# Patient Record
Sex: Female | Born: 1970 | Race: Black or African American | Hispanic: No | Marital: Single | Smoking: Never smoker
Health system: Southern US, Community
[De-identification: ages and names within clinical notes are randomized; demographics above are authoritative.]

## PROBLEM LIST (undated history)

## (undated) DIAGNOSIS — D649 Anemia, unspecified: Secondary | ICD-10-CM

## (undated) DIAGNOSIS — Z9851 Tubal ligation status: Secondary | ICD-10-CM

## (undated) DIAGNOSIS — M549 Dorsalgia, unspecified: Secondary | ICD-10-CM

## (undated) DIAGNOSIS — M25569 Pain in unspecified knee: Secondary | ICD-10-CM

## (undated) DIAGNOSIS — G43909 Migraine, unspecified, not intractable, without status migrainosus: Secondary | ICD-10-CM

## (undated) DIAGNOSIS — Z9889 Other specified postprocedural states: Secondary | ICD-10-CM

## (undated) HISTORY — DX: Migraine, unspecified, not intractable, without status migrainosus: G43.909

## (undated) HISTORY — PX: TUBAL LIGATION: SHX77

## (undated) HISTORY — DX: Anemia, unspecified: D64.9

## (undated) HISTORY — DX: Pain in unspecified knee: M25.569

## (undated) HISTORY — DX: Other specified postprocedural states: Z98.890

## (undated) HISTORY — DX: Tubal ligation status: Z98.51

## (undated) HISTORY — PX: BREAST SURGERY: SHX581

---

## 2003-09-30 ENCOUNTER — Ambulatory Visit (HOSPITAL_COMMUNITY): Admission: RE | Admit: 2003-09-30 | Discharge: 2003-09-30 | Payer: Self-pay | Admitting: Obstetrics and Gynecology

## 2003-11-15 ENCOUNTER — Ambulatory Visit (HOSPITAL_COMMUNITY): Admission: RE | Admit: 2003-11-15 | Discharge: 2003-11-15 | Payer: Self-pay | Admitting: Obstetrics and Gynecology

## 2003-11-22 ENCOUNTER — Inpatient Hospital Stay (HOSPITAL_COMMUNITY): Admission: AD | Admit: 2003-11-22 | Discharge: 2003-11-27 | Payer: Self-pay | Admitting: Obstetrics & Gynecology

## 2003-11-24 ENCOUNTER — Encounter (INDEPENDENT_AMBULATORY_CARE_PROVIDER_SITE_OTHER): Payer: Self-pay | Admitting: *Deleted

## 2003-11-28 ENCOUNTER — Encounter: Admission: RE | Admit: 2003-11-28 | Discharge: 2003-12-28 | Payer: Self-pay | Admitting: Obstetrics & Gynecology

## 2004-01-10 ENCOUNTER — Other Ambulatory Visit: Admission: RE | Admit: 2004-01-10 | Discharge: 2004-01-10 | Payer: Self-pay | Admitting: Obstetrics & Gynecology

## 2005-03-08 ENCOUNTER — Emergency Department (HOSPITAL_COMMUNITY): Admission: EM | Admit: 2005-03-08 | Discharge: 2005-03-08 | Payer: Self-pay | Admitting: Emergency Medicine

## 2005-04-20 ENCOUNTER — Ambulatory Visit (HOSPITAL_COMMUNITY): Admission: RE | Admit: 2005-04-20 | Discharge: 2005-04-20 | Payer: Self-pay | Admitting: Obstetrics & Gynecology

## 2006-12-23 ENCOUNTER — Emergency Department (HOSPITAL_COMMUNITY): Admission: EM | Admit: 2006-12-23 | Discharge: 2006-12-23 | Payer: Self-pay | Admitting: Emergency Medicine

## 2006-12-24 ENCOUNTER — Ambulatory Visit: Payer: Self-pay | Admitting: Cardiology

## 2007-02-17 ENCOUNTER — Encounter (INDEPENDENT_AMBULATORY_CARE_PROVIDER_SITE_OTHER): Payer: Self-pay | Admitting: Specialist

## 2007-02-17 ENCOUNTER — Ambulatory Visit (HOSPITAL_BASED_OUTPATIENT_CLINIC_OR_DEPARTMENT_OTHER): Admission: RE | Admit: 2007-02-17 | Discharge: 2007-02-18 | Payer: Self-pay | Admitting: Specialist

## 2008-02-09 ENCOUNTER — Emergency Department (HOSPITAL_COMMUNITY): Admission: EM | Admit: 2008-02-09 | Discharge: 2008-02-10 | Payer: Self-pay | Admitting: Emergency Medicine

## 2008-03-09 ENCOUNTER — Ambulatory Visit: Payer: Self-pay | Admitting: Gastroenterology

## 2009-02-25 ENCOUNTER — Ambulatory Visit (HOSPITAL_COMMUNITY): Admission: RE | Admit: 2009-02-25 | Discharge: 2009-02-25 | Payer: Self-pay | Admitting: Obstetrics & Gynecology

## 2010-06-27 LAB — CBC
HCT: 35.7 % — ABNORMAL LOW (ref 36.0–46.0)
Hemoglobin: 11.4 g/dL — ABNORMAL LOW (ref 12.0–15.0)
MCHC: 32 g/dL (ref 30.0–36.0)
MCV: 77 fL — ABNORMAL LOW (ref 78.0–100.0)
Platelets: 309 10*3/uL (ref 150–400)
RBC: 4.64 MIL/uL (ref 3.87–5.11)
RDW: 15.8 % — ABNORMAL HIGH (ref 11.5–15.5)
WBC: 8.4 10*3/uL (ref 4.0–10.5)

## 2010-06-27 LAB — PREGNANCY, URINE: Preg Test, Ur: NEGATIVE

## 2010-08-08 NOTE — Op Note (Signed)
NAMEVIRGIE, Christina Sparks               ACCOUNT NO.:  [id number]   MEDICAL RECORD NO.:  [id number]          PATIENT TYPE:  AMB   LOCATION:  DSC                          FACILITY:  [facility]   PHYSICIAN:  Earvin Hansen L. Truesdale, M.D.DATE OF BIRTH:  28-Oct-1970   DATE OF PROCEDURE:  02/17/2007  DATE OF DISCHARGE:                               OPERATIVE REPORT   A 36-year lady with severe severe macromastia, back and  shoulder pain  secondary to large pendulous breasts, increased accessory breast tissue,  aberrant tissue on the right and left axillary and latissimus dorsi  areas and serratus anterior, all causing intertriginous changes and  discomfort.   PROCEDURES PLANNED:  Bilateral breast reductions using the inferior  pedicle technique,  excision of accessory breast tissue.   ANESTHESIA:  General.   The patient underwent general anesthesia and intubated orally after she  had been set up and drawn for the inferior pedicle reduction  mammoplasty, remarking the nipple-areolar complexes back up to 25 cm  from the suprasternal notch where they measured preoperatively 40-41.  After being intubated, a prep was done to the breast, chest areas and  abdomen using Hibiclens soap and solution and walled off with sterile  towels and drapes so as to make a sterile field.  One-quarter percent  Xylocaine 1:400,000 concentration was used to locally inject for  vasoconstriction, a total of 150 mL per side.  The wounds were scored  with a #15 blade and the skin of the inferior pedicle was de-  epithelialized with #20 blades.  Medial and lateral fatty dermal  pedicles were incised down to underlying pectoralis major fascia.  Hemostasis was maintained with the Bovie unit of coagulation.  Next the  key hole area was also debulked and out laterally tremendous amounts of  aberrant accessory breast tissue was excised directly in the areas  mentioned in the preoperative evaluation.  The wounds were then examined  and  Hemostasis maintained with the Bovie unit of coagulation.  The flaps  were then stayed with 3-0 Prolene suture, subcutaneous closure was done  with 3-0 Monocryl x2 layers and then running subcuticular stitches of 3-  0 Monocryl and 5-0 Monocryl throughout the inverted T. The wounds were  drained with #10 fully fluted Blake drains which were placed in the  depths of the wound and brought out through the lateral-most portion of  the incision and secured with 3-0 Prolene. The wounds were cleansed.  Examination of the nipple-areolar complexes were done showing good blood  supply and color. Steri-Strips and soft dressings were applied including  Xeroform, 4x4s, ABDs, and Hypafix tape.  She withstood the procedure  very well.   ESTIMATED BLOOD LOSS:  Less than 150 mL.   COMPLICATIONS:  None.      Yaakov Guthrie. Shon Hough, M.D.  Electronically Signed     GLT/MEDQ  D:  02/17/2007  T:  02/17/2007  Job:  [id number]

## 2010-08-08 NOTE — Assessment & Plan Note (Signed)
[facility]                            CARDIOLOGY OFFICE NOTE   NAME:Christina Sparks, Christina Sparks                      MRN:          [id number]  DATE:12/24/2006                            DOB:          03/12/1971    Ms. Christina Sparks is a 40 year old single or divorced white female who comes  today after being in the emergency room yesterday with chest pain,  dizziness and a headache.   This has been episodic and has been going on for 4 months.  Her  discomfort in her chest is described as a heaviness.  It is not exertion  related.  Sometimes she feels a little bit of tingling down into her  left arm.   Sometimes she has a little bit of dizziness, this is episodic as well.  No true vertigo.  She has had a dull aching headache at times.   She is concerned that it is coronary because of her family history of  premature heart disease.  When she is questioned today it is really  mostly in second degree relatives and not first degree relatives.  Her  father did have an MI and did have some coronary disease in his 30s.   She is not diabetic, she does not have hypertension.  She is overweight.  She does not know her lipid status.  She does not smoke.   Her laboratory data in the emergency room showed a normal chemistry  profile including a normal blood sugar, normal point of care markers,  negative d-dimer, negative chest x-ray and a normal EKG.  She was  discharged home.   PAST MEDICAL HISTORY:  1. NO DYE ALLERGY.  2. She is currently on no medications.   She does not smoke, drink, use any recreational products.  She drinks  very little caffeine.  She has had no surgery.   FAMILY HISTORY:  Negative for premature heart disease.   SOCIAL HISTORY:  Is a Emergency planning/management officer.  She says she has a good job,  just bought a house.  She is not in the [employer].  She has 3  children, one of which is with her today.  She says her stress level has  been adequate and not  over stressed.   REVIEW OF SYSTEMS:  Negative other than just chronic fatigue and  depression.   EXAMINATION:  She is very pleasant.  She is alert and oriented.  Blood  pressure is 112/74, pulse 84 and regular, she weight 271, she is 5 foot  7-1/2.  She is in no acute distress.  SKIN:  Warm and dry.  HEENT:  Normocephalic/atraumatic, PERRLA, extraocular movements intact,  sclerae are clear, her facial symmetry is normal.  Carotid upstrokes  were equal bilaterally without bruits, no JVD, thyroid is not enlarged,  trachea is midline.  LUNGS:  Clear.  HEART:  Reveals a nondisplaced PMI, has normal S1 and S2 without murmur,  rub or gallop.  ABDOMINAL:  Obese with good bowel sounds, organomegaly could not be  assessed.  EXTREMITIES:  Reveal no cyanosis, clubbing or edema.  Peripheral pulses  are  intact.  NEURO:  Grossly intact.   EKG is completely normal.   ASSESSMENT:  1. Noncardiac chest discomfort, dizziness.  2. Obesity.  3. Question lipid status.   She would like me to check fasting lipids.  We will put her in the  computer for this.  I have advised her that if she continues to have  symptoms, particularly headaches, she needs to get this further  evaluated either going back to the emergency room, establishment with a  primary care physician or neurologist.     Jesse Sans. Daleen Squibb, MD, [facility]  Electronically Signed    TCW/MedQ  DD: 12/24/2006  DT: 12/25/2006  Job #: [id number]   cc:   Doug Sou, M.D.

## 2010-08-08 NOTE — Assessment & Plan Note (Signed)
[facility]                                 ON-CALL NOTE   NAME:Szilagyi, Christina Sparks                      MRN:          [id number]  DATE:12/23/2006                            DOB:          24-Jan-1971    PHONE NUMBER:  [phone].   Dr. Ethelda Chick called today noting that we were on call for the  emergency room.  He is seeing a young woman with some chest discomfort.  He feels that it is most likely not cardiac, and that she does not need  to stay in the emergency room.  He feels that she does need cardiology  assessment in followup as an outpatient.  We will call the patient and  arrange for an outpatient cardiac evaluation.     Luis Abed, MD, [facility]  Electronically Signed    JDK/MedQ  DD: 12/23/2006  DT: 12/23/2006  Job #: [id number]

## 2010-08-11 NOTE — Op Note (Signed)
NAME:  KRISS, PERLEBERG NO.:  [id number]   MEDICAL RECORD NO.:  [id number]          PATIENT TYPE:  AMB   LOCATION:  SDC                           [facility]   PHYSICIAN:  Roseanna Rainbow, M.D.DATE OF BIRTH:  11-Nov-1970   DATE OF PROCEDURE:  04/20/2005  DATE OF DISCHARGE:                                 OPERATIVE REPORT   PREOPERATIVE DIAGNOSIS:  Multiparity, desires a sterilization procedure.   POSTOPERATIVE DIAGNOSIS:  Multiparity, desires a sterilization procedure.   PROCEDURE:  Laparoscopic bilateral tubal ligation with bipolar cautery.   SURGEON:  Roseanna Rainbow, M.D.   ANESTHESIA:  General endotracheal.   ESTIMATED BLOOD LOSS:  Minimal.   COMPLICATIONS:  None.   IV FLUIDS:  As per anesthesiology.   URINE OUTPUT:  100 mL clear urine at the beginning of the procedure.   PROCEDURE:  The patient was taken to the operating room.  General anesthesia  was administered without difficulty.  She was placed in the dorsal lithotomy  position and prepped and draped in the usual sterile fashion.  A bivalve  speculum was placed in the patient's vagina.  A single-tooth tenaculum was  applied to the anterior lip of the cervix.  A Hulka manipulator was then  advanced into the uterus and secured to the anterior lip of the cervix as a  means to manipulate the uterus.  The speculum and single-tooth tenaculum was  then removed.  An infraumbilical skin incision was then made with the  scalpel and carried down to the underlying fascia.  The fascia was incised  and the fascial incision was extended.  The posterior rectus sheath and  parietal peritoneum were tented up and entered sharply.  This incision again  was extended.  Stay sutures were placed using of Vicryl in the fascia.  The  Hasson trocar and sleeve were then advanced into the abdomen.  The abdomen  was then insufflated with CO2 gas.  A survey of the pelvic anatomy was  remarkable for filmy  adhesions involving the omentum to the anterior uterine  wall.  The midisthmic portion of the left tube was then cauterized  contiguously.  A 2 cm segment of tube was cauterized.  With each  application, the ohmmeter was noted to go to 0.  The right fallopian tube  was manipulated in a similar fashion.  The instruments were removed from the  abdomen.  The fascial incision was reapproximated with  0 Vicryl.  The skin was reapproximated with 3-0 Vicryl and Dermabond.  The  Hulka manipulator was removed with minimal bleeding noted from the cervix.  At the close of the procedure, the instrument and pack counts were said to  be correct x2.  The patient was taken to the PACU awake and in stable  condition.      Roseanna Rainbow, M.D.  Electronically Signed     LAJ/MEDQ  D:  04/20/2005  T:  04/20/2005  Job:  [id number]

## 2010-08-11 NOTE — Op Note (Signed)
NAME:  Christina Sparks, Christina Sparks                         ACCOUNT NO.:  [id number]   MEDICAL RECORD NO.:  [id number]                   PATIENT TYPE:  INP   LOCATION:  9152                                 [facility]ACILITY:  [facility]   PHYSICIAN:  Gerrit Friends. Aldona Bar, M.D.                DATE OF BIRTH:  06/18/1970   DATE OF PROCEDURE:  11/24/2003  DATE OF DISCHARGE:                                 OPERATIVE REPORT   PREOPERATIVE DIAGNOSES:  1.  Severe preeclampsia.  2.  A 30-week intrauterine pregnancy.  3.  Abnormal Doppler flow study.  4.  Nonreassuring fetal heart tracing.   POSTOPERATIVE DIAGNOSES:  1.  Severe preeclampsia.  2.  A 30-week intrauterine pregnancy.  3.  Abnormal Doppler flow study.  4.  Nonreassuring fetal heart tracing.   1.  Delivery of 2 pound 8 ounce female infant with Apgars 4 and 7 and arterial      cord pH 7.27.   PROCEDURE:  Primary low transverse cesarean section.   SURGEON:  Gerrit Friends. Aldona Bar, M.D.   ANESTHESIA:  Subarachnoid block per Quillian Quince, M.D.   INDICATIONS FOR PROCEDURE:  This 40 year old gravida 3, para 2 was admitted  on November 22, 2003, with headache and elevated blood pressure and a  complaint of decreased fetal movement.  During the course of her  hospitalization, her blood pressures stabilized.  Her headache was treated  and resolved.  She had a biophysical profile that was 8 of 8 and she had a  fetal tracing that was somewhat reassuring but intermittently she did have  significant variable decelerations.   On the evening of November 23, 2003, Doppler studies were done and revealed no  end diastolic flow in the uterine artery and the middle cerebral artery  was  less than its normal value as well.  A 24-hour creatinine clearance and  total urine protein was sent on November 23, 2003, and preoperatively the  value of her total urinary protein was 2.3 g.  When I saw the patient at  7:30 on the morning of November 24, 2003, all these factors were noted.  Dr.  Gavin Potters was consulted and agreed with immanent delivery even though the  patient had received only one dose of betamethasone on the evening of November 23, 2003.  Accordingly, the patient was taken to the operating room now for  delivery.   The patient's platelet count was checked again.  It was found to be normal  and once in the operating room, a subarachnoid block was carried out by Dr.  Arby Barrette without difficulty.   At this time, the patient was prepped and draped having been placed in the  supine position slightly tilted to the left and a Foley catheter was placed  as part of the prep.  After good anesthetic levels were documented with the  patient adequately draped, the procedure was begun.  A Pfannenstiel incision  was made and with minimal difficulty, dissected down sharply to and through  the fascia with hemostasis created at each layer.  Subfascial space was  created inferiorly and superiorly.  Muscles separated in the midline.  Peritoneum identified and entered appropriately with care taken to avoid the  bowel superiorly and the bladder inferiorly.  At this time, the  vesicouterine peritoneum was incised in a low transverse fashion and pushed  off the lower uterine segment.  A sharp incision to the uterus in the low  transverse fashion was then made and extended laterally - incision was made  with Metzenbaum scissors.  Amniotomy was produced.  Placenta was encountered  as it was anterior as well.  After amniotomy was carried out, both feet of  the fetus were grasped - infant was known to be breech, and using the  modified Lovset maneuver, infant was delivered from a footling breech  presentation.  The cord was clamped and cut and the infant was passed off to  the awaiting team.  Subsequent weight found to be 2 pounds 8 ounces and  Apgars were assigned at 4 and 7.  An arterial cord pH was done and returned  at 7.27.   An attempt was made to obtain normal cord bloods but there  was no blood in  the cord to allow this.  At this time, the placenta was delivered and again  no blood could be obtained from the placenta either.  The placenta was sent  to pathology and appropriately labeled.   At this time the uterus was exteriorized and manually rendered free of any  remaining products of conception and with good uterine contractility  afforded, was slowly given intravenous Pitocin and manual stimulation.  Closure of the uterus was begun in layers.  The #1 Vicryl in running locking  fashion was used and thereafter several figure-of-eight #1 Vicryls were  placed for additional hemostasis.  The uterine incision appeared dry. The  bladder flap was reapproximated with interrupted 0 Vicryl.  Tubes and  ovaries were inspected and noted to be normal.  The uterus was well  contracted.  At this time, after the abdomen was lavaged of all free blood  and clot, the uterus was replaced in the abdominal cavity.  All counts were  noted to be correct at this point and no foreign body was noted to be  remaining in the abdominal cavity and accordingly, closure of the abdomen  was carried out at this time.  The abdominal peritoneum was closed with 0  Vicryl in a running fashion and muscle secured with same.  Assured of good  subfascial hemostasis, as mentioned the fascia was closed with 0 Vicryl from  angle to midline bilaterally.  Subcutaneous tissue was rendered hemostatic.  Staples were then used to close the skin.  A sterile compressive dressing  was applied and the patient, at this time, was transported to the recovery  room in satisfactory condition having tolerated the procedure well.  The  infant was transported to the newborn intensive care unit.  Mother was  transported to the recovery room.  At the conclusion of the procedure, both  were stable in their respective recovery areas.  SUMMARY:  This patient was delivered at [redacted] weeks gestation because of severe  preeclampsia,  abnormal Doppler studies and a nonreassuring fetal heart  tracing in conjunction with a 24-hour urine total protein that was 2.3 g.  She was delivered of a 2 pound 8 ounce female infant with  Apgars of 4 and 7.                                               Gerrit Friends. Aldona Bar, M.D.    RMW/MEDQ  D:  11/24/2003  T:  11/24/2003  Job:  [id number]

## 2010-08-11 NOTE — Discharge Summary (Signed)
NAME:  Christina Sparks, CLOUATRE NO.:  [id number]   MEDICAL RECORD NO.:  [id number]          PATIENT TYPE:  INP   LOCATION:  9109                          [facility]ACILITY:  [facility]   PHYSICIAN:  Miguel Aschoff, M.D.       DATE OF BIRTH:  April 14, 1970   DATE OF ADMISSION:  11/22/2003  DATE OF DISCHARGE:  11/27/2003                                 DISCHARGE SUMMARY   FINAL DIAGNOSES:  1.  Intrauterine pregnancy at [redacted] weeks gestation.  2.  Severe preeclampsia.  3.  Abnormal Doppler flow studies.  4.  Nonreassuring fetal heart tracing.   PROCEDURE:  Primary low transverse cesarean section.  Surgeon:  Dr. Annamaria Helling.   This 40 year old G3 P2 presents at [redacted] weeks gestation on November 22, 2003 at  the office with headaches moderate to severe and a complaint of decreased  fetal movement.  In the office, blood pressure was 140/90, there was 3+  protein in the urine.  The patient was sent immediately to triage for  observation.  Her cervix was 0/0 at this point and fetal heart tones were  nonreactive.  Blood pressure in triage was about 160/90.  There was a BPP  performed that was 8/8 and PIH labs were normal.  There was a breech  presentation as well.  During her hospital course her blood pressure  stabilized.  Labs were watched.  On the evening of August 30, Doppler  studies were performed and revealed no end diastolic flow into the uterine  artery and the middle cerebral artery was less than its normal value.  A 24-  hour creatinine clearance and total urine protein was sent and total protein  of 2.3 g.  On the morning of August 31, Dr. Aldona Bar saw the patient, Dr.  Gavin Potters was consulted, and agreed with an imminent delivery even though the  patient had only had one dose of betamethasone.  The patient's platelet  count was checked at this point, found to be normal, and therefore was able  to be taken to the operating room for a cesarean section.  The patient's  antepartum course up to this point  had really been uncomplicated up to this  30-week period.  Actually, on August 22 she was complaining of some swelling  and increased pressure and decreased fetal movement.  She was sent to  [facility] at that time where NST was reactive and so was a BPP with  8/8.  She was resting at this time.  When she returned the next week was  when the patient was actually hospitalized.  The patient was taken to the  operating room on November 24, 2003 by Dr. Annamaria Helling where a primary low  transverse cesarean section was performed with delivery of a 2-pound 8-ounce  female infant with Apgars of 4 and 7.  There was an arterial cord pH of 7.27.  The delivery went without complication.  The patient was, of course, taken  to the ICU because she was on her magnesium sulfate.  The baby was in the  NICU on CPAP but  doing well.  By postoperative day #1 the patient was  feeling better.  She was not having any headaches or any problems.  Her  magnesium sulfate was able to be stopped and she was taken to the regular  mother-baby unit.  Blood pressures continued to be elevated and the patient  was started on some labetalol for her blood pressures.  She was felt ready  for discharge on postoperative day #3.  She was to take labetalol twice  daily for her blood pressures, Tylox one q.3h. as needed for pain, told she  could use over-the-counter Motrin as needed, was asked to follow up  in the  office in 2 weeks for a blood pressure check, of course was to call with any  increased pain or bleeding.   LABORATORY DATA ON DISCHARGE:  The patient had a hemoglobin of 10.6; white  blood cell count of 20.8; platelets of 229,000.  Normal PIH labs.      MB/MEDQ  D:  12/21/2003  T:  12/21/2003  Job:  [id number]

## 2010-10-16 ENCOUNTER — Other Ambulatory Visit: Payer: Self-pay | Admitting: Obstetrics

## 2010-12-26 LAB — URINALYSIS, ROUTINE W REFLEX MICROSCOPIC
Bilirubin Urine: NEGATIVE
Glucose, UA: NEGATIVE
Hgb urine dipstick: NEGATIVE
Ketones, ur: NEGATIVE
Nitrite: NEGATIVE
Protein, ur: NEGATIVE
Specific Gravity, Urine: 1.023
Urobilinogen, UA: 1
pH: 7

## 2010-12-26 LAB — CBC
HCT: 35.6 — ABNORMAL LOW
Hemoglobin: 11.3 — ABNORMAL LOW
MCHC: 31.9
MCV: 75.6 — ABNORMAL LOW
Platelets: 306
RBC: 4.7
RDW: 15.1
WBC: 10.2

## 2010-12-26 LAB — DIFFERENTIAL
Basophils Absolute: 0.1
Basophils Relative: 1
Eosinophils Absolute: 0.1
Eosinophils Relative: 1
Lymphocytes Relative: 41
Lymphs Abs: 4.2 — ABNORMAL HIGH
Monocytes Absolute: 0.8
Monocytes Relative: 8
Neutro Abs: 5
Neutrophils Relative %: 49

## 2010-12-26 LAB — POCT I-STAT, CHEM 8
BUN: 5 — ABNORMAL LOW
Calcium, Ion: 1.21
Chloride: 103
Creatinine, Ser: 0.9
Glucose, Bld: 92
HCT: 37
Hemoglobin: 12.6
Potassium: 4
Sodium: 140
TCO2: 28

## 2010-12-26 LAB — POCT CARDIAC MARKERS
CKMB, poc: <1 — ABNORMAL LOW
Myoglobin, poc: 51.5
Troponin i, poc: <0.05

## 2010-12-26 LAB — PREGNANCY, URINE: Preg Test, Ur: NEGATIVE

## 2011-01-02 LAB — POCT HEMOGLOBIN-HEMACUE
Hemoglobin: 11.1 — ABNORMAL LOW
Operator id: 116011

## 2011-01-04 LAB — I-STAT 8, (EC8 V) (CONVERTED LAB)
Acid-Base Excess: 1
BUN: 7
Bicarbonate: 27.4 — ABNORMAL HIGH
Chloride: 103
Glucose, Bld: 102 — ABNORMAL HIGH
HCT: 39
Hemoglobin: 13.3
Operator id: 114141
Potassium: 3.9
Sodium: 138
TCO2: 29
pCO2, Ven: 49.6
pH, Ven: 7.35 — ABNORMAL HIGH

## 2011-01-04 LAB — POCT I-STAT CREATININE
Creatinine, Ser: 0.7
Operator id: 114141

## 2011-01-04 LAB — POCT CARDIAC MARKERS
CKMB, poc: <1 — ABNORMAL LOW
Myoglobin, poc: 66.1
Operator id: 114141
Troponin i, poc: <0.05

## 2011-01-04 LAB — D-DIMER, QUANTITATIVE (NOT AT ARMC): D-Dimer, Quant: 0.28

## 2011-04-14 ENCOUNTER — Encounter (HOSPITAL_COMMUNITY): Payer: Self-pay

## 2011-04-14 ENCOUNTER — Emergency Department (HOSPITAL_COMMUNITY)
Admission: EM | Admit: 2011-04-14 | Discharge: 2011-04-14 | Disposition: A | Discharge status: Home / Self Care | Payer: BC Managed Care – HMO | Attending: Emergency Medicine | Admitting: Emergency Medicine

## 2011-04-14 ENCOUNTER — Emergency Department (HOSPITAL_COMMUNITY): Payer: BC Managed Care – HMO

## 2011-04-14 ENCOUNTER — Other Ambulatory Visit: Payer: Self-pay

## 2011-04-14 DIAGNOSIS — R079 Chest pain, unspecified: Secondary | ICD-10-CM | POA: Insufficient documentation

## 2011-04-14 DIAGNOSIS — R0602 Shortness of breath: Secondary | ICD-10-CM | POA: Insufficient documentation

## 2011-04-14 DIAGNOSIS — D649 Anemia, unspecified: Secondary | ICD-10-CM

## 2011-04-14 DIAGNOSIS — R51 Headache: Secondary | ICD-10-CM

## 2011-04-14 HISTORY — DX: Dorsalgia, unspecified: M54.9

## 2011-04-14 LAB — POCT I-STAT, CHEM 8
BUN: 7 mg/dL (ref 6–23)
Calcium, Ion: 1.25 mmol/L (ref 1.12–1.32)
Chloride: 103 mEq/L (ref 96–112)
Creatinine, Ser: 0.8 mg/dL (ref 0.50–1.10)
Glucose, Bld: 99 mg/dL (ref 70–99)
HCT: 32 % — ABNORMAL LOW (ref 36.0–46.0)
Hemoglobin: 10.9 g/dL — ABNORMAL LOW (ref 12.0–15.0)
Potassium: 4.5 mEq/L (ref 3.5–5.1)
Sodium: 141 mEq/L (ref 135–145)
TCO2: 28 mmol/L (ref 0–100)

## 2011-04-14 LAB — D-DIMER, QUANTITATIVE: D-Dimer, Quant: 1.54 ug/mL-FEU — ABNORMAL HIGH (ref 0.00–0.48)

## 2011-04-14 MED ORDER — OXYCODONE-ACETAMINOPHEN 5-325 MG PO TABS
1.0000 | ORAL_TABLET | Freq: Once | ORAL | Status: AC
  Administered 2011-04-14: 1 via ORAL

## 2011-04-14 MED ORDER — KETOROLAC TROMETHAMINE 30 MG/ML IJ SOLN
30.0000 mg | Freq: Once | INTRAMUSCULAR | Status: AC
  Administered 2011-04-14: 30 mg via INTRAVENOUS
  Filled 2011-04-14: qty 1

## 2011-04-14 MED ORDER — METHYLPREDNISOLONE SODIUM SUCC 125 MG IJ SOLR
125.0000 mg | Freq: Once | INTRAMUSCULAR | Status: AC
  Administered 2011-04-14: 125 mg via INTRAVENOUS
  Filled 2011-04-14: qty 2

## 2011-04-14 MED ORDER — OXYCODONE-ACETAMINOPHEN 5-325 MG PO TABS
1.0000 | ORAL_TABLET | Freq: Once | ORAL | Status: AC
  Administered 2011-04-14: 1 via ORAL
  Filled 2011-04-14: qty 2

## 2011-04-14 MED ORDER — SODIUM CHLORIDE 0.9 % IV BOLUS (SEPSIS)
1000.0000 mL | Freq: Once | INTRAVENOUS | Status: AC
  Administered 2011-04-14: 1000 mL via INTRAVENOUS

## 2011-04-14 MED ORDER — DIPHENHYDRAMINE HCL 50 MG/ML IJ SOLN
25.0000 mg | Freq: Once | INTRAMUSCULAR | Status: AC
  Administered 2011-04-14: 25 mg via INTRAVENOUS
  Filled 2011-04-14: qty 1

## 2011-04-14 NOTE — ED Notes (Signed)
Pt here with c/o chest tightness since this am with sob and HA, pt states she states she has sensation that her heart is fluttering and when she takes deep breathes and calms down it is followed by increase in headache.

## 2011-04-14 NOTE — ED Provider Notes (Signed)
History     CSN: [id number]  Arrival date & time 04/14/11  1813   First MD Initiated Contact with Patient 04/14/11 1850      Chief Complaint  Patient presents with  . Chest Pain  . Shortness of Breath  . Headache    (Consider location/radiation/quality/duration/timing/severity/associated sxs/prior treatment) HPI  Patient complaining of headache for two days bifrontal pounding from both temple up.  Pain 7-8/10.  Worsens with light, nausea but no vomiting.  Patient has headaches on a regular basis.  Took ibuprofen without relief.  Patient with sob that started today, no cough some tightness in chest, no tobacco use.  Patient states she has some fluttering in chest and feels like she is sob.    Past Medical History  Diagnosis Date  . Back pain     Past Surgical History  Procedure Date  . Cesarean section     History reviewed. No pertinent family history.  History  Substance Use Topics  . Smoking status: Not on file  . Smokeless tobacco: Not on file  . Alcohol Use: 1.2 oz/week    2 Glasses of wine per week     occasional    OB History    Grav Para Term Preterm Abortions TAB SAB Ect Mult Living                  Review of Systems  All other systems reviewed and are negative.    Allergies  Sulfa drugs cross reactors  Home Medications   Current Outpatient Rx  Name Route Sig Dispense Refill  . DICLOFENAC SODIUM 75 MG PO TBEC Oral Take 75 mg by mouth 2 (two) times daily.      BP 138/77  Pulse 81  Temp(Src) 97.9 F (36.6 C) (Oral)  Resp 18  Ht 5' 7.5" (1.715 m)  Wt 257 lb (116.574 kg)  BMI 39.66 kg/m2  SpO2 100%  LMP 04/08/2011  Physical Exam  Nursing note and vitals reviewed. Constitutional: She is oriented to person, place, and time. She appears well-developed and well-nourished.  HENT:  Head: Normocephalic and atraumatic.  Right Ear: External ear normal.  Left Ear: External ear normal.  Nose: Nose normal.  Mouth/Throat: Oropharynx is clear and  moist.  Eyes: Conjunctivae are normal. Pupils are equal, round, and reactive to light.  Neck: Normal range of motion. Neck supple.  Cardiovascular: Normal rate, regular rhythm, normal heart sounds and intact distal pulses.   Pulmonary/Chest: Effort normal and breath sounds normal.  Abdominal: Soft. Bowel sounds are normal.  Musculoskeletal: Normal range of motion.  Neurological: She is alert and oriented to person, place, and time. She has normal reflexes.  Skin: Skin is warm and dry.  Psychiatric: She has a normal mood and affect.    ED Course  Procedures (including critical care time)  Labs Reviewed - No data to display No results found.   No diagnosis found.  Date: 04/14/2011  Rate: 79  Rhythm: normal sinus rhythm  QRS Axis: normal  Intervals: normal  ST/T Wave abnormalities: normal  Conduction Disutrbances:none  Narrative Interpretation:   Old EKG Reviewed: none available     MDM  Patient with improvement of headache but not resolved.  Anemia noted and discussed with patient.  She is establishing primary care and will follow up.        Hilario Quarry, MD 04/14/11 [phone]

## 2011-04-14 NOTE — ED Notes (Signed)
PER md.  Give 2 tabs percocet

## 2011-06-01 ENCOUNTER — Encounter: Payer: Self-pay | Admitting: Family Medicine

## 2011-06-01 ENCOUNTER — Ambulatory Visit (INDEPENDENT_AMBULATORY_CARE_PROVIDER_SITE_OTHER): Payer: BC Managed Care – HMO | Admitting: Family Medicine

## 2011-06-01 DIAGNOSIS — M79609 Pain in unspecified limb: Secondary | ICD-10-CM

## 2011-06-01 DIAGNOSIS — G43909 Migraine, unspecified, not intractable, without status migrainosus: Secondary | ICD-10-CM

## 2011-06-01 DIAGNOSIS — M25569 Pain in unspecified knee: Secondary | ICD-10-CM

## 2011-06-01 DIAGNOSIS — E669 Obesity, unspecified: Secondary | ICD-10-CM

## 2011-06-01 DIAGNOSIS — D649 Anemia, unspecified: Secondary | ICD-10-CM

## 2011-06-01 DIAGNOSIS — M79673 Pain in unspecified foot: Secondary | ICD-10-CM

## 2011-06-01 MED ORDER — MULTIVITAMIN/IRON PO TABS: 1.0000 | ORAL_TABLET | Freq: Every day | ORAL | Status: DC

## 2011-06-01 NOTE — Patient Instructions (Addendum)
Please try building your quad muscles as discussed. My office will get in touch with you regarding a podiatrist. You may take up to 3200mg  of NSAIDs per day for your pain.   Please call [facility] to schedule a time to have your knee x-ray. We will follow up with this at your next appointment.   Please come back to see me at your earliest convenience to discuss your shortness of breath and palpitations.

## 2011-06-02 LAB — CBC
HCT: 32.4 % — ABNORMAL LOW (ref 36.0–46.0)
Hemoglobin: 9.7 g/dL — ABNORMAL LOW (ref 12.0–15.0)
MCH: 20.4 pg — ABNORMAL LOW (ref 26.0–34.0)
MCHC: 29.9 g/dL — ABNORMAL LOW (ref 30.0–36.0)
MCV: 68.2 fL — ABNORMAL LOW (ref 78.0–100.0)
Platelets: 384 10*3/uL (ref 150–400)
RBC: 4.75 MIL/uL (ref 3.87–5.11)
RDW: 16.4 % — ABNORMAL HIGH (ref 11.5–15.5)
WBC: 11.6 10*3/uL — ABNORMAL HIGH (ref 4.0–10.5)

## 2011-06-04 ENCOUNTER — Encounter: Payer: Self-pay | Admitting: Family Medicine

## 2011-06-04 DIAGNOSIS — G43909 Migraine, unspecified, not intractable, without status migrainosus: Secondary | ICD-10-CM | POA: Insufficient documentation

## 2011-06-04 DIAGNOSIS — E669 Obesity, unspecified: Secondary | ICD-10-CM | POA: Insufficient documentation

## 2011-06-04 NOTE — Assessment & Plan Note (Signed)
Pt to bring in Medication list of diet medications. Pt advised to take multidisciplined approach to wt loss. Will continue wt loss program at this time.

## 2011-06-04 NOTE — Assessment & Plan Note (Signed)
Likely still microcytic. CBC today. Concern for iron deficiency. Will start on multivitamin w/ iron. No apparent gross losses.

## 2011-06-04 NOTE — Progress Notes (Signed)
  Subjective:    Patient ID: Christina Sparks, female    DOB: [birth year], 41 y.o.   MRN: [id number]  HPI CC New pt., knee pain, anemia  Pt new to practice. 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 Pt has not been in to see a PCP for a number of years due to insurance. Continues to see her OB and another physician (Dr. Theodoro Kos) for weight loss. Pt now w/ insurance and would like to get her health back in order.   R Knee pain: Pain started about 6 months ago. Pain is sharp to aching and gets worse the more she walks on it. Pt states if feels like it needs to pop, and reports a grinding sensation. Pain improves when able to rest and elevate leg. Pt reports having foot pain approximately 1 year ago when she changed her footwear and thus has changed her gait. Pt now wears flats all the time. Pt has tried changing back to previous shoes but is now in too much pain. Pt gets some relief when taking anywhere from 600-1800mg  Ibuprofen at a time. Pt counseled on proper ibuprofen dosing and risks associated w/ excessive intake. Pain improved w/ knee brace provided by prior physician.  Foot Pain: Pt has tried multiple types of footwear since pain started last year, w/o relief. Ibuprofen as above. Pain is worse as the day progresses and the pt is on her feet more. Reports some change in sensation to entire plantar surface. Pt states arch has flattened. Requesting to see a podiatrist.   Anemia: Pt reports being told she was anemic at her last doctors visit. Records were reviewed w/ pt. Pt Hgb noted to be slowly declining and was 10.9 on 03/2011 in the ED. MCV noted to be 77 in 2010. Denies hematochezia, hematemesis, hematuria, easy bruising. Periods are regular every 28 days and reports minimal bleeding. Pt does not take a multivitamin and reports occasional dizziness when going from sitting/lying to standing quickly. Denies syncope. This has never been a problem for her before.   Weight gain / Obesity: Pt currently undergoing weight  loss program w/ Dr. Theodoro Kos. Pt unsure of all medications taking and encouraged to bring in list. Pt takes Phentermine 37.5mg  and Some form of HCG. Pt reports previous 60lb weight loss w/ regaining of the weight prior to starting program.   Review of Systems As above w/ the following additions:  Negative CP, n/v/d/c, dysuria, abd pain, fever, rash, hot flashes, change in appetite,   Positive: Occasional SOB, weight gain, HA, occasional palplitaitons.      Objective:   Physical Exam  Gen: Obese HEENT: mmm, PERRL CV: RRR, no m/r/g Res: CTAB Musc: Exam limited due to obesity. No R knee laxity on ant/post drawer examination. No crepitus w/ passive movement, Flattened arch of feet bilat.  Ext: 1+ edema of LE.  Neuro: CN grossly intact. Gait normal,       Assessment & Plan:   Pt to return w/in 1-2 wks for assessment of SOB and occasional palpitations. And then in another 1-2wks for assessment of migraines and hand numbness.

## 2011-06-04 NOTE — Assessment & Plan Note (Signed)
Likely multifactorial. Obesity as well as change in gate secondary to foot pain. Physical exam reassuring for no major structural abnormality though chronic meniscal damage likely. COntinue w/ knee brace. Instructed to perform quad strengthening exercises. Knee Xray. Will assess films and possibility for sportsmed/PT/Ortho referral at next visit.

## 2011-06-04 NOTE — Assessment & Plan Note (Signed)
Persistent foot pain likely multifactorial. Obesity and chang in footwear. Unlikely to be neuropathic in nature (DM or Vit def). Pt has tried various footwear. Requesting referral to podiatry. Will referr

## 2011-06-07 ENCOUNTER — Ambulatory Visit (HOSPITAL_COMMUNITY)
Admission: RE | Admit: 2011-06-07 | Discharge: 2011-06-07 | Disposition: A | Discharge status: Home / Self Care | Payer: BC Managed Care – HMO | Source: Ambulatory Visit | Attending: Family Medicine | Admitting: Family Medicine

## 2011-06-07 DIAGNOSIS — M898X9 Other specified disorders of bone, unspecified site: Secondary | ICD-10-CM | POA: Insufficient documentation

## 2011-06-07 DIAGNOSIS — M25469 Effusion, unspecified knee: Secondary | ICD-10-CM | POA: Insufficient documentation

## 2011-06-07 DIAGNOSIS — M25569 Pain in unspecified knee: Secondary | ICD-10-CM | POA: Insufficient documentation

## 2011-06-08 ENCOUNTER — Ambulatory Visit (INDEPENDENT_AMBULATORY_CARE_PROVIDER_SITE_OTHER): Payer: BC Managed Care – HMO | Admitting: Family Medicine

## 2011-06-08 ENCOUNTER — Encounter: Payer: Self-pay | Admitting: Family Medicine

## 2011-06-08 VITALS — BP 120/78 | HR 94 | Temp 98.2°F | Ht 67.25 in | Wt 266.0 lb

## 2011-06-08 DIAGNOSIS — M1710 Unilateral primary osteoarthritis, unspecified knee: Secondary | ICD-10-CM

## 2011-06-08 DIAGNOSIS — IMO0002 Reserved for concepts with insufficient information to code with codable children: Secondary | ICD-10-CM

## 2011-06-08 DIAGNOSIS — M1711 Unilateral primary osteoarthritis, right knee: Secondary | ICD-10-CM

## 2011-06-08 NOTE — Patient Instructions (Signed)
Wear and Tear Disorders of the Knee (Arthritis, Osteoarthritis)  Everyone will experience wear and tear injuries (arthritis, osteoarthritis) of the knee. These are the changes we all get as we age. They come from the joint stress of daily living. The amount of cartilage damage in your knee and your symptoms determine if you need surgery. Mild problems require approximately two months recovery time. More severe problems take several months to recover. With mild problems, your surgeon may find worn and rough cartilage surfaces. With severe changes, your surgeon may find cartilage that has completely worn away and exposed the bone. Loose bodies of bone and cartilage, bone spurs (excess bone growth), and injuries to the menisci (cushions between the large bones of your leg) are also common. All of these problems can cause pain.  For a mild wear and tear problem, rough cartilage may simply need to be shaved and smoothed. For more severe problems with areas of exposed bone, your surgeon may use an instrument for roughing up the bone surfaces to stimulate new cartilage growth. Loose bodies are usually removed. Torn menisci may be trimmed or repaired.  ABOUT THE ARTHROSCOPIC PROCEDURE  Arthroscopy is a surgical technique. It allows your orthopedic surgeon to diagnose and treat your knee injury with accuracy. The surgeon looks into your knee through a small scope. The scope is like a small (pencil-sized) telescope. Arthroscopy is less invasive than open knee surgery. You can expect a more rapid recovery. After the procedure, you will be moved to a recovery area until most of the effects of the medication have worn off. Your caregiver will discuss the test results with you.  RECOVERY   The severity of the arthritis and the type of procedure performed will determine recovery time. Other important factors include age, physical condition, medical conditions, and the type of rehabilitation program. Strengthening your muscles after arthroscopy helps guarantee a better recovery. Follow your caregiver's instructions. Use crutches, rest, elevate, ice, and do knee exercises as instructed. Your caregivers will help you and instruct you with exercises and other physical therapy required to regain your mobility, muscle strength, and functioning following surgery. Only take over-the-counter or prescription medicines for pain, discomfort, or fever as directed by your caregiver.    SEEK MEDICAL CARE IF:     There is increased bleeding (more than a small spot) from the wound.   You notice redness, swelling, or increasing pain in the wound.   Pus is coming from wound.   You develop an unexplained oral temperature above 102 F (38.9 C) , or as your caregiver suggests.   You notice a foul smell coming from the wound or dressing.   You have severe pain with motion of the knee.  SEEK IMMEDIATE MEDICAL CARE IF:     You develop a rash.   You have difficulty breathing.   You have any allergic problems.  MAKE SURE YOU:     Understand these instructions.   Will watch your condition.   Will get help right away if you are not doing well or get worse.  Document Released: 03/09/2000 Document Revised: 03/01/2011 Document Reviewed: 08/06/2007  [employer] Patient Information 2012 [employer].

## 2011-06-08 NOTE — Progress Notes (Signed)
  Subjective:    Patient ID: Christina Sparks, female    DOB: 09-02-1970, 41 y.o.   MRN: [id number]  HPI 1. Right knee pain Patient has chronic pain in right knee, worse with weight bearing. No locking or catching. She is morbidly obese. She has xray showing small effusion and spurs at the pateller femoral juntcion. She has no other arthropathies/rash. Patient has never had knee injected. She has been wearing brace and taking ibuprofen. She describes a twisting injury some time ago.  Review of Systems Pertinent items are noted in HPI. No fever, chills, night sweats, weight loss. No tick bites.     Objective:   Physical Exam Filed Vitals:   06/08/11 1023  BP: 120/78  Pulse: 94  Temp: 98.2 F (36.8 C)  TempSrc: Oral  Height: 5' 7.25" (1.708 m)  Weight: 266 lb (120.657 kg)  Right Knee:  negative drawer signs.  TTP medial and lateral of patella. petaller crepitus with movement. Negative apprehension test. No obvious effusion. Gait normal Morbid obesity.     Assessment & Plan:

## 2011-06-08 NOTE — Progress Notes (Signed)
Procedure Note: Right knee steroid and lidocaine injection  Informed Consent Obtained for above. All Risks, benefits, alternative, complications reviewed with patient and understood. Time Out performed. Using topical spray, local anesthesia was obtained. Using a 25 gauge needle, 4:1 kenalog/lidocaine was injected easily into knee joint.  The patient tolerated the procedure well. No complications occurred. Patient counseled on post-operative care.

## 2011-06-08 NOTE — Assessment & Plan Note (Signed)
Injected with kenalog and lidocaine.  F/u with PCP Advised Rest, Ice 20 minutes twice a day, and NSAIDS daily.

## 2011-06-21 ENCOUNTER — Ambulatory Visit: Payer: BC Managed Care – HMO | Admitting: Family Medicine

## 2011-07-27 ENCOUNTER — Ambulatory Visit: Payer: BC Managed Care – HMO | Admitting: Family Medicine

## 2011-08-03 ENCOUNTER — Other Ambulatory Visit: Payer: Self-pay | Admitting: Nurse Practitioner

## 2011-08-03 DIAGNOSIS — Z1231 Encounter for screening mammogram for malignant neoplasm of breast: Secondary | ICD-10-CM

## 2011-08-24 ENCOUNTER — Ambulatory Visit (INDEPENDENT_AMBULATORY_CARE_PROVIDER_SITE_OTHER): Payer: BC Managed Care – HMO | Admitting: Family Medicine

## 2011-08-24 ENCOUNTER — Ambulatory Visit (HOSPITAL_COMMUNITY)
Admission: RE | Admit: 2011-08-24 | Discharge: 2011-08-24 | Disposition: A | Discharge status: Home / Self Care | Payer: BC Managed Care – HMO | Source: Ambulatory Visit | Attending: Family Medicine | Admitting: Family Medicine

## 2011-08-24 ENCOUNTER — Encounter: Payer: Self-pay | Admitting: Family Medicine

## 2011-08-24 VITALS — BP 121/76 | HR 87 | Temp 97.8°F | Ht 67.25 in | Wt 260.4 lb

## 2011-08-24 DIAGNOSIS — R937 Abnormal findings on diagnostic imaging of other parts of musculoskeletal system: Secondary | ICD-10-CM | POA: Insufficient documentation

## 2011-08-24 DIAGNOSIS — S83411A Sprain of medial collateral ligament of right knee, initial encounter: Secondary | ICD-10-CM | POA: Insufficient documentation

## 2011-08-24 DIAGNOSIS — S83419A Sprain of medial collateral ligament of unspecified knee, initial encounter: Secondary | ICD-10-CM

## 2011-08-24 DIAGNOSIS — M25569 Pain in unspecified knee: Secondary | ICD-10-CM | POA: Insufficient documentation

## 2011-08-24 DIAGNOSIS — X500XXA Overexertion from strenuous movement or load, initial encounter: Secondary | ICD-10-CM

## 2011-08-24 MED ORDER — KETOPROFEN 75 MG PO CAPS: 75.0000 mg | ORAL_CAPSULE | Freq: Four times a day (QID) | ORAL | Status: AC | PRN

## 2011-08-24 NOTE — Progress Notes (Signed)
  Subjective:   Patient ID: Christina Sparks, female DOB: 12-24-1970 41 y.o. MRN: [id number] HPI:  1. Right knee pain Synopsis: patient has chronic OA of the right knee. On Memorial day she was wearing sandles and slipped with a twisting injury of the right knee. She can barely bear weight. It is worsening. She has been taking ibuprofen with some relief. She feels a popping.  Location: right medial knee.  Onset: has been acute on chronic  Time period of: 4 day(s).  Severity is described as severe.  Aggravating: weight bearing Alleviating: none Associated sx/sn: no fever, no other joint injury, no head injury.   History  Substance Use Topics  . Smoking status: Never Smoker   . Smokeless tobacco: Not on file  . Alcohol Use: 1.2 oz/week    2 Glasses of wine per week     occasional    Review of Systems: Pertinent items are noted in HPI.  Labs Reviewed: yes Reviewed Chart Review for last notes.     Objective:   Filed Vitals:   08/24/11 0923  BP: 121/76  Pulse: 87  Temp: 97.8 F (36.6 C)  TempSrc: Oral  Height: 5' 7.25" (1.708 m)  Weight: 260 lb 6.4 oz (118.117 kg)   Physical Exam: General: aaf, nad, pleasant Right knee: TTP of medial area. Neg. Apprehension, neg. Drawer signs. FROM. Cannot bear weight without pain. Minimal swelling.  Calf non-tender.  Assessment & Plan:

## 2011-08-24 NOTE — Patient Instructions (Signed)
It was great to see you today!  Schedule an appointment to see Christina Sparks in 1 week.  I have ordered an MRI of your right knee.  Meds ordered this encounter  Medications  . ketoprofen (ORUDIS) 75 MG capsule    Sig: Take 1 capsule (75 mg total) by mouth 4 (four) times daily as needed for pain.    Dispense:  30 capsule    Refill:  0

## 2011-08-24 NOTE — Assessment & Plan Note (Signed)
Acute on chronic twisting injury. Poor weight bearing. Likely sprain of MCL, but could be a tear. Will obtain MRI. Ketoprofen for pain. Rest, ICE elevation. 20 minutes of ice TID. One week follow up.

## 2011-08-27 ENCOUNTER — Encounter: Payer: Self-pay | Admitting: Family Medicine

## 2011-08-27 ENCOUNTER — Telehealth: Payer: Self-pay | Admitting: Family Medicine

## 2011-08-27 NOTE — Telephone Encounter (Signed)
Spoke with pt and she informed me that the note will need state the following dates 6/3 thru 08/30/2011. I told her that Dr. Rolene Arbour note stated that 6/3-08/28/2011. I told her that I would have to speak with him concerning the revision of this and as soon as it is done I will fax it to Malon Kindle [phone] and will call her back informing her that it has been done. Pt agreed and understood.Heath Gold  I also mailed her a copy of the note.Loralee Pacas [place]

## 2011-08-27 NOTE — Telephone Encounter (Signed)
Discussed mri results with patient.  She has bursitis of the knee. I will give her a two day off note for work. Advised to continue to rest it, ice it, and use NSAIDS.

## 2011-08-31 ENCOUNTER — Ambulatory Visit (HOSPITAL_COMMUNITY)
Admission: RE | Admit: 2011-08-31 | Discharge: 2011-08-31 | Disposition: A | Discharge status: Home / Self Care | Payer: BC Managed Care – HMO | Source: Ambulatory Visit | Attending: Nurse Practitioner | Admitting: Nurse Practitioner

## 2011-08-31 ENCOUNTER — Encounter: Payer: Self-pay | Admitting: Family Medicine

## 2011-08-31 ENCOUNTER — Ambulatory Visit (INDEPENDENT_AMBULATORY_CARE_PROVIDER_SITE_OTHER): Payer: BC Managed Care – HMO | Admitting: Family Medicine

## 2011-08-31 VITALS — BP 120/84 | HR 88 | Ht 67.25 in | Wt 261.0 lb

## 2011-08-31 DIAGNOSIS — D649 Anemia, unspecified: Secondary | ICD-10-CM

## 2011-08-31 DIAGNOSIS — IMO0002 Reserved for concepts with insufficient information to code with codable children: Secondary | ICD-10-CM

## 2011-08-31 DIAGNOSIS — E669 Obesity, unspecified: Secondary | ICD-10-CM

## 2011-08-31 DIAGNOSIS — Z1231 Encounter for screening mammogram for malignant neoplasm of breast: Secondary | ICD-10-CM

## 2011-08-31 DIAGNOSIS — M1710 Unilateral primary osteoarthritis, unspecified knee: Secondary | ICD-10-CM

## 2011-08-31 DIAGNOSIS — M1711 Unilateral primary osteoarthritis, right knee: Secondary | ICD-10-CM

## 2011-08-31 DIAGNOSIS — M25569 Pain in unspecified knee: Secondary | ICD-10-CM

## 2011-08-31 LAB — COMPREHENSIVE METABOLIC PANEL
ALT: 14 U/L (ref 0–35)
AST: 17 U/L (ref 0–37)
Albumin: 4.1 g/dL (ref 3.5–5.2)
Alkaline Phosphatase: 66 U/L (ref 39–117)
BUN: 16 mg/dL (ref 6–23)
CO2: 25 mEq/L (ref 19–32)
Calcium: 8.3 mg/dL — ABNORMAL LOW (ref 8.4–10.5)
Chloride: 105 mEq/L (ref 96–112)
Creat: 0.66 mg/dL (ref 0.50–1.10)
Glucose, Bld: 90 mg/dL (ref 70–99)
Potassium: 4 mEq/L (ref 3.5–5.3)
Sodium: 137 mEq/L (ref 135–145)
Total Bilirubin: 0.2 mg/dL — ABNORMAL LOW (ref 0.3–1.2)
Total Protein: 6.8 g/dL (ref 6.0–8.3)

## 2011-08-31 LAB — CBC
HCT: 32.8 % — ABNORMAL LOW (ref 36.0–46.0)
Hemoglobin: 10.2 g/dL — ABNORMAL LOW (ref 12.0–15.0)
MCH: 21.5 pg — ABNORMAL LOW (ref 26.0–34.0)
MCHC: 31.1 g/dL (ref 30.0–36.0)
MCV: 69.1 fL — ABNORMAL LOW (ref 78.0–100.0)
Platelets: 372 10*3/uL (ref 150–400)
RBC: 4.75 MIL/uL (ref 3.87–5.11)
RDW: 17.6 % — ABNORMAL HIGH (ref 11.5–15.5)
WBC: 9.5 10*3/uL (ref 4.0–10.5)

## 2011-08-31 LAB — ANEMIA PANEL
%SAT: 6 % — ABNORMAL LOW (ref 20–55)
ABS Retic: 57 10*3/uL (ref 19.0–186.0)
Ferritin: 10 ng/mL (ref 10–291)
Folate: 14.6 ng/mL
Iron: 24 ug/dL — ABNORMAL LOW (ref 42–145)
RBC.: 4.75 MIL/uL (ref 3.87–5.11)
Retic Ct Pct: 1.2 % (ref 0.4–2.3)
TIBC: 404 ug/dL (ref 250–470)
UIBC: 380 ug/dL (ref 125–400)
Vitamin B-12: 605 pg/mL (ref 211–911)

## 2011-08-31 LAB — POCT GLYCOSYLATED HEMOGLOBIN (HGB A1C): Hemoglobin A1C: 6

## 2011-08-31 NOTE — Assessment & Plan Note (Addendum)
BMI of 40.58. 17 lb wt loss since seeing me last. Pt exercise limited due to knee pain and worried she will not be able to keep losing wt. Continues to improve overall diet. Spent >5 min discussing diet and exercise options. Will see if insurance will cover meeting w/ clinical nutritionist in house

## 2011-08-31 NOTE — Patient Instructions (Signed)
Thank you for coming in today  The steroid injection should help relieve your pain for up to 3 months. Please continue exercising and making dietary changes as this will help with your overall knee pain and joint health Please come back to the lab at your convenience to have more blood work drawn.  Please follow up with me in approximately 1 month to review your blood work and assess your knee pain.  Have a great day.   Knee Exercises EXERCISES RANGE OF MOTION(ROM) AND STRETCHING EXERCISES These exercises may help you when beginning to rehabilitate your injury. Your symptoms may resolve with or without further involvement from your physician, physical therapist or athletic trainer. While completing these exercises, remember:   Restoring tissue flexibility helps normal motion to return to the joints. This allows healthier, less painful movement and activity.   An effective stretch should be held for at least 30 seconds.   A stretch should never be painful. You should only feel a gentle lengthening or release in the stretched tissue.  STRETCH - Knee Extension, Prone  Lie on your stomach on a firm surface, such as a bed or countertop. Place your right / left knee and leg just beyond the edge of the surface. You may wish to place a towel under the far end of your right / left thigh for comfort.   Relax your leg muscles and allow gravity to straighten your knee. Your clinician may advise you to add an ankle weight if more resistance is helpful for you.   You should feel a stretch in the back of your right / left knee. Hold this position for __________ seconds.  Repeat __________ times. Complete this stretch __________ times per day. * Your physician, physical therapist or athletic trainer may ask you to add ankle weight to enhance your stretch.  RANGE OF MOTION - Knee Flexion, Active  Lie on your back with both knees straight. (If this causes back discomfort, bend your opposite knee, placing  your foot flat on the floor.)   Slowly slide your heel back toward your buttocks until you feel a gentle stretch in the front of your knee or thigh.   Hold for __________ seconds. Slowly slide your heel back to the starting position.  Repeat __________ times. Complete this exercise __________ times per day.  STRETCH - Quadriceps, Prone   Lie on your stomach on a firm surface, such as a bed or padded floor.   Bend your right / left knee and grasp your ankle. If you are unable to reach, your ankle or pant leg, use a belt around your foot to lengthen your reach.   Gently pull your heel toward your buttocks. Your knee should not slide out to the side. You should feel a stretch in the front of your thigh and/or knee.   Hold this position for __________ seconds.  Repeat __________ times. Complete this stretch __________ times per day.  STRETCH - Hamstrings, Supine   Lie on your back. Loop a belt or towel over the ball of your right / left foot.   Straighten your right / left knee and slowly pull on the belt to raise your leg. Do not allow the right / left knee to bend. Keep your opposite leg flat on the floor.   Raise the leg until you feel a gentle stretch behind your right / left knee or thigh. Hold this position for __________ seconds.  Repeat __________ times. Complete this stretch __________ times per  day.  STRENGTHENING EXERCISES These exercises may help you when beginning to rehabilitate your injury. They may resolve your symptoms with or without further involvement from your physician, physical therapist or athletic trainer. While completing these exercises, remember:   Muscles can gain both the endurance and the strength needed for everyday activities through controlled exercises.   Complete these exercises as instructed by your physician, physical therapist or athletic trainer. Progress the resistance and repetitions only as guided.   You may experience muscle soreness or fatigue,  but the pain or discomfort you are trying to eliminate should never worsen during these exercises. If this pain does worsen, stop and make certain you are following the directions exactly. If the pain is still present after adjustments, discontinue the exercise until you can discuss the trouble with your clinician.  STRENGTH - Quadriceps, Isometrics  Lie on your back with your right / left leg extended and your opposite knee bent.   Gradually tense the muscles in the front of your right / left thigh. You should see either your knee cap slide up toward your hip or increased dimpling just above the knee. This motion will push the back of the knee down toward the floor/mat/bed on which you are lying.   Hold the muscle as tight as you can without increasing your pain for __________ seconds.   Relax the muscles slowly and completely in between each repetition.  Repeat __________ times. Complete this exercise __________ times per day.  STRENGTH - Quadriceps, Short Arcs   Lie on your back. Place a __________ inch towel roll under your knee so that the knee slightly bends.   Raise only your lower leg by tightening the muscles in the front of your thigh. Do not allow your thigh to rise.   Hold this position for __________ seconds.  Repeat __________ times. Complete this exercise __________ times per day.  OPTIONAL ANKLE WEIGHTS: Begin with ____________________, but DO NOT exceed ____________________. Increase in 1 pound/0.5 kilogram increments.  STRENGTH - Quadriceps, Straight Leg Raises  Quality counts! Watch for signs that the quadriceps muscle is working to insure you are strengthening the correct muscles and not "cheating" by substituting with healthier muscles.  Lay on your back with your right / left leg extended and your opposite knee bent.   Tense the muscles in the front of your right / left thigh. You should see either your knee cap slide up or increased dimpling just above the knee. Your  thigh may even quiver.   Tighten these muscles even more and raise your leg 4 to 6 inches off the floor. Hold for __________ seconds.   Keeping these muscles tense, lower your leg.   Relax the muscles slowly and completely in between each repetition.  Repeat __________ times. Complete this exercise __________ times per day.  STRENGTH - Hamstring, Curls  Lay on your stomach with your legs extended. (If you lay on a bed, your feet may hang over the edge.)   Tighten the muscles in the back of your thigh to bend your right / left knee up to 90 degrees. Keep your hips flat on the bed/floor.   Hold this position for __________ seconds.   Slowly lower your leg back to the starting position.  Repeat __________ times. Complete this exercise __________ times per day.  OPTIONAL ANKLE WEIGHTS: Begin with ____________________, but DO NOT exceed ____________________. Increase in 1 pound/0.5 kilogram increments.  STRENGTH - Quadriceps, Squats  Stand in a door frame so  that your feet and knees are in line with the frame.   Use your hands for balance, not support, on the frame.   Slowly lower your weight, bending at the hips and knees. Keep your lower legs upright so that they are parallel with the door frame. Squat only within the range that does not increase your knee pain. Never let your hips drop below your knees.   Slowly return upright, pushing with your legs, not pulling with your hands.  Repeat __________ times. Complete this exercise __________ times per day.  STRENGTH - Quadriceps, Wall Slides  Follow guidelines for form closely. Increased knee pain often results from poorly placed feet or knees.  Lean against a smooth wall or door and walk your feet out 18-24 inches. Place your feet hip-width apart.   Slowly slide down the wall or door until your knees bend __________ degrees.* Keep your knees over your heels, not your toes, and in line with your hips, not falling to either side.   Hold  for __________ seconds. Stand up to rest for __________ seconds in between each repetition.  Repeat __________ times. Complete this exercise __________ times per day. * Your physician, physical therapist or athletic trainer will alter this angle based on your symptoms and progress. Document Released: 01/24/2005 Document Revised: 03/01/2011 Document Reviewed: 06/24/2008 [facility] Patient Information 2012 [place], [place].

## 2011-08-31 NOTE — Assessment & Plan Note (Addendum)
Osteoarthritis. Pain will likley improve most w/ wt loss and development of the vastus medialis and other quad muscles. Knee injection today (kenelog/Lidocaine 1:3 ratio). Pt to continue w/ home exercise and wt loss, trying to minimize activities that significantly increase stress on knee, such as running. WIll try activities such as eliptical, cycling, aerobic exercises that are done while standing in place. Diet control. Pt aware of risks benefits of injection and of limitations of injections.

## 2011-09-01 NOTE — Progress Notes (Signed)
  Subjective:    Patient ID: Christina Sparks, female    DOB: 11-Oct-1970, 41 y.o.   MRN: [id number]  HPI  CC: Knee pain  Knee Pain: Pain has continued to worsen over the past several weeks. Initially worsened about 3-4 mo ago, w/o h/o trauma. Recently seen in clinic by fellow physician who recommended MRI. Reviewed results w/ pt. Significant for possible pes anserine bursitis.  Pt reportss 17lb wt loss since last knee injection back in March, as pt able to exercise w/o knee pain. Pain is ache and sometimes sharp. Pt reports medial fullness and increased pain after 1 particular day of exercise and increased ambulation. Aggrevated by ambulation and alleviated w/ rest and NSAIDs. Denies fever, rash, other joint pain.  Obesity: 17lb wt loss as mentioned above since march. Pt still w/ BMI of >40. Pt unable to lose wt ever since knee pain has become worse, as this limits her ability to exercise. Has changed dietary habits and trying to change lifestyle. Pt still participating in outside dietary program. Pt fairly new to practice and has not been checked for diabetes or cholesterol. Strong family history of DM (maternal aunt), heart attacks, HTN, and high cholesterol. Denies CP, HA, SOB, syncope, palpitations.  Anemia: Pt reporting daily orthostatic complaints when going from lying or sitting to standing. Denies, hematuria, hematemesis, hematochezia, syncope. No FMHx of malignancy.  No heavy menstrual bleeding bleeding. Has been taking daily iron supplement since last appt. This is new for her over the past year.    Review of Systems Per HPI    Objective:   Physical Exam  Gen: Obese, NAD CV: RRR Musc: No joint effusion, no tenderness over the pes anserine bursa, tenderness over the medial portion of the knee joint space w/ leg and hip flexed,  Ext: 2+ distal pulses.       Assessment & Plan:

## 2011-09-03 NOTE — Assessment & Plan Note (Signed)
Stool hemocult cards, anemia panel, cmet, cbc. Concern for GI loss, or hematologic cause of symptomatic anemia.

## 2011-09-07 ENCOUNTER — Other Ambulatory Visit: Payer: BC Managed Care – HMO

## 2011-09-07 DIAGNOSIS — E669 Obesity, unspecified: Secondary | ICD-10-CM

## 2011-09-07 LAB — LIPID PANEL
Cholesterol: 169 mg/dL (ref 0–200)
HDL: 54 mg/dL (ref >39)
LDL Cholesterol: 104 mg/dL — ABNORMAL HIGH (ref 0–99)
Total CHOL/HDL Ratio: 3.1 Ratio
Triglycerides: 55 mg/dL (ref <150)
VLDL: 11 mg/dL (ref 0–40)

## 2011-09-07 NOTE — Progress Notes (Signed)
FLP DONE TODAY Christina Sparks 

## 2011-09-16 NOTE — Progress Notes (Signed)
Patient ID: Christina Sparks, female   DOB: 1970/10/03, 41 y.o.   MRN: [id number]    INJECTION: Patient was given informed consent, signed copy in the chart. Appropriate time out was taken. Area prepped and draped in usual sterile fashion. 1 cc of methylprednisolone 40 mg/ml plus  3 cc of 1% lidocaine without epinephrine was injected into the joint space using a(n) medial approach. The patient tolerated the procedure well. There were no complications. Post procedure instructions were given.  Shelly Flatten, MD Family Medicine PGY-1

## 2011-09-18 ENCOUNTER — Telehealth: Payer: Self-pay | Admitting: Family Medicine

## 2011-09-18 NOTE — Telephone Encounter (Signed)
Called to inform pt of results, but pt unavailable. Left a message for pt to call office.   Lipid panel noted w/ slightly elevated LDL., but due to low risk factors, no intervention at this time

## 2011-09-18 NOTE — Telephone Encounter (Signed)
Message copied by Ozella Rocks on Tue Sep 18, 2011  4:47 PM ------      Message from: Barbaraann Barthel      Created: Mon Sep 17, 2011  9:14 AM                   ----- Message -----         From: [facility] Interface         Sent: 09/07/2011   8:15 PM           To: Barbaraann Barthel, MD

## 2011-10-12 ENCOUNTER — Ambulatory Visit: Payer: BC Managed Care – HMO | Admitting: Family Medicine

## 2011-11-08 ENCOUNTER — Ambulatory Visit (INDEPENDENT_AMBULATORY_CARE_PROVIDER_SITE_OTHER): Payer: BC Managed Care – HMO | Admitting: Family Medicine

## 2011-11-08 ENCOUNTER — Encounter: Payer: Self-pay | Admitting: Family Medicine

## 2011-11-08 VITALS — BP 127/92 | HR 74 | Temp 98.7°F | Ht 67.5 in | Wt 265.5 lb

## 2011-11-08 DIAGNOSIS — F419 Anxiety disorder, unspecified: Secondary | ICD-10-CM

## 2011-11-08 DIAGNOSIS — F329 Major depressive disorder, single episode, unspecified: Secondary | ICD-10-CM | POA: Insufficient documentation

## 2011-11-08 DIAGNOSIS — F32A Depression, unspecified: Secondary | ICD-10-CM

## 2011-11-08 DIAGNOSIS — F341 Dysthymic disorder: Secondary | ICD-10-CM

## 2011-11-08 MED ORDER — FLUOXETINE HCL 20 MG PO CAPS: 20.0000 mg | ORAL_CAPSULE | Freq: Every day | ORAL | Status: DC

## 2011-11-08 MED ORDER — CLONAZEPAM 0.5 MG PO TABS: 0.5000 mg | ORAL_TABLET | Freq: Two times a day (BID) | ORAL | Status: DC | PRN

## 2011-11-08 NOTE — Progress Notes (Signed)
  Subjective:    Patient ID: Christina Sparks, female    DOB: 25-Jan-1971, 41 y.o.   MRN: [id number]  HPI  Patient presents to same-day clinic for nervous breakdown. This occurred today at work. Patient works at [employer].  She says she broke down in tears at work, and her supervisor advised patient to be seen by a physician today.  Patient says she has had difficulty sleeping x1 month. She has been going through emotional distress at home. She and her 45-year-old daughter are moving out of their home, away from her significant other. Patient is on breakdown home life. However, she says that she does have a good support system in [place].  Patient also complains of difficulty sleeping, loss of interest or pleasure in activities, decreased concentration, low energy. Patient denies any suicidal or homicidal thoughts.  Of note, patient had a similar episode 4-5 years ago. She also nervous breakdown and went to the urgent care Center. She was not started on any antidepressants because she did not have a primary care doctor at that time.    Review of Systems  Per history of present illness    Objective:   Physical Exam  Neck: Normal range of motion. Neck supple. No thyromegaly present.  Cardiovascular: Normal rate, regular rhythm and normal heart sounds.   Pulmonary/Chest: Effort normal and breath sounds normal.  Psychiatric: Her speech is normal and behavior is normal. She exhibits a depressed mood. She expresses no homicidal and no suicidal ideation.          Assessment & Plan:

## 2011-11-08 NOTE — Assessment & Plan Note (Signed)
Patient is interested in starting medication at this time. PHQ survey: 75 Patient denies any suicidal or homicidal thoughts at this time. She has a good support system in town; gave patient handout for family services of the [place], crisis line. Will start Prozac 20 mg daily and clonazepam 0.5 mg twice daily as needed for anxiety x2 weeks only. Discussed with patient that benzodiazepine is a short-term medication and will not be refilled by PCP after 2 weeks. Patient agreed and understood plan. Red flags reviewed per after visit summary. Follow-up with PCP next week.

## 2011-11-08 NOTE — Patient Instructions (Addendum)
It was nice to meet you today, Taleeyah. Please pick up two medications - Prozac is to be taken daily and Klonopin to be taken twice daily as needed for anxiety. Schedule follow up appointment with your PCP in ONE week or sooner as needed.  Depression You have signs of depression. This is a common problem. It can occur at any age. It is often hard to recognize. People can suffer from depression and still have moments of enjoyment. Depression interferes with your basic ability to function in life. It upsets your relationships, sleep, eating, and work habits. CAUSES  Depression is believed to be caused by an imbalance in brain chemicals. It may be triggered by an unpleasant event. Relationship crises, a death in the family, financial worries, retirement, or other stressors are normal causes of depression. Depression may also start for no known reason. Other factors that may play a part include medical illnesses, some medicines, genetics, and alcohol or drug abuse. SYMPTOMS   Feeling unhappy or worthless.   Long-lasting (chronic) tiredness or worn-out feeling.   Self-destructive thoughts and actions.   Not being able to sleep or sleeping too much.   Eating more than usual or not eating at all.   Headaches or feeling anxious.   Trouble concentrating or making decisions.   Unexplained physical problems and substance abuse.  TREATMENT  Depression usually gets better with treatment. This can include:  Antidepressant medicines. It can take weeks before the proper dose is achieved and benefits are reached.   Talking with a therapist, clergyperson, counselor, or friend. These people can help you gain insight into your problem and regain control of your life.   Eating a good diet.   Getting regular physical exercise, such as walking for 30 minutes every day.   Not abusing alcohol or drugs.  Treating depression often takes 6 months or longer. This length of treatment is needed to keep symptoms  from returning. Call your caregiver and arrange for follow-up care as suggested. SEEK IMMEDIATE MEDICAL CARE IF:   You start to have thoughts of hurting yourself or others.   Call your local emergency services (911 in [place]   Go to your local medical emergency department.   Call the National Suicide Prevention Lifeline: 1-800-273-TALK 615-203-7531).  Document Released: 03/12/2005 Document Revised: 03/01/2011 Document Reviewed: 08/12/2009 [facility] Patient Information 2012 [place], [place].

## 2011-12-04 ENCOUNTER — Encounter: Payer: Self-pay | Admitting: Family Medicine

## 2011-12-04 ENCOUNTER — Ambulatory Visit (INDEPENDENT_AMBULATORY_CARE_PROVIDER_SITE_OTHER): Payer: BC Managed Care – HMO | Admitting: Family Medicine

## 2011-12-04 VITALS — BP 110/74 | HR 80 | Temp 98.4°F | Ht 67.0 in | Wt 265.0 lb

## 2011-12-04 DIAGNOSIS — R5381 Other malaise: Secondary | ICD-10-CM

## 2011-12-04 DIAGNOSIS — D649 Anemia, unspecified: Secondary | ICD-10-CM

## 2011-12-04 DIAGNOSIS — F32A Depression, unspecified: Secondary | ICD-10-CM

## 2011-12-04 DIAGNOSIS — F341 Dysthymic disorder: Secondary | ICD-10-CM

## 2011-12-04 DIAGNOSIS — F329 Major depressive disorder, single episode, unspecified: Secondary | ICD-10-CM

## 2011-12-04 DIAGNOSIS — R5383 Other fatigue: Secondary | ICD-10-CM

## 2011-12-04 DIAGNOSIS — M62838 Other muscle spasm: Secondary | ICD-10-CM | POA: Insufficient documentation

## 2011-12-04 DIAGNOSIS — F419 Anxiety disorder, unspecified: Secondary | ICD-10-CM

## 2011-12-04 LAB — CBC
HCT: 34.4 % — ABNORMAL LOW (ref 36.0–46.0)
Hemoglobin: 10.7 g/dL — ABNORMAL LOW (ref 12.0–15.0)
MCH: 21.7 pg — ABNORMAL LOW (ref 26.0–34.0)
MCHC: 31.1 g/dL (ref 30.0–36.0)
MCV: 69.6 fL — ABNORMAL LOW (ref 78.0–100.0)
Platelets: 365 10*3/uL (ref 150–400)
RBC: 4.94 MIL/uL (ref 3.87–5.11)
RDW: 16.8 % — ABNORMAL HIGH (ref 11.5–15.5)
WBC: 8.8 10*3/uL (ref 4.0–10.5)

## 2011-12-04 MED ORDER — FERROUS SULFATE 325 (65 FE) MG PO TABS: 325.0000 mg | ORAL_TABLET | Freq: Every day | ORAL | Status: DC

## 2011-12-04 MED ORDER — FLUOXETINE HCL 40 MG PO CAPS: 40.0000 mg | ORAL_CAPSULE | Freq: Every day | ORAL | Status: DC

## 2011-12-04 NOTE — Patient Instructions (Addendum)
Thank you for coming in to see me today You are doing very well considering your current life challenges.  Keep fighting the fight. As with everything this difficult time will pass and you will be a stronger person because of it Please start taking Fluoxetine 40mg  daily Please get your blood drawn today Please come back to see me in 2 weeks Please call any time day or night to speak to a physician should the need arise.  Please start taking a daily vitamin Please start taking the iron pills that I will send to the pharmacy

## 2011-12-05 LAB — BASIC METABOLIC PANEL
BUN: 7 mg/dL (ref 6–23)
CO2: 26 mEq/L (ref 19–32)
Calcium: 9 mg/dL (ref 8.4–10.5)
Chloride: 103 mEq/L (ref 96–112)
Creat: 0.64 mg/dL (ref 0.50–1.10)
Glucose, Bld: 86 mg/dL (ref 70–99)
Potassium: 4.1 mEq/L (ref 3.5–5.3)
Sodium: 138 mEq/L (ref 135–145)

## 2011-12-05 LAB — TSH: TSH: 1.024 u[IU]/mL (ref 0.350–4.500)

## 2011-12-07 ENCOUNTER — Ambulatory Visit: Payer: BC Managed Care – HMO | Admitting: Family Medicine

## 2011-12-10 DIAGNOSIS — R5383 Other fatigue: Secondary | ICD-10-CM | POA: Insufficient documentation

## 2011-12-10 NOTE — Assessment & Plan Note (Signed)
Multifactorial. Depression, anemia, nutrition all contributing. Pt getting good sleep. Pt to restart multivitamin, iron supplement. TSH today and cmet, and cbc

## 2011-12-10 NOTE — Assessment & Plan Note (Signed)
Depression unchanged. Increase Prozac to 40mg . Spent >20 minutes discussing home support system, steps to overcome challenges, things that she can do to bring joy into her life. Pt reasured of accessability of provider. Pt to come back in 2 wks

## 2011-12-10 NOTE — Progress Notes (Signed)
  Subjective:    Patient ID: Christina Sparks, female    DOB: 1970-07-21, 41 y.o.   MRN: [id number]  HPI CC: Depression, fatigue, muscle cramps  Depression: Seen at last appointmtnet, 2wk ago for this. Started on prozac 20mg  qday w/ minimal benefit. No h/o dpression. Significant recent stressors came on about 2 mo ago. Stressors include Ex fiance moving out, house went into foreclosure, changes at work have significantly increased her work load. Reports not enjoying her normal hobbies. Denies suicidial or homicidal ideation. THoughts of hurting self have occurred to her but she does not pay any attention to them, no plan to cary out. Strong support system w/ family who are aware of pts struggles. No FmHx depression.   Fatigue: Previously worked up for [employer] and found to be iron deficient anemic. Has not taken iron supplememnt for 2 mo. Denies any blood loss, vertigo, syncope. Reports a balanced diet, minimal exercise due to knmee pain  Muscle cramps: Off and on for past year. Primarily in R hand. Worsening over 6 mo. Improves w/ massage. Occurs primarily at night. Poor PO cramps are worse. deneis fever, wt loss, palpitations, cp  Pertinent family and past medical history reviewed.   Review of Systems Per hpi    Objective:   Physical Exam   Gen: Somnolent, NAD CV: RRR no m/r/g Musc: Negative for Chvostek's. Muscles in upper back and neck tight w/o point tenderness. Hands, legs w/ normal ROM, strength 5+ in upper and lower extremities and symmetrical.       Assessment & Plan:

## 2011-12-10 NOTE — Assessment & Plan Note (Signed)
electolyte abnormality vs dehydration. CMET today. Pt to start multivitamin and increase water in her diet. If continues to be a problem will start flexeril

## 2011-12-10 NOTE — Assessment & Plan Note (Addendum)
Pt to restart taking iron supplement. No GI complaints previously. CBC and CMET today

## 2011-12-12 ENCOUNTER — Telehealth: Payer: Self-pay | Admitting: Family Medicine

## 2011-12-12 NOTE — Telephone Encounter (Signed)
Called and left a voicemail for pt

## 2011-12-20 ENCOUNTER — Ambulatory Visit: Payer: BC Managed Care – HMO | Admitting: Family Medicine

## 2011-12-21 ENCOUNTER — Ambulatory Visit: Payer: BC Managed Care – HMO | Admitting: Family Medicine

## 2012-01-21 ENCOUNTER — Encounter: Payer: Self-pay | Admitting: Family Medicine

## 2012-01-21 ENCOUNTER — Ambulatory Visit (INDEPENDENT_AMBULATORY_CARE_PROVIDER_SITE_OTHER): Payer: BC Managed Care – HMO | Admitting: Family Medicine

## 2012-01-21 VITALS — BP 118/78 | HR 86 | Temp 97.8°F | Ht 67.0 in | Wt 269.5 lb

## 2012-01-21 DIAGNOSIS — M1711 Unilateral primary osteoarthritis, right knee: Secondary | ICD-10-CM

## 2012-01-21 DIAGNOSIS — M1710 Unilateral primary osteoarthritis, unspecified knee: Secondary | ICD-10-CM

## 2012-01-21 DIAGNOSIS — IMO0002 Reserved for concepts with insufficient information to code with codable children: Secondary | ICD-10-CM

## 2012-01-21 MED ORDER — IBUPROFEN 600 MG PO TABS: 600.0000 mg | ORAL_TABLET | Freq: Three times a day (TID) | ORAL | Status: DC | PRN

## 2012-01-21 NOTE — Patient Instructions (Addendum)
You have a flare of arthritis and maybe bursitis. You should keep ice and elevate the knee. You may try tylenol or motrin for pain. Make appointment with Dr. Konrad Dolores in 2 weeks for follow up. You may be able to get an injection in the bursa later.  Knee Pain The knee is the complex joint between your thigh and your lower leg. It is made up of bones, tendons, ligaments, and cartilage. The bones that make up the knee are:  The femur in the thigh.  The tibia and fibula in the lower leg.  The patella or kneecap riding in the groove on the lower femur. CAUSES  Knee pain is a common complaint with many causes. A few of these causes are:  Injury, such as:  A ruptured ligament or tendon injury.  Torn cartilage.  Medical conditions, such as:  Gout  Arthritis  Infections  Overuse, over training or overdoing a physical activity. Knee pain can be minor or severe. Knee pain can accompany debilitating injury. Minor knee problems often respond well to self-care measures or get well on their own. More serious injuries may need medical intervention or even surgery. SYMPTOMS The knee is complex. Symptoms of knee problems can vary widely. Some of the problems are:  Pain with movement and weight bearing.  Swelling and tenderness.  Buckling of the knee.  Inability to straighten or extend your knee.  Your knee locks and you cannot straighten it.  Warmth and redness with pain and fever.  Deformity or dislocation of the kneecap. DIAGNOSIS  Determining what is wrong may be very straight forward such as when there is an injury. It can also be challenging because of the complexity of the knee. Tests to make a diagnosis may include:  Your caregiver taking a history and doing a physical exam.  Routine X-rays can be used to rule out other problems. X-rays will not reveal a cartilage tear. Some injuries of the knee can be diagnosed by:  Arthroscopy a surgical technique by which a small video  camera is inserted through tiny incisions on the sides of the knee. This procedure is used to examine and repair internal knee joint problems. Tiny instruments can be used during arthroscopy to repair the torn knee cartilage (meniscus).  Arthrography is a radiology technique. A contrast liquid is directly injected into the knee joint. Internal structures of the knee joint then become visible on X-ray film.  An MRI scan is a non x-ray radiology procedure in which magnetic fields and a computer produce two- or three-dimensional images of the inside of the knee. Cartilage tears are often visible using an MRI scanner. MRI scans have largely replaced arthrography in diagnosing cartilage tears of the knee.  Blood work.  Examination of the fluid that helps to lubricate the knee joint (synovial fluid). This is done by taking a sample out using a needle and a syringe. TREATMENT The treatment of knee problems depends on the cause. Some of these treatments are:  Depending on the injury, proper casting, splinting, surgery or physical therapy care will be needed.  Give yourself adequate recovery time. Do not overuse your joints. If you begin to get sore during workout routines, back off. Slow down or do fewer repetitions.  For repetitive activities such as cycling or running, maintain your strength and nutrition.  Alternate muscle groups. For example if you are a weight lifter, work the upper body on one day and the lower body the next.  Either tight or weak  muscles do not give the proper support for your knee. Tight or weak muscles do not absorb the stress placed on the knee joint. Keep the muscles surrounding the knee strong.  Take care of mechanical problems.  If you have flat feet, orthotics or special shoes may help. See your caregiver if you need help.  Arch supports, sometimes with wedges on the inner or outer aspect of the heel, can help. These can shift pressure away from the side of the knee  most bothered by osteoarthritis.  A brace called an "unloader" brace also may be used to help ease the pressure on the most arthritic side of the knee.  If your caregiver has prescribed crutches, braces, wraps or ice, use as directed. The acronym for this is PRICE. This means protection, rest, ice, compression and elevation.  Nonsteroidal anti-inflammatory drugs (NSAID's), can help relieve pain. But if taken immediately after an injury, they may actually increase swelling. Take NSAID's with food in your stomach. Stop them if you develop stomach problems. Do not take these if you have a history of ulcers, stomach pain or bleeding from the bowel. Do not take without your caregiver's approval if you have problems with fluid retention, heart failure, or kidney problems.  For ongoing knee problems, physical therapy may be helpful.  Glucosamine and chondroitin are over-the-counter dietary supplements. Both may help relieve the pain of osteoarthritis in the knee. These medicines are different from the usual anti-inflammatory drugs. Glucosamine may decrease the rate of cartilage destruction.  Injections of a corticosteroid drug into your knee joint may help reduce the symptoms of an arthritis flare-up. They may provide pain relief that lasts a few months. You may have to wait a few months between injections. The injections do have a small increased risk of infection, water retention and elevated blood sugar levels.  Hyaluronic acid injected into damaged joints may ease pain and provide lubrication. These injections may work by reducing inflammation. A series of shots may give relief for as long as 6 months.  Topical painkillers. Applying certain ointments to your skin may help relieve the pain and stiffness of osteoarthritis. Ask your pharmacist for suggestions. Many over the-counter products are approved for temporary relief of arthritis pain.  In some countries, doctors often prescribe topical NSAID's  for relief of chronic conditions such as arthritis and tendinitis. A review of treatment with NSAID creams found that they worked as well as oral medications but without the serious side effects. PREVENTION  Maintain a healthy weight. Extra pounds put more strain on your joints.  Get strong, stay limber. Weak muscles are a common cause of knee injuries. Stretching is important. Include flexibility exercises in your workouts.  Be smart about exercise. If you have osteoarthritis, chronic knee pain or recurring injuries, you may need to change the way you exercise. This does not mean you have to stop being active. If your knees ache after jogging or playing basketball, consider switching to swimming, water aerobics or other low-impact activities, at least for a few days a week. Sometimes limiting high-impact activities will provide relief.  Make sure your shoes fit well. Choose footwear that is right for your sport.  Protect your knees. Use the proper gear for knee-sensitive activities. Use kneepads when playing volleyball or laying carpet. Buckle your seat belt every time you drive. Most shattered kneecaps occur in car accidents.  Rest when you are tired. SEEK MEDICAL CARE IF:  You have knee pain that is continual and does not  seem to be getting better.  SEEK IMMEDIATE MEDICAL CARE IF:  Your knee joint feels hot to the touch and you have a high fever. MAKE SURE YOU:   Understand these instructions.  Will watch your condition.  Will get help right away if you are not doing well or get worse. Document Released: 01/07/2007 Document Revised: 06/04/2011 Document Reviewed: 01/07/2007 [facility] Patient Information 2013 [place], [place].

## 2012-01-21 NOTE — Assessment & Plan Note (Signed)
Knee pain is consistent with previous exacerbations of osteoarthritis. There are no red flags to suggest infectious etiology. I did note finding of possible pes anserine bursitis on MRI and concurrent pain in this area, which may also be contributing. We did an diagnostic/therapeutic right knee injection again today. If pain does not improve, I may suggest a trial of injection to the pes bursa, which may need ultrasound guidance with her habitus. Advised she ice, elevate, and rest knee next 24-48 hrs. May use tylenol or motrin prn for pain. F/u with PCP in 2 weeks.

## 2012-01-21 NOTE — Progress Notes (Signed)
  Subjective:    Patient ID: Christina Sparks, female    DOB: [birth year], 41 y.o.   MRN: [id number]  HPI  1. Right knee pain. This is a recurrent complaint. Patient noted worsening right knee pain after she did some heavy housework this weekend including climbing up and down stairs multiple times. Pain is worse in the medial anterior and posterior knee. There is some mild edema associated. She denies any trauma, falls or specific injuries recently. Previous studies include MRI and plain films show mild arthritic changes and question of has anserine bursitis. Patient has undergone 2 steroid injections to the knee previously with good pain relief. Last one was in June of this year. She has not been using OTC pain medication thus far.  Denies fever, chills, instability, other joint pains, injury, rash, calf pain, myalgia, nausea, emesis.   Review of Systems See HPI otherwise negative.  reports that she has never smoked. She does not have any smokeless tobacco history on file.     Objective:   Physical Exam  Vitals reviewed. Constitutional: She is oriented to person, place, and time. She appears well-developed and well-nourished. No distress.  HENT:  Head: Normocephalic and atraumatic.  Mouth/Throat: Oropharynx is clear and moist.  Eyes: EOM are normal.  Pulmonary/Chest: Effort normal.  Musculoskeletal: She exhibits edema and tenderness.       Exam limited by body habitus, morbid obesity. There is mild effusion noted at right knee mainly superior and medial aspect. Range of motion is good bilaterally, flexion diminished by only -5 on the right compared to left. Extension is symmetric and intact bilaterally. Moderate Tenderness to palpation in the medial joint line and in the area of the has anserine bursa.  No warmth or erythema noted. Ligament laxity is symmetric.   Neurological: She is alert and oriented to person, place, and time.  Skin: No rash noted. She is not diaphoretic. No erythema.   Psychiatric: She has a normal mood and affect.       Assessment & Plan:

## 2012-01-23 ENCOUNTER — Telehealth: Payer: Self-pay | Admitting: Family Medicine

## 2012-01-23 NOTE — Telephone Encounter (Signed)
Called and informed pt that letter is up front for p/u.Loralee Pacas [place]

## 2012-01-23 NOTE — Telephone Encounter (Signed)
Patient is still in quite a bit of pain and will be seeing Dr. Konrad Dolores on Friday and she would like a note to be out until then because she is concerned about being on that leg too much.  She would like a call when the note is ready to be picked up as she will need it today.

## 2012-01-25 ENCOUNTER — Ambulatory Visit (INDEPENDENT_AMBULATORY_CARE_PROVIDER_SITE_OTHER): Payer: BC Managed Care – HMO | Admitting: Family Medicine

## 2012-01-25 ENCOUNTER — Encounter: Payer: Self-pay | Admitting: Family Medicine

## 2012-01-25 VITALS — BP 139/81 | HR 89 | Temp 98.4°F | Ht 67.0 in | Wt 268.0 lb

## 2012-01-25 DIAGNOSIS — F341 Dysthymic disorder: Secondary | ICD-10-CM

## 2012-01-25 DIAGNOSIS — IMO0002 Reserved for concepts with insufficient information to code with codable children: Secondary | ICD-10-CM

## 2012-01-25 DIAGNOSIS — M1711 Unilateral primary osteoarthritis, right knee: Secondary | ICD-10-CM

## 2012-01-25 DIAGNOSIS — R5381 Other malaise: Secondary | ICD-10-CM

## 2012-01-25 DIAGNOSIS — M1710 Unilateral primary osteoarthritis, unspecified knee: Secondary | ICD-10-CM

## 2012-01-25 DIAGNOSIS — F32A Depression, unspecified: Secondary | ICD-10-CM

## 2012-01-25 DIAGNOSIS — D649 Anemia, unspecified: Secondary | ICD-10-CM

## 2012-01-25 DIAGNOSIS — F329 Major depressive disorder, single episode, unspecified: Secondary | ICD-10-CM

## 2012-01-25 DIAGNOSIS — R5383 Other fatigue: Secondary | ICD-10-CM

## 2012-01-25 MED ORDER — MELOXICAM 15 MG PO TABS: 15.0000 mg | ORAL_TABLET | Freq: Every day | ORAL | Status: DC

## 2012-01-25 MED ORDER — FERROUS SULFATE 75 (15 FE) MG/ML PO SOLN
15.0000 mg | Freq: Every day | ORAL | Status: DC
Start: 2012-01-25 — End: 2013-03-10

## 2012-01-25 NOTE — Patient Instructions (Addendum)
Please stop taking the ibuprofen Please start taking the Meloxicam once daily for the next 2 weeks Please stop taking the iron tablets and start taking the iron syrup Please follow up with me in 1 month to recheck your blood counts If your knee pain does not improve within the next 4 weeks let me know and we'll send you to physical therapy.  Knee Exercises EXERCISES RANGE OF MOTION(ROM) AND STRETCHING EXERCISES These exercises may help you when beginning to rehabilitate your injury. Your symptoms may resolve with or without further involvement from your physician, physical therapist or athletic trainer. While completing these exercises, remember:   Restoring tissue flexibility helps normal motion to return to the joints. This allows healthier, less painful movement and activity.  An effective stretch should be held for at least 30 seconds.  A stretch should never be painful. You should only feel a gentle lengthening or release in the stretched tissue. STRETCH - Knee Extension, Prone  Lie on your stomach on a firm surface, such as a bed or countertop. Place your right / left knee and leg just beyond the edge of the surface. You may wish to place a towel under the far end of your right / left thigh for comfort.  Relax your leg muscles and allow gravity to straighten your knee. Your clinician may advise you to add an ankle weight if more resistance is helpful for you.  You should feel a stretch in the back of your right / left knee. Hold this position for __________ seconds. Repeat __________ times. Complete this stretch __________ times per day. * Your physician, physical therapist or athletic trainer may ask you to add ankle weight to enhance your stretch.  RANGE OF MOTION - Knee Flexion, Active  Lie on your back with both knees straight. (If this causes back discomfort, bend your opposite knee, placing your foot flat on the floor.)  Slowly slide your heel back toward your buttocks until  you feel a gentle stretch in the front of your knee or thigh.  Hold for __________ seconds. Slowly slide your heel back to the starting position. Repeat __________ times. Complete this exercise __________ times per day.  STRETCH - Quadriceps, Prone   Lie on your stomach on a firm surface, such as a bed or padded floor.  Bend your right / left knee and grasp your ankle. If you are unable to reach, your ankle or pant leg, use a belt around your foot to lengthen your reach.  Gently pull your heel toward your buttocks. Your knee should not slide out to the side. You should feel a stretch in the front of your thigh and/or knee.  Hold this position for __________ seconds. Repeat __________ times. Complete this stretch __________ times per day.  STRETCH  Hamstrings, Supine   Lie on your back. Loop a belt or towel over the ball of your right / left foot.  Straighten your right / left knee and slowly pull on the belt to raise your leg. Do not allow the right / left knee to bend. Keep your opposite leg flat on the floor.  Raise the leg until you feel a gentle stretch behind your right / left knee or thigh. Hold this position for __________ seconds. Repeat __________ times. Complete this stretch __________ times per day.  STRENGTHENING EXERCISES These exercises may help you when beginning to rehabilitate your injury. They may resolve your symptoms with or without further involvement from your physician, physical therapist or athletic  trainer. While completing these exercises, remember:   Muscles can gain both the endurance and the strength needed for everyday activities through controlled exercises.  Complete these exercises as instructed by your physician, physical therapist or athletic trainer. Progress the resistance and repetitions only as guided.  You may experience muscle soreness or fatigue, but the pain or discomfort you are trying to eliminate should never worsen during these exercises. If  this pain does worsen, stop and make certain you are following the directions exactly. If the pain is still present after adjustments, discontinue the exercise until you can discuss the trouble with your clinician. STRENGTH - Quadriceps, Isometrics  Lie on your back with your right / left leg extended and your opposite knee bent.  Gradually tense the muscles in the front of your right / left thigh. You should see either your knee cap slide up toward your hip or increased dimpling just above the knee. This motion will push the back of the knee down toward the floor/mat/bed on which you are lying.  Hold the muscle as tight as you can without increasing your pain for __________ seconds.  Relax the muscles slowly and completely in between each repetition. Repeat __________ times. Complete this exercise __________ times per day.  STRENGTH - Quadriceps, Short Arcs   Lie on your back. Place a __________ inch towel roll under your knee so that the knee slightly bends.  Raise only your lower leg by tightening the muscles in the front of your thigh. Do not allow your thigh to rise.  Hold this position for __________ seconds. Repeat __________ times. Complete this exercise __________ times per day.  OPTIONAL ANKLE WEIGHTS: Begin with ____________________, but DO NOT exceed ____________________. Increase in 1 pound/0.5 kilogram increments.  STRENGTH - Quadriceps, Straight Leg Raises  Quality counts! Watch for signs that the quadriceps muscle is working to insure you are strengthening the correct muscles and not "cheating" by substituting with healthier muscles.  Lay on your back with your right / left leg extended and your opposite knee bent.  Tense the muscles in the front of your right / left thigh. You should see either your knee cap slide up or increased dimpling just above the knee. Your thigh may even quiver.  Tighten these muscles even more and raise your leg 4 to 6 inches off the floor. Hold  for __________ seconds.  Keeping these muscles tense, lower your leg.  Relax the muscles slowly and completely in between each repetition. Repeat __________ times. Complete this exercise __________ times per day.  STRENGTH - Hamstring, Curls  Lay on your stomach with your legs extended. (If you lay on a bed, your feet may hang over the edge.)  Tighten the muscles in the back of your thigh to bend your right / left knee up to 90 degrees. Keep your hips flat on the bed/floor.  Hold this position for __________ seconds.  Slowly lower your leg back to the starting position. Repeat __________ times. Complete this exercise __________ times per day.  OPTIONAL ANKLE WEIGHTS: Begin with ____________________, but DO NOT exceed ____________________. Increase in 1 pound/0.5 kilogram increments.  STRENGTH  Quadriceps, Squats  Stand in a door frame so that your feet and knees are in line with the frame.  Use your hands for balance, not support, on the frame.  Slowly lower your weight, bending at the hips and knees. Keep your lower legs upright so that they are parallel with the door frame. Squat only within the  range that does not increase your knee pain. Never let your hips drop below your knees.  Slowly return upright, pushing with your legs, not pulling with your hands. Repeat __________ times. Complete this exercise __________ times per day.  STRENGTH - Quadriceps, Wall Slides  Follow guidelines for form closely. Increased knee pain often results from poorly placed feet or knees.  Lean against a smooth wall or door and walk your feet out 18-24 inches. Place your feet hip-width apart.  Slowly slide down the wall or door until your knees bend __________ degrees.* Keep your knees over your heels, not your toes, and in line with your hips, not falling to either side.  Hold for __________ seconds. Stand up to rest for __________ seconds in between each repetition. Repeat __________ times.  Complete this exercise __________ times per day. * Your physician, physical therapist or athletic trainer will alter this angle based on your symptoms and progress. Document Released: 01/24/2005 Document Revised: 06/04/2011 Document Reviewed: 06/24/2008 [facility] Patient Information 2013 [place], [place].

## 2012-01-26 ENCOUNTER — Encounter: Payer: Self-pay | Admitting: Family Medicine

## 2012-01-26 NOTE — Assessment & Plan Note (Addendum)
Not tolerating iron tabs Will DC tabs and give elixer. Pt to take in am w/ orange juice Recheck CBC in 1 mo

## 2012-01-26 NOTE — Assessment & Plan Note (Addendum)
Pt w/ pes anserine bursitis and osteo Pt to DC ibuprofen and start Meloxicam Pt to start home exercises

## 2012-01-26 NOTE — Progress Notes (Signed)
Christina Sparks is a 40 y.o. female who presents to [facility] today for R knee pain   R knee pian: Chronic condition for patient. Comes and goes. Denies trauma to knee. Joint injection 3 days ago w/o much relief. Previous injections worked. Ibuprofen 600mg  w/ minimal relief. Worse w/ ambulation. Improves w/ rest. Denies any falls, loss of sensation in LE, fever, effusion  Depression: Pt developed better coping methods. Life stressors no better. Pt took self off Prozac 2 wks ago. Denies SI/HI  Finger spasm: In hands for years. No t relieved w/ flexeril. Worse w/ typing and after long hair braiding. H/o carpel tunnel in past. No loss of sensation. Relieved w/ message.   HTN: elevated today. No CP, HA, SOB  Iron supplement: Not taking. Pt intolerant to pill form of iron even w/ food. Denies syncope, remains somewhat tired at baseline   The following portions of the patient's history were reviewed and updated as appropriate: allergies, current medications, past medical history, family and social history, and problem list.  Patient is a nonsmoker   Past Medical History  Diagnosis Date  . Back pain   . Anemia   . Migraines   . Knee pain   . S/P tubal ligation   . S/P bilateral breast reduction     ROS as above otherwise neg.    Medications reviewed. Current Outpatient Prescriptions  Medication Sig Dispense Refill  . clonazePAM (KLONOPIN) 0.5 MG tablet Take 1 tablet (0.5 mg total) by mouth 2 (two) times daily as needed for anxiety.  28 tablet  0  . ferrous sulfate (FER-IN-SOL) 75 (15 FE) MG/ML SOLN Take 1 mL (15 mg of iron total) by mouth daily. With breakfast.  1 Bottle  3  . FLUoxetine (PROZAC) 40 MG capsule Take 1 capsule (40 mg total) by mouth daily.  30 capsule  3  . meloxicam (MOBIC) 15 MG tablet Take 1 tablet (15 mg total) by mouth daily.  30 tablet  0  . Multiple Vitamins-Iron (MULTIVITAMIN/IRON) TABS Take 1 tablet by mouth daily.  30 tablet  6  . phentermine 37.5 MG capsule Take 37.5  mg by mouth every morning. Part of weight loss program. Dr. Theodoro Kos is overseeing        Exam: BP 139/81  Pulse 89  Temp 98.4 F (36.9 C) (Oral)  Ht 5\' 7"  (1.702 m)  Wt 268 lb (121.564 kg)  BMI 41.97 kg/m2  LMP 01/01/2012 Gen: Well NAD, Obese Musc: R knee w/ pain over the pes anserin bursa w/ mild pain to palpation at injection site at medial aspect of joint along the meniscus. Hands w/o any malformation and w/ full ROM and w/o pain on palpation   No results found for this or any previous visit (from the past 72 hour(s)).

## 2012-01-26 NOTE — Assessment & Plan Note (Signed)
Continues to be fatigued.  Likely secondary to iron deficiency anemia as well as psychogenic as struggling w/ depression See anemia and depression as above

## 2012-01-26 NOTE — Assessment & Plan Note (Signed)
Pt cautioned against stopping medications w/o discussing w/ doctor and about prolonged taper Pt to monitor for rebound symptoms and to restart medication immediately at 40mg , and to see me in clinic

## 2012-02-08 ENCOUNTER — Ambulatory Visit: Payer: BC Managed Care – HMO | Admitting: Family Medicine

## 2012-02-28 ENCOUNTER — Ambulatory Visit: Payer: BC Managed Care – HMO | Admitting: Family Medicine

## 2013-02-03 ENCOUNTER — Encounter: Payer: Self-pay | Admitting: Internal Medicine

## 2013-02-03 ENCOUNTER — Ambulatory Visit (INDEPENDENT_AMBULATORY_CARE_PROVIDER_SITE_OTHER): Payer: Managed Care, Other (non HMO) | Admitting: Family Medicine

## 2013-02-03 VITALS — BP 109/78 | HR 90 | Ht 67.0 in | Wt 267.3 lb

## 2013-02-03 DIAGNOSIS — M549 Dorsalgia, unspecified: Secondary | ICD-10-CM | POA: Insufficient documentation

## 2013-02-03 DIAGNOSIS — K219 Gastro-esophageal reflux disease without esophagitis: Secondary | ICD-10-CM | POA: Insufficient documentation

## 2013-02-03 DIAGNOSIS — K5900 Constipation, unspecified: Secondary | ICD-10-CM | POA: Insufficient documentation

## 2013-02-03 DIAGNOSIS — R350 Frequency of micturition: Secondary | ICD-10-CM

## 2013-02-03 LAB — POCT URINALYSIS DIPSTICK
Bilirubin, UA: NEGATIVE
Blood, UA: NEGATIVE
Glucose, UA: NEGATIVE
Ketones, UA: NEGATIVE
Leukocytes, UA: NEGATIVE
Nitrite, UA: NEGATIVE
Protein, UA: NEGATIVE
Spec Grav, UA: >=1.03
Urobilinogen, UA: 0.2
pH, UA: 5.5

## 2013-02-03 MED ORDER — PANTOPRAZOLE SODIUM 40 MG PO TBEC: 40.0000 mg | DELAYED_RELEASE_TABLET | Freq: Every day | ORAL | Status: DC

## 2013-02-03 NOTE — Patient Instructions (Signed)
Christina Sparks,   It was nice to meet you. I think the symptoms you're having a result of acid reflux. This is actually made worse over time by coat and other drinks with caffeine. Please read below about foods to avoid which can make reflux worse. Additionally I would like you to start taking a medication called Protonix each day to help reduce acid nearby produces. You need to increase the amount of water the you drink as well, because you're dehydrated and over time this can impact her kidneys. Lastly, is very important thing about regular bowel health and having daily bowel movements. Drinking lots of water will promote this and also increasing her dietary fiber is important. Please read below.   Come back in 2 weeks.   Sincerely,   Dr. Clinton Sawyer  Diet for Gastroesophageal Reflux Disease, Adult Reflux (acid reflux) is when acid from your stomach flows up into the esophagus. When acid comes in contact with the esophagus, the acid causes irritation and soreness (inflammation) in the esophagus. When reflux happens often or so severely that it causes damage to the esophagus, it is called gastroesophageal reflux disease (GERD). Nutrition therapy can help ease the discomfort of GERD. FOODS OR DRINKS TO AVOID OR LIMIT  Smoking or chewing tobacco. Nicotine is one of the most potent stimulants to acid production in the gastrointestinal tract.  Caffeinated and decaffeinated coffee and black tea.  Regular or low-calorie carbonated beverages or energy drinks (caffeine-free carbonated beverages are allowed).   Strong spices, such as black pepper, white pepper, red pepper, cayenne, curry powder, and chili powder.  Peppermint or spearmint.  Chocolate.  High-fat foods, including meats and fried foods. Extra added fats including oils, butter, salad dressings, and nuts. Limit these to less than 8 tsp per day.  Fruits and vegetables if they are not tolerated, such as citrus fruits or  tomatoes.  Alcohol.  Any food that seems to aggravate your condition. If you have questions regarding your diet, call your caregiver or a registered dietitian. OTHER THINGS THAT MAY HELP GERD INCLUDE:   Eating your meals slowly, in a relaxed setting.  Eating 5 to 6 small meals per day instead of 3 large meals.  Eliminating food for a period of time if it causes distress.  Not lying down until 3 hours after eating a meal.  Keeping the head of your bed raised 6 to 9 inches (15 to 23 cm) by using a foam wedge or blocks under the legs of the bed. Lying flat may make symptoms worse.  Being physically active. Weight loss may be helpful in reducing reflux in overweight or obese adults.  Wear loose fitting clothing EXAMPLE MEAL PLAN This meal plan is approximately 2,000 calories based on https://www.[facility].org/ meal planning guidelines. Breakfast   cup cooked oatmeal.  1 cup strawberries.  1 cup low-fat milk.  1 oz almonds. Snack  1 cup cucumber slices.  6 oz yogurt (made from low-fat or fat-free milk). Lunch  2 slice whole-wheat bread.  2 oz sliced [place].  2 tsp mayonnaise.  1 cup blueberries.  1 cup snap peas. Snack  6 whole-wheat crackers.  1 oz string cheese. Dinner   cup brown rice.  1 cup mixed veggies.  1 tsp olive oil.  3 oz grilled fish. Document Released: 03/12/2005 Document Revised: 06/04/2011 Document Reviewed: 01/26/2011 [facility] Patient Information 2014 [place], [place].   GETTING TO GOOD BOWEL HEALTH. Irregular bowel habits such as constipation can lead to many problems over time.  Having one soft bowel movement a day is the most important way to prevent further problems.  The anorectal canal is designed to handle stretching and feces to safely manage our ability to get rid of solid waste (feces, poop, stool) out of our body.  BUT, hard constipated stools can act like ripping concrete bricks causing inflamed hemorrhoids, anal fissures, abdominal  pain and bloating.     The goal: ONE SOFT BOWEL MOVEMENT A DAY!  To have soft, regular bowel movements:    Drink at least 8 tall glasses of water a day.     Take plenty of fiber.  Fiber is the undigested part of plant food that passes into the colon, acting s "natures broom" to encourage bowel motility and movement.  Fiber can absorb and hold large amounts of water. This results in a larger, bulkier stool, which is soft and easier to pass. Work gradually over several weeks up to 6 servings a day of fiber (25g a day even more if needed) in the form of: o Vegetables -- Root (potatoes, carrots, turnips), leafy green (lettuce, salad greens, celery, spinach), or cooked high residue (cabbage, broccoli, etc) o Fruit -- Fresh (unpeeled skin & pulp), Dried (prunes, apricots, cherries, etc ),  or stewed ( applesauce)  o Whole grain breads, pasta, etc (whole wheat)  o Bran cereals    Bulking Agents -- This type of water-retaining fiber generally is easily obtained each day by one of the following:  o Psyllium bran -- The psyllium plant is remarkable because its ground seeds can retain so much water. This product is available as Metamucil, Konsyl, Effersyllium, Per Diem Fiber, or the less expensive generic preparation in drug and health food stores. Although labeled a laxative, it really is not a laxative.  o Methylcellulose -- This is another fiber derived from wood which also retains water. It is available as Citrucel. o Polyethylene Glycol - and "artificial" fiber commonly called Miralax or Glycolax.  It is helpful for people with gassy or bloated feelings with regular fiber o Flax Seed - a less gassy fiber than psyllium   No reading or other relaxing activity while on the toilet. If bowel movements take longer than 5 minutes, you are too constipated   AVOID CONSTIPATION.  High fiber and water intake usually takes care of this.  Sometimes a laxative is needed to stimulate more frequent bowel movements, but     Laxatives are not a good long-term solution as it can wear the colon out. o Osmotics (Milk of Magnesia, Fleets phosphosoda, Magnesium citrate, MiraLax, GoLytely) are safer than  o Stimulants (Senokot, Castor Oil, Dulcolax, Ex Lax)    o Do not take laxatives for more than 7days in a row.    IF SEVERELY CONSTIPATED, try a Bowel Retraining Program: o Do not use laxatives.  o Eat a diet high in roughage, such as bran cereals and leafy vegetables.  o Drink six (6) ounces of prune or apricot juice each morning.  o Eat two (2) large servings of stewed fruit each day.  o Take one (1) heaping tablespoon of a psyllium-based bulking agent twice a day. Use sugar-free sweetener when possible to avoid excessive calories.  o Eat a normal breakfast.  o Set aside 15 minutes after breakfast to sit on the toilet, but do not strain to have a bowel movement.  o If you do not have a bowel movement by the third day, use an enema and repeat the above steps.

## 2013-02-03 NOTE — Progress Notes (Signed)
  Subjective:    Patient ID: Christina Sparks, female    DOB: 1970/04/07, 42 y.o.   MRN: [id number]  HPI  42 year old F who presents with central chest pain, bloating and indigestion while eating. This has been going on for several months. She thinks that she cannot digest her food, because after swallowing her food, she feels like "it gets stuck."  This sensation of food being stuck in her esophagus is relieved by drinking coca cola while she eats. She has to drink coke with each bite of food. She has stopped drinking water for the past week. Subsequenlty she feels lower back pain and frequent urination. She denies dysphagia, odynophagia, fever, chills, nausea or vomiting. She notes that she has less than one bowel movement per day.   Review of Systems See HPI    Objective:   Physical Exam BP 109/78  Pulse 90  Ht 5\' 7"  (1.702 m)  Wt 267 lb 4.8 oz (121.246 kg)  BMI 41.86 kg/m2  LMP 01/10/2013 Gen: well appearing, non distressed, pleasant, obese HEENT: OP clear and moist CV: RRR, no murmurs Pulm: normal WOB, CTA-B Abd: soft, NDNT, NABS Back: mild paraspinal muscle tenderness in lumbar spine, no CVA tenderness  Urinalysis evaluated: negative for leukocytes and nitrites, no protein       Assessment & Plan:

## 2013-02-03 NOTE — Assessment & Plan Note (Signed)
Assessment: initial concern for pyelonephritis given patient's polyuria but negative UA and lack of CVA tenderness makes most likely calls paraspinal muscle spasm Plan: patient will manage symptomatically with NSAIDs

## 2013-02-03 NOTE — Assessment & Plan Note (Signed)
Assessment: new problem likely due to dehydration and low dietary fiber Plan:  - Encouraged increase by mouth water intake - Give information about dietary fiber and stool softeners

## 2013-02-03 NOTE — Assessment & Plan Note (Signed)
Assessment: patient with reflux symptoms; no odynophagia or dysphasia at this time and no weight loss concern for malignant process Plan:  - Start Protonix 40 mg daily - Stop drinking Coke and other caffeinated beverages - Patient information on GERD diet

## 2013-03-10 ENCOUNTER — Ambulatory Visit (INDEPENDENT_AMBULATORY_CARE_PROVIDER_SITE_OTHER): Payer: Managed Care, Other (non HMO) | Admitting: Family Medicine

## 2013-03-10 VITALS — BP 107/77 | HR 68 | Temp 99.1°F | Wt 271.0 lb

## 2013-03-10 DIAGNOSIS — D649 Anemia, unspecified: Secondary | ICD-10-CM

## 2013-03-10 DIAGNOSIS — L02429 Furuncle of limb, unspecified: Secondary | ICD-10-CM | POA: Insufficient documentation

## 2013-03-10 DIAGNOSIS — F341 Dysthymic disorder: Secondary | ICD-10-CM

## 2013-03-10 DIAGNOSIS — L02439 Carbuncle of limb, unspecified: Secondary | ICD-10-CM

## 2013-03-10 DIAGNOSIS — F32A Depression, unspecified: Secondary | ICD-10-CM

## 2013-03-10 DIAGNOSIS — F329 Major depressive disorder, single episode, unspecified: Secondary | ICD-10-CM

## 2013-03-10 MED ORDER — DOXYCYCLINE HYCLATE 100 MG PO TABS: 100.0000 mg | ORAL_TABLET | Freq: Two times a day (BID) | ORAL | Status: DC

## 2013-03-10 MED ORDER — FERROUS SULFATE 75 (15 FE) MG/ML PO SOLN: 15.0000 mg | Freq: Every day | ORAL | Status: DC

## 2013-03-10 MED ORDER — FLUOXETINE HCL 40 MG PO CAPS: 40.0000 mg | ORAL_CAPSULE | Freq: Every day | ORAL | Status: DC

## 2013-03-10 NOTE — Assessment & Plan Note (Signed)
Intermittent infection and recently w/ purulent discharge Doxy Fatigue symptoms likely due to infection (vs anemaia)

## 2013-03-10 NOTE — Assessment & Plan Note (Addendum)
Restart Prozac F/u in 2 wks PHQ9: 20 no SI/HI

## 2013-03-10 NOTE — Assessment & Plan Note (Signed)
Not taking iron Fatigue likely due in part to low iron Restart iron Check CBC in new year

## 2013-03-10 NOTE — Patient Instructions (Signed)
Thank you for coming in today Please restart your prozac and iron supplementation Pelase start the antibiotics Please come back in 2 weeks for blood work and to follow up on your depression and fatigue Have a wonderful day and a merry Christmas

## 2013-03-10 NOTE — Progress Notes (Signed)
Christina Sparks is a 42 y.o. female who presents to [facility] today for Fatigue  Fatigue: came on suddenly last night. Associated w/ chills and nausea. HA recently and worse at and after work as pt has to strain to see. Knows she needs glasses but has not gone to the eye doctor. Denies rash, sinus congestion, sore throat, fevers, vomiting, diarrhea. Still feeling tired today. L armpit w/ knott.  Draining bump under L armpit 2 wks ago. More bumps started under arm 10 days ago. Alcohol soaks w/ some improvement and warm compresses w/ benefit. SHaves under armpit. H/o infections under arm but no hydradinitis. Painful.   Depression/anxiety: worse as of late. Stopped Prozac last year. Felt better when on Prozac - more mello.   GERD: taking protonix daily w/ excellent control.   The following portions of the patient's history were reviewed and updated as appropriate: allergies, current medications, past medical history, family and social history, and problem list.  Patient is a nonsmoker.   Health Maintenance: Refusing flu shot. Pap in the new year.   Past Medical History  Diagnosis Date  . Back pain   . Anemia   . Migraines   . Knee pain   . S/P tubal ligation   . S/P bilateral breast reduction    ROS as above otherwise neg.    Medications reviewed. Current Outpatient Prescriptions  Medication Sig Dispense Refill  . pantoprazole (PROTONIX) 40 MG tablet Take 1 tablet (40 mg total) by mouth daily.  30 tablet  1  . clonazePAM (KLONOPIN) 0.5 MG tablet Take 1 tablet (0.5 mg total) by mouth 2 (two) times daily as needed for anxiety.  28 tablet  0  . doxycycline (VIBRA-TABS) 100 MG tablet Take 1 tablet (100 mg total) by mouth 2 (two) times daily.  20 tablet  0  . ferrous sulfate (FER-IN-SOL) 75 (15 FE) MG/ML SOLN Take 1 mL (15 mg of iron total) by mouth daily. With breakfast.  1 Bottle  3  . FLUoxetine (PROZAC) 40 MG capsule Take 1 capsule (40 mg total) by mouth daily.  30 capsule  3  . phentermine  37.5 MG capsule Take 37.5 mg by mouth every morning. Part of weight loss program. Dr. Theodoro Kos is overseeing       No current facility-administered medications for this visit.    Exam: BP 107/77  Pulse 68  Temp(Src) 99.1 F (37.3 C) (Oral)  Wt 271 lb (122.925 kg)  LMP 02/10/2013 Gen: Well NAD HEENT: EOMI,  MMM SKin: several small palpable areas of induration in L axilla. No purulent dc  No results found for this or any previous visit (from the past 72 hour(s)).  A/P (as seen in Problem list)  Boil, axilla Intermittent infection and recently w/ purulent discharge Doxy Fatigue symptoms likely due to infection (vs anemaia)     Anxiety and depression Restart Prozac F/u in 2 wks   Anemia Not taking iron Fatigue likely due in part to low iron Restart iron Check CBC in new year

## 2013-03-12 ENCOUNTER — Telehealth: Payer: Self-pay | Admitting: Family Medicine

## 2013-03-12 NOTE — Telephone Encounter (Signed)
Pt would dr Konrad Dolores to extend her drs note until tomorrow She is still feeling dizzy. Could not go to work toda Please advise

## 2013-03-12 NOTE — Telephone Encounter (Signed)
OK by me. Print/send letter to have rest of week off

## 2013-03-12 NOTE — Telephone Encounter (Signed)
LM for pt that letter will be ready for pick up tomorrow around 9am. Rhyan Radler,CMA

## 2013-03-13 ENCOUNTER — Encounter: Payer: Self-pay | Admitting: *Deleted

## 2013-03-24 ENCOUNTER — Other Ambulatory Visit (INDEPENDENT_AMBULATORY_CARE_PROVIDER_SITE_OTHER): Payer: Medicaid Other

## 2013-03-24 DIAGNOSIS — D649 Anemia, unspecified: Secondary | ICD-10-CM

## 2013-03-24 LAB — CBC
HCT: 30.4 % — ABNORMAL LOW (ref 36.0–46.0)
Hemoglobin: 9.6 g/dL — ABNORMAL LOW (ref 12.0–15.0)
MCH: 20.5 pg — ABNORMAL LOW (ref 26.0–34.0)
MCHC: 31.6 g/dL (ref 30.0–36.0)
MCV: 65 fL — ABNORMAL LOW (ref 78.0–100.0)
Platelets: 415 10*3/uL — ABNORMAL HIGH (ref 150–400)
RBC: 4.68 MIL/uL (ref 3.87–5.11)
RDW: 16.9 % — ABNORMAL HIGH (ref 11.5–15.5)
WBC: 8.6 10*3/uL (ref 4.0–10.5)

## 2013-03-24 NOTE — Progress Notes (Signed)
CBC DONE TODAY Christina Sparks 

## 2013-03-25 ENCOUNTER — Encounter: Payer: Self-pay | Admitting: *Deleted

## 2013-03-25 NOTE — Progress Notes (Signed)
Marked microcytic anemia Pt needs to take iron supplementation  Shelly Flatten, MD Family Medicine PGY-3 03/25/2013, 1:17 PM

## 2013-03-25 NOTE — Addendum Note (Signed)
Addended by: Konrad Dolores, Elyshia Kumagai J on: 03/25/2013 01:17 PM   Modules accepted: Level of Service

## 2013-04-01 ENCOUNTER — Ambulatory Visit: Payer: Self-pay | Admitting: Family Medicine

## 2013-06-01 ENCOUNTER — Telehealth: Payer: Self-pay | Admitting: Family Medicine

## 2013-06-01 NOTE — Telephone Encounter (Signed)
Pt is wanting to know if she needs to go ahead and get x-rays done today before her appointment on 3/10? She said that if you do to call her at work or cell phone. jw

## 2013-06-01 NOTE — Telephone Encounter (Signed)
Will need to be seen first. Christina Sparks, Christina Sparks

## 2013-06-02 ENCOUNTER — Ambulatory Visit
Admission: RE | Admit: 2013-06-02 | Discharge: 2013-06-02 | Disposition: A | Discharge status: Home / Self Care | Payer: Managed Care, Other (non HMO) | Source: Ambulatory Visit | Attending: Family Medicine | Admitting: Family Medicine

## 2013-06-02 ENCOUNTER — Other Ambulatory Visit: Payer: Self-pay | Admitting: Family Medicine

## 2013-06-02 ENCOUNTER — Ambulatory Visit (INDEPENDENT_AMBULATORY_CARE_PROVIDER_SITE_OTHER): Payer: Managed Care, Other (non HMO) | Admitting: Family Medicine

## 2013-06-02 ENCOUNTER — Encounter: Payer: Self-pay | Admitting: Family Medicine

## 2013-06-02 VITALS — BP 102/69 | HR 97 | Temp 98.2°F | Wt 274.0 lb

## 2013-06-02 DIAGNOSIS — IMO0002 Reserved for concepts with insufficient information to code with codable children: Secondary | ICD-10-CM

## 2013-06-02 DIAGNOSIS — M1711 Unilateral primary osteoarthritis, right knee: Secondary | ICD-10-CM

## 2013-06-02 DIAGNOSIS — M1710 Unilateral primary osteoarthritis, unspecified knee: Secondary | ICD-10-CM

## 2013-06-02 DIAGNOSIS — D649 Anemia, unspecified: Secondary | ICD-10-CM

## 2013-06-02 LAB — CBC
HCT: 29.8 % — ABNORMAL LOW (ref 36.0–46.0)
Hemoglobin: 9 g/dL — ABNORMAL LOW (ref 12.0–15.0)
MCH: 18.8 pg — ABNORMAL LOW (ref 26.0–34.0)
MCHC: 30.2 g/dL (ref 30.0–36.0)
MCV: 62.2 fL — ABNORMAL LOW (ref 78.0–100.0)
Platelets: 466 10*3/uL — ABNORMAL HIGH (ref 150–400)
RBC: 4.79 MIL/uL (ref 3.87–5.11)
RDW: 17.7 % — ABNORMAL HIGH (ref 11.5–15.5)
WBC: 9 10*3/uL (ref 4.0–10.5)

## 2013-06-02 MED ORDER — METHYLPREDNISOLONE ACETATE 80 MG/ML IJ SUSP
80.0000 mg | Freq: Once | INTRAMUSCULAR | Status: AC
  Administered 2013-06-02: 80 mg via INTRA_ARTICULAR

## 2013-06-02 MED ORDER — DICLOFENAC SODIUM 75 MG PO TBEC: 75.0000 mg | DELAYED_RELEASE_TABLET | Freq: Two times a day (BID) | ORAL | Status: DC

## 2013-06-02 NOTE — Patient Instructions (Signed)
Please go to [facility] for your xray Please start the diclofenac as needed for further pain [facility] evaluation of your knee

## 2013-06-02 NOTE — Progress Notes (Signed)
Christina Sparks is a 43 y.o. female who presents to [facility] today for R KNee pain  HPI:    R knee pain:  History of chronic osteoarthritis, "wearing and tearing of the R knee."  Problem was not addressed at the last visit and it has gotten worse.   Has had 2 steroid injection in the R knee.  Knee always pops and catches.  Much worse with walking--both the knee and the bottom of the feet worse.  Especially bad when she wears flat shoes.  More tender in the meniscal area.  No fluid accumulation in the joint space.  Denies recent infections and fever.  Also notes tenderness at the prepatellar bursa.  Alleve does not relieve the pain.    Anemia:  Treated with Fe deficiency anemia in Dec 2014 and given Fe therapy.  She is taking Fe and feels less fatigued.  The following portions of the patient's history were reviewed and updated as appropriate: allergies, current medications, past medical history, family and social history, and problem list.  Patient is a nonsmoker.  Past Medical History  Diagnosis Date  . Back pain   . Anemia   . Migraines   . Knee pain   . S/P tubal ligation   . S/P bilateral breast reduction     ROS as above otherwise neg.    Medications reviewed. Current Outpatient Prescriptions  Medication Sig Dispense Refill  . clonazePAM (KLONOPIN) 0.5 MG tablet Take 1 tablet (0.5 mg total) by mouth 2 (two) times daily as needed for anxiety.  28 tablet  0  . diclofenac (VOLTAREN) 75 MG EC tablet Take 1 tablet (75 mg total) by mouth 2 (two) times daily.  60 tablet  0  . doxycycline (VIBRA-TABS) 100 MG tablet Take 1 tablet (100 mg total) by mouth 2 (two) times daily.  20 tablet  0  . ferrous sulfate (FER-IN-SOL) 75 (15 FE) MG/ML SOLN Take 1 mL (15 mg of iron total) by mouth daily. With breakfast.  1 Bottle  3  . FLUoxetine (PROZAC) 40 MG capsule Take 1 capsule (40 mg total) by mouth daily.  30 capsule  3  . pantoprazole (PROTONIX) 40 MG tablet Take 1 tablet (40 mg total) by mouth daily.   30 tablet  1  . phentermine 37.5 MG capsule Take 37.5 mg by mouth every morning. Part of weight loss program. Dr. Theodoro Kos is overseeing       No current facility-administered medications for this visit.    Exam: BP 102/69  Pulse 97  Temp(Src) 98.2 F (36.8 C) (Oral)  Wt 274 lb (124.286 kg) Gen: Well NAD HEENT: EOMI,  MMM MSK: R knee w/ FROm, no effusion but ttp along the medial joint line. Crepitus bilat in knees. Lachmann's and valgus/varus stresses w/o pain  No results found for this or any previous visit (from the past 72 hour(s)).  A/P (as seen in Problem list)  Anemia Taking iron as prescribed Feeling much better CBC today  Osteoarthritis of right knee Continued pain Pt needs ortho referral due to likely progression of meniscal disease causing arthritic pain Also w/ Chondromalacia Steroid injection today A/P Lat, 30 degree flexion wt bearing and sunrise films today Diclofenac   Procedure: R knee inj Consent signed and scanned into record. Medication:  80mg  depomedrol 4 cc Lidocaine !% without epi Preparation: area cleansed with alcohol and betadine Time Out taken  Injection  Landmarks identified 4 cc of medication injected into joint space using a lateral approach  Patient tolerated well without bleeding or paresthesias  Patient had good range of motion of joint after injection      Review of Systems Physical Exam

## 2013-06-02 NOTE — Telephone Encounter (Signed)
Called pt who is on her way to the clinic  Christina Flattenavid Hetty Linhart, MD Family Medicine PGY-3 06/02/2013, 8:54 AM

## 2013-06-02 NOTE — Assessment & Plan Note (Signed)
Continued pain Pt needs ortho referral due to likely progression of meniscal disease causing arthritic pain Also w/ Chondromalacia Steroid injection today A/P Lat, 30 degree flexion wt bearing and sunrise films today Diclofenac

## 2013-06-02 NOTE — Assessment & Plan Note (Signed)
Taking iron as prescribed Feeling much better CBC today

## 2013-06-03 ENCOUNTER — Telehealth: Payer: Self-pay | Admitting: Family Medicine

## 2013-06-03 DIAGNOSIS — D649 Anemia, unspecified: Secondary | ICD-10-CM

## 2013-06-03 DIAGNOSIS — M1711 Unilateral primary osteoarthritis, right knee: Secondary | ICD-10-CM

## 2013-06-03 NOTE — Assessment & Plan Note (Signed)
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

## 2013-06-03 NOTE — Assessment & Plan Note (Addendum)
Likely iron defficient from nutritional deficiency vs slow blood loss Heavy periods regularly lasting 4 days w/ soaking a super pad every few hours. Periods are regular Denies melanic stool or BRBPR Only taking iron QOD Pt to inc iron to QD In future consider progesterone replacement (depo, provera, other) Consider inc iron to TID Stool Guiac cards at next visit

## 2013-06-03 NOTE — Telephone Encounter (Signed)
Called pt to discuss Anemia  Heavy periods regularly lasting 4 days w/ soaking a super pad every few hours. Periods are regular Denies melanic stool or BRBPR Only taking iron QOD Pt to inc iron to QD In future consider progesterone replacement (depo, provera, other) Consider inc iron to TID Stool Guiac cards at next visit   Shelly Flattenavid Azaryah Oleksy, MD Family Medicine PGY-3 06/03/2013, 9:21 AM

## 2013-06-03 NOTE — Telephone Encounter (Signed)
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

## 2013-07-23 ENCOUNTER — Ambulatory Visit (INDEPENDENT_AMBULATORY_CARE_PROVIDER_SITE_OTHER): Payer: Managed Care, Other (non HMO) | Admitting: Family Medicine

## 2013-07-23 ENCOUNTER — Encounter: Payer: Self-pay | Admitting: Family Medicine

## 2013-07-23 VITALS — BP 125/86 | HR 80 | Temp 98.4°F | Wt 273.0 lb

## 2013-07-23 DIAGNOSIS — D649 Anemia, unspecified: Secondary | ICD-10-CM

## 2013-07-23 DIAGNOSIS — M549 Dorsalgia, unspecified: Secondary | ICD-10-CM

## 2013-07-23 DIAGNOSIS — IMO0001 Reserved for inherently not codable concepts without codable children: Secondary | ICD-10-CM

## 2013-07-23 DIAGNOSIS — R350 Frequency of micturition: Secondary | ICD-10-CM

## 2013-07-23 LAB — POCT URINALYSIS DIPSTICK
Bilirubin, UA: NEGATIVE
Glucose, UA: NEGATIVE
Ketones, UA: NEGATIVE
Leukocytes, UA: NEGATIVE
Nitrite, UA: NEGATIVE
Spec Grav, UA: 1.02
Urobilinogen, UA: 1
pH, UA: 7

## 2013-07-23 LAB — POCT UA - MICROSCOPIC ONLY

## 2013-07-23 LAB — CBC
HCT: 27.8 % — ABNORMAL LOW (ref 36.0–46.0)
Hemoglobin: 8.5 g/dL — ABNORMAL LOW (ref 12.0–15.0)
MCH: 19 pg — ABNORMAL LOW (ref 26.0–34.0)
MCHC: 30.6 g/dL (ref 30.0–36.0)
MCV: 62.2 fL — ABNORMAL LOW (ref 78.0–100.0)
Platelets: 363 10*3/uL (ref 150–400)
RBC: 4.47 MIL/uL (ref 3.87–5.11)
RDW: 17.8 % — ABNORMAL HIGH (ref 11.5–15.5)
WBC: 7.1 10*3/uL (ref 4.0–10.5)

## 2013-07-23 MED ORDER — METAXALONE 800 MG PO TABS: 800.0000 mg | ORAL_TABLET | Freq: Three times a day (TID) | ORAL | Status: DC | PRN

## 2013-07-23 MED ORDER — TRAMADOL HCL 50 MG PO TABS: 50.0000 mg | ORAL_TABLET | Freq: Three times a day (TID) | ORAL | Status: DC | PRN

## 2013-07-23 NOTE — Assessment & Plan Note (Signed)
Back pain w/ anterior radiation today,. Muscle spasm vs UTI given frequency and general ill feeling  Tramadol Skelaxin UA

## 2013-07-23 NOTE — Progress Notes (Signed)
Christina Sparks is a 43 y.o. female who [facility]  Low back pain. Started 2 wks ago. Radiates to belly. Hurts w/ movement. Takes breath away. Getting worse. No trauma. Worse w/ cycle. Denies bowel or bladder incontinence. Denies falls or change in sensation. No dysuria but new frequency and not finishing urination stream. Only drinking 16oz daily.   Still feelign fatigued. Taking daily iron.   The following portions of the patient's history were reviewed and updated as appropriate: allergies, current medications, past medical history, family and social history, and problem list.  Patient is a nonsmoker.  Past Medical History  Diagnosis Date  . Back pain   . Anemia   . Migraines   . Knee pain   . S/P tubal ligation   . S/P bilateral breast reduction     ROS as above otherwise neg.    Medications reviewed. Current Outpatient Prescriptions  Medication Sig Dispense Refill  . clonazePAM (KLONOPIN) 0.5 MG tablet Take 1 tablet (0.5 mg total) by mouth 2 (two) times daily as needed for anxiety.  28 tablet  0  . diclofenac (VOLTAREN) 75 MG EC tablet Take 1 tablet (75 mg total) by mouth 2 (two) times daily.  60 tablet  0  . doxycycline (VIBRA-TABS) 100 MG tablet Take 1 tablet (100 mg total) by mouth 2 (two) times daily.  20 tablet  0  . ferrous sulfate (FER-IN-SOL) 75 (15 FE) MG/ML SOLN Take 1 mL (15 mg of iron total) by mouth daily. With breakfast.  1 Bottle  3  . FLUoxetine (PROZAC) 40 MG capsule Take 1 capsule (40 mg total) by mouth daily.  30 capsule  3  . metaxalone (SKELAXIN) 800 MG tablet Take 1 tablet (800 mg total) by mouth 3 (three) times daily as needed for muscle spasms.  30 tablet  1  . pantoprazole (PROTONIX) 40 MG tablet Take 1 tablet (40 mg total) by mouth daily.  30 tablet  1  . phentermine 37.5 MG capsule Take 37.5 mg by mouth every morning. Part of weight loss program. Dr. Theodoro KosGirouard is overseeing      . traMADol (ULTRAM) 50 MG tablet Take 1 tablet (50 mg  total) by mouth every 8 (eight) hours as needed.  30 tablet  0   No current facility-administered medications for this visit.    Exam: BP 125/86  Pulse 80  Temp(Src) 98.4 F (36.9 C) (Oral)  Wt 273 lb (123.832 kg)  LMP 07/18/2013 Gen: Well NAD HEENT: EOMI,  MMM MSK: lower perspinal tightness. FRom. No effusion or bony abnormality. No suprapubic pain or tenderness.   No results found for this or any previous visit (from the past 72 hour(s)).  A/P (as seen in Problem list)  Anemia On iron. Continue worsenign ftigue after period Check CBC today  ------------------------------------------ Addendum  Worsening iron deficiency.  Pt to include Vit C w/ iron and double iron intake Stool hemocult cards ordered - pt to stop iron for 2 days prior to using cards - need to ensure no GI loss.    Back pain Back pain w/ anterior radiation today,. Muscle spasm vs UTI given frequency and general ill feeling  Tramadol Skelaxin UA

## 2013-07-23 NOTE — Patient Instructions (Addendum)
You are doing well overall.  Your pain is likely from muscle spasm but may be due to a urinary tract infection Pelase start the skelaxin and tramadol for pain I will let you know the results of your blood work.

## 2013-07-23 NOTE — Assessment & Plan Note (Addendum)
On iron. Continue worsenign ftigue after period Check CBC today  ------------------------------------------ Addendum  Worsening iron deficiency.  Pt to include Vit C w/ iron and double iron intake Stool hemocult cards ordered - pt to stop iron for 2 days prior to using cards - need to ensure no GI loss.

## 2013-07-28 ENCOUNTER — Ambulatory Visit: Payer: Managed Care, Other (non HMO) | Admitting: Family Medicine

## 2013-07-29 ENCOUNTER — Ambulatory Visit: Payer: Managed Care, Other (non HMO) | Admitting: Family Medicine

## 2013-12-09 ENCOUNTER — Ambulatory Visit: Payer: Managed Care, Other (non HMO) | Admitting: Family Medicine

## 2013-12-29 ENCOUNTER — Ambulatory Visit (INDEPENDENT_AMBULATORY_CARE_PROVIDER_SITE_OTHER): Payer: Medicaid Other | Admitting: Family Medicine

## 2013-12-29 ENCOUNTER — Encounter: Payer: Self-pay | Admitting: Family Medicine

## 2013-12-29 VITALS — BP 136/83 | HR 74 | Temp 98.3°F | Ht 67.0 in | Wt 268.0 lb

## 2013-12-29 DIAGNOSIS — Z5181 Encounter for therapeutic drug level monitoring: Secondary | ICD-10-CM

## 2013-12-29 DIAGNOSIS — M722 Plantar fascial fibromatosis: Secondary | ICD-10-CM

## 2013-12-29 LAB — BASIC METABOLIC PANEL
BUN: 9 mg/dL (ref 6–23)
CO2: 24 mEq/L (ref 19–32)
Calcium: 8.3 mg/dL — ABNORMAL LOW (ref 8.4–10.5)
Chloride: 106 mEq/L (ref 96–112)
Creat: 0.77 mg/dL (ref 0.50–1.10)
Glucose, Bld: 95 mg/dL (ref 70–99)
Potassium: 4.2 mEq/L (ref 3.5–5.3)
Sodium: 138 mEq/L (ref 135–145)

## 2013-12-29 MED ORDER — DICLOFENAC SODIUM 75 MG PO TBEC: 75.0000 mg | DELAYED_RELEASE_TABLET | Freq: Two times a day (BID) | ORAL | Status: DC

## 2013-12-29 NOTE — Patient Instructions (Signed)
Nice to meet you. Please take the diclofenac twice a day for 7 days.  You should do the provided exercises twice a day.  You can use a frozen water bottle to ice the area. Please roll this back and forth under your foot.  You should come back in 2-3 weeks to see Dr Waynetta SandyWight for follow-up on this issue.

## 2013-12-30 ENCOUNTER — Encounter: Payer: Self-pay | Admitting: Family Medicine

## 2013-12-30 DIAGNOSIS — M722 Plantar fascial fibromatosis: Secondary | ICD-10-CM | POA: Insufficient documentation

## 2013-12-30 NOTE — Assessment & Plan Note (Addendum)
Patient with signs and symptoms of plantar fasciitis. She was given exercises to perform. She was advised on using a frozen bottle to roll under her feet to help with inflammation. She will use diclofenac BID for 7 days to help with pain and inflammation. Advised to take diclofenac with food. She will f/u in 2 weeks with PCP. [facility] PT [facility] if not improving.

## 2013-12-30 NOTE — Progress Notes (Signed)
Patient ID: Christina Sparks, female   DOB: [birth year], 43 y.o.   MRN: [id number]  Christina AlarEric Senovia Gauer, MD Phone: [phone]  Christina Sparks is a 43 y.o. female who presents today for same day appointment.  Foot pain: patient presents with complaint of bilateral foot pain. This is in the bottom of her foot. It is burining and throbbing. Notes it is worse in the morning when she first steps out of bed and improves after walking on her feet. Will be worse after sitting for a period of time then stepping on her feet. Excruciating in the am. Has had these symptoms previously. Notes taking diclofenac for this with benefit. BC powder did not help. Has been doing online exercises with minimal benefit.   Patient is a nonsmoker.   ROS: Per HPI   Physical Exam Filed Vitals:   12/29/13 1019  BP: 136/83  Pulse: 74  Temp: 98.3 F (36.8 C)    Gen: Well NAD MSK: no swelling or erythema of bilateral feet, there is tenderness of the proximal insertion of the plantar fascia, there is no other tenderness of the foot, negative calcaneal squeeze, 5/5 strength in inversion, eversion, dorsiflexion, and plantarflexion, 2+ DP pulses, sensation to light touch intact   Assessment/Plan: Please see individual problem list.   Christina AlarEric Bernadette Gores, MD [facility] PGY-3

## 2014-01-04 ENCOUNTER — Encounter: Payer: Self-pay | Admitting: Family Medicine

## 2014-01-25 ENCOUNTER — Encounter: Payer: Self-pay | Admitting: Family Medicine

## 2014-01-26 ENCOUNTER — Ambulatory Visit: Payer: Medicaid Other | Admitting: Family Medicine

## 2014-04-01 ENCOUNTER — Telehealth: Payer: Self-pay | Admitting: Family Medicine

## 2014-04-01 ENCOUNTER — Encounter: Payer: Self-pay | Admitting: Family Medicine

## 2014-04-01 ENCOUNTER — Other Ambulatory Visit: Payer: Self-pay | Admitting: *Deleted

## 2014-04-01 ENCOUNTER — Ambulatory Visit (INDEPENDENT_AMBULATORY_CARE_PROVIDER_SITE_OTHER): Payer: Medicaid Other | Admitting: Family Medicine

## 2014-04-01 VITALS — BP 141/89 | HR 85 | Temp 98.1°F | Wt 278.0 lb

## 2014-04-01 DIAGNOSIS — M722 Plantar fascial fibromatosis: Secondary | ICD-10-CM

## 2014-04-01 DIAGNOSIS — D649 Anemia, unspecified: Secondary | ICD-10-CM

## 2014-04-01 LAB — CBC
HCT: 26.8 % — ABNORMAL LOW (ref 36.0–46.0)
Hemoglobin: 7.7 g/dL — ABNORMAL LOW (ref 12.0–15.0)
MCH: 17.2 pg — ABNORMAL LOW (ref 26.0–34.0)
MCHC: 28.7 g/dL — ABNORMAL LOW (ref 30.0–36.0)
MCV: 59.8 fL — ABNORMAL LOW (ref 78.0–100.0)
MPV: 8.8 fL (ref 8.6–12.4)
Platelets: 402 10*3/uL — ABNORMAL HIGH (ref 150–400)
RBC: 4.48 MIL/uL (ref 3.87–5.11)
RDW: 17.6 % — ABNORMAL HIGH (ref 11.5–15.5)
WBC: 7.6 10*3/uL (ref 4.0–10.5)

## 2014-04-01 MED ORDER — DICLOFENAC SODIUM 75 MG PO TBEC: 75.0000 mg | DELAYED_RELEASE_TABLET | Freq: Two times a day (BID) | ORAL | Status: DC | PRN

## 2014-04-01 MED ORDER — FERROUS SULFATE 75 (15 FE) MG/ML PO SOLN: 15.0000 mg | Freq: Every day | ORAL | Status: DC

## 2014-04-01 NOTE — Telephone Encounter (Signed)
Will send in diclofenac. Advised patient of this and to discontinue use if this upsets her stomach. She voiced understanding.

## 2014-04-01 NOTE — Patient Instructions (Addendum)
Nice to see you. We will refer you to [facility] for evaluation of plantar fasciitis.  Please schedule an appointment with Dr Waynetta SandyWight for follow-up of your anemia.  We will call with the results of your blood count. If you have shortness of breath or chest pain please seek medical attention.

## 2014-04-01 NOTE — Assessment & Plan Note (Addendum)
Patient with iron deficiency anemia, though has not been taking her iron supplements recently. Has developed increasing fatigue. Is hemodynamically stable. Will check CBC to evaluate Hgb level. Refilled iron supplement. Will need to follow-up with PCP for this issue. Discussed return precautions with the patient.   Addendum: patients Hgb returned at 7.7. This is lower than previously. On review of the chart it appears that the only work-up previously completed has been an iron panel. She will need to return to clinic for a pelvic exam, repeat CBC, and stool cards on Monday to start her work up for her anemia. She may benefit from having a pelvic US to define her anatomy to make sure there is not a structural lesion leading to her heavy bleeding. She may also benefit from work up for bleeding disorder, though suspect this could come after the above work up if there is no cause found.

## 2014-04-01 NOTE — Assessment & Plan Note (Signed)
Not improved with conservative treatment. Patient requesting referral to podiatry for possible injection. Referral placed.

## 2014-04-01 NOTE — Telephone Encounter (Signed)
Will forward to MD to see. Tasman Zapata,CMA

## 2014-04-01 NOTE — Telephone Encounter (Signed)
Pt called and said that the foot center can not get her in for a visit until 1/18. She doesn't think that she will be able to take the pain until then. She was hoping that the doctor she seen today could call in something for inflammation or pain or even both. jw

## 2014-04-01 NOTE — Telephone Encounter (Signed)
Pt was in clinic today for same day foot pain.  Asked if we could get a message to her PCP for refill on her iron medication. Refilled by Dr. Birdie Sonssonnenberg and an appt to follow up with PCP made . Dierra Riesgo,CMA

## 2014-04-01 NOTE — Progress Notes (Signed)
Patient ID: Christina Sparks, female   DOB: 15-Mar-1971, 44 y.o.   MRN: [id number]  Christina AlarEric Erielle Gawronski, MD Phone: [phone]  Christina Sparks is a 44 y.o. female who presents today for same day visit.  Plantar fasciitis: bilateral plantar foot pain has not improved with ice, exercises, or antiinflammatories. Has sharp pains worse when she gets up in the morning, though now any time she gets back on to her feet she has sharp pain in her posterior part of the plantar surface of her foot. Also throbs when she is off her feet.   Anemia: As provider was leaving the room the patient stated that she needed a refill on her iron since she has not been taking it for some time. She notes increased fatigue with activities while not being on this. She notes this most on walking up stairs. She denies chest pain and orthopnea. She notes this issue has been going on for over a year. She states she has been told this was previously related to her heavy periods. She states she soaks 5-6 pads a day when on her period. Her periods occur every 28 days. They last for 5 days. She is not on birth control as her tubes are tied. She is not currently bleeding.  Patient is a nonsmoker.   ROS: Per HPI   Physical Exam Filed Vitals:   04/01/14 0835  BP: 141/89  Pulse: 85  Temp: 98.1 F (36.7 C)    Gen: Well NAD HEENT: PERRL,  MMM Lungs: CTABL Nl WOB Heart: RRR  MSK: there is tenderness to palpation of bilateral plantar aspects of her feet mostly towards the heel, she does not have tenderness else where in her feet, she has normal sensation to light touch in her feet bilaterally, she has no calf swelling, she has no bony tenderness of her feet or ankles Exts: Non edematous BL  LE, warm and well perfused.    Assessment/Plan: Please see individual problem list.  Christina AlarEric Chrysta Fulcher, MD [facility] PGY-3

## 2014-04-02 ENCOUNTER — Telehealth: Payer: Self-pay | Admitting: *Deleted

## 2014-04-02 NOTE — Telephone Encounter (Signed)
-----   Message from Glori LuisEric G Sonnenberg, MD sent at 04/02/2014  9:27 AM EST ----- Please call the patient to inform her that her hemoglobin is down from previously. It is now 7.7. This is quite low, though not to the point that she would require hospitalization for this. I would like for her to come in next Monday for further work up of this in the office including a pelvic exam and repeat CBC. She can be seen by anyone that has available appointments. Thanks.

## 2014-04-02 NOTE — Telephone Encounter (Signed)
Pt is aware of results and appointment made for Monday with Dr. Paulina FusiHess.  If you would like to make him aware of the situation. Jayni Prescher,CMA

## 2014-04-05 ENCOUNTER — Encounter: Payer: Self-pay | Admitting: Family Medicine

## 2014-04-05 ENCOUNTER — Ambulatory Visit (INDEPENDENT_AMBULATORY_CARE_PROVIDER_SITE_OTHER): Payer: Medicaid Other | Admitting: Family Medicine

## 2014-04-05 VITALS — BP 141/77 | HR 79 | Temp 98.2°F | Ht 67.0 in | Wt 278.0 lb

## 2014-04-05 DIAGNOSIS — D649 Anemia, unspecified: Secondary | ICD-10-CM

## 2014-04-05 LAB — POCT HEMOGLOBIN: Hemoglobin: 8 g/dL — AB (ref 12.2–16.2)

## 2014-04-05 LAB — IBC PANEL
%SAT: 3 % — ABNORMAL LOW (ref 20–55)
TIBC: 426 ug/dL (ref 250–470)
UIBC: 413 ug/dL — ABNORMAL HIGH (ref 125–400)

## 2014-04-05 LAB — IRON: Iron: 13 ug/dL — ABNORMAL LOW (ref 42–145)

## 2014-04-05 LAB — FERRITIN: Ferritin: 7 ng/mL — ABNORMAL LOW (ref 10–291)

## 2014-04-05 NOTE — Assessment & Plan Note (Signed)
Most likely Fe deficiency from some menorrhagia - Will obtain Transvaginal and transabdominal US to evaluate for possible etiology.  No FHx or personal hx of uterine/ovarian CA or possible malignancy causing this - Will obtain iron panel today to evaluate for description of her anemia, which i expect her ferritin to be extremely low along with her Fe.   - Pending results from US, she may need OB evaluation for further evaluation with hysteroscopy, etc - FOBT cards given for possible other cause - F/U in 2 weeks to discuss results.  - Would consider switching her Fe to ferrous sulfate 325 mg (active 65) to increase her elemental iron as I expect her Fe to be quite low.

## 2014-04-05 NOTE — Addendum Note (Signed)
Addended by: Farrell OursEVANS, Audie Wieser K on: 04/05/2014 09:58 AM   Modules accepted: Orders

## 2014-04-05 NOTE — Patient Instructions (Signed)
Please have your labs and ultrasounds completed at your convenience.   We will see you back in about 2 weeks to discuss your results.  Thanks, Dr. Paulina Fusi   Iron Chewable Tablets What is this medicine? IRON (AHY ern) replaces iron that is essential to healthy red blood cells. Iron is used to treat iron deficiency anemia. Anemia may cause problems like tiredness, shortness of breath, or slowed growth in children. Only take iron if your doctor has told you to. Do not treat yourself with iron if you are feeling tired. Most healthy people get enough iron in their diets, particularly if they eat cereals, meat, poultry, and fish. This medicine may be used for other purposes; ask your health care provider or pharmacist if you have questions. COMMON BRAND NAME(S): [facility] What should I tell my health care provider before I take this medicine? They need to know if you have any of these conditions: -frequently drink alcohol -bowel disease -hemolytic anemia -iron overload (hemochromatosis, hemosiderosis) -liver disease -problems with swallowing -stomach ulcer or other stomach problems -an unusual or allergic reaction to iron, other medicines, foods, dyes, or preservatives -pregnant or trying to get pregnant -breast-feeding How should I use this medicine? Take this medicine by mouth. Chew it completely before swallowing. Follow the directions on the prescription label. Take this medicine in an upright or sitting position. Try to take any bedtime doses at least 10 minutes before lying down. You may take this medicine with food. Take your medicine at regular intervals. Do not take your medicine more often than directed. Do not stop taking except on your doctor's advice. Talk to your pediatrician regarding the use of this medicine in children. While this drug may be prescribed for even very young children for selected conditions, precautions do apply. Overdosage: If you think you have taken too much  of this medicine contact a poison control center or emergency room at once. NOTE: This medicine is only for you. Do not share this medicine with others. What if I miss a dose? If you miss a dose, take it as soon as you can. If it is almost time for your next dose, take only that dose. Do not take double or extra doses. What may interact with this medicine? If you are taking this iron product, you should not take iron in any other medicine or dietary supplement. This medicine may also interact with the following medications: -alendronate -antacids -cefdinir -chloramphenicol -cholestyramine -deferoxamine -dimercaprol -etidronate -medicines for stomach ulcers or other stomach problems -pancreatic enzymes -quinolone antibiotics (examples: Cipro, Floxin, Levaquin, Tequin and others) -risedronate -tetracycline antibiotics (examples: doxycycline, tetracycline, minocycline, and others) -thyroid hormones This list may not describe all possible interactions. Give your health care provider a list of all the medicines, herbs, non-prescription drugs, or dietary supplements you use. Also tell them if you smoke, drink alcohol, or use illegal drugs. Some items may interact with your medicine. What should I watch for while using this medicine? Use iron supplements only as directed by your health care professional. Bonita Quin will need important blood work while you are taking this medicine. It may take 3 to 6 months of therapy to treat low iron levels. Pregnant women should follow the dose and length of iron treatment as directed by their doctors. Do not use iron longer than prescribed, and do not take a higher dose than recommended. Long-term use may cause excess iron to build-up in the body. Do not take iron with antacids. If you need to take  an antacid, take it 2 hours after a dose of iron. What side effects may I notice from receiving this medicine? Side effects that you should report to your doctor or health  care professional as soon as possible: -allergic reactions like skin rash, itching or hives, swelling of the face, lips, or tongue -blue lips, nails, or palms -dark colored stools (this may be due to the iron, but can indicate a more serious condition) -drowsiness -pain with or difficulty swallowing -pale or clammy skin -seizures -stomach pain -unusually weak or tired -vomiting -weak, fast, or irregular heartbeat Side effects that usually do not require medical attention (report to your doctor or health care professional if they continue or are bothersome): -constipation -indigestion -nausea or stomach upset This list may not describe all possible side effects. Call your doctor for medical advice about side effects. You may report side effects to FDA at 1-800-FDA-1088. Where should I keep my medicine? Keep out of the reach of children. Even small amounts of iron can be harmful to a child. Store at room temperature between 15 and 30 degrees C (59 and 86 degrees F). Keep container tightly closed. Throw away any unused medicine after the expiration date. NOTE: This sheet is a summary. It may not cover all possible information. If you have questions about this medicine, talk to your doctor, pharmacist, or health care provider.  2015, Elsevier/Gold Standard. (2007-07-31 17:08:28)

## 2014-04-05 NOTE — Progress Notes (Signed)
Minus Christina Sparks is a 44 y.o. female who presents today for menorrhagia.   Menorrhagia - Pt states this has been ongoing now for about 1-1.5 yrs.  She was dx with Fe deficiency anemia years ago due to heavy periods.  She does have history of menometrorrhagia that was dx as teenager but improved after she had her first child.  Since about 1 yr ago, she states she has had prolonged menses to about 7 days with increased pad usage to about 7 pads per day.  Denies any changes in diet or lifestyle at that time.  She does endorse some pagophagia at that time along with salt pica.  Does notice some LE edema as well that has worsened since her heavy periods.  Has not taken iron for about 4-5 months now but was on it previously.  Denies CP/SOB, HA blurred vision, diplopia.    Past Medical History  Diagnosis Date  . Back pain   . Anemia   . Migraines   . Knee pain   . S/P tubal ligation   . S/P bilateral breast reduction     History  Smoking status  . Never Smoker   Smokeless tobacco  . Not on file    Family History  Problem Relation Age of Onset  . Hypertension Mother   . Heart disease Father   . Hyperlipidemia Father     Current Outpatient Prescriptions on File Prior to Visit  Medication Sig Dispense Refill  . clonazePAM (KLONOPIN) 0.5 MG tablet Take 1 tablet (0.5 mg total) by mouth 2 (two) times daily as needed for anxiety. 28 tablet 0  . diclofenac (VOLTAREN) 75 MG EC tablet Take 1 tablet (75 mg total) by mouth 2 (two) times daily as needed. 60 tablet 0  . doxycycline (VIBRA-TABS) 100 MG tablet Take 1 tablet (100 mg total) by mouth 2 (two) times daily. 20 tablet 0  . ferrous sulfate (FER-IN-SOL) 75 (15 FE) MG/ML SOLN Take 1 mL (15 mg of iron total) by mouth daily. With breakfast. 1 Bottle 3  . FLUoxetine (PROZAC) 40 MG capsule Take 1 capsule (40 mg total) by mouth daily. 30 capsule 3  . metaxalone (SKELAXIN) 800 MG tablet Take 1 tablet (800 mg total) by mouth 3 (three) times daily as  needed for muscle spasms. 30 tablet 1  . pantoprazole (PROTONIX) 40 MG tablet Take 1 tablet (40 mg total) by mouth daily. 30 tablet 1  . phentermine 37.5 MG capsule Take 37.5 mg by mouth every morning. Part of weight loss program. Dr. Theodoro KosGirouard is overseeing    . traMADol (ULTRAM) 50 MG tablet Take 1 tablet (50 mg total) by mouth every 8 (eight) hours as needed. 30 tablet 0   No current facility-administered medications on file prior to visit.    ROS: Per HPI.  All other systems reviewed and are negative.   Physical Exam Filed Vitals:   04/05/14 0928  BP: 141/77  Pulse: 79  Temp: 98.2 F (36.8 C)    Physical Examination: General appearance - alert, well appearing, and in no distress Eyes - Pale conjunctiva B/L  Mouth - + Glossitis Chest - clear to auscultation, no wheezes, rales or rhonchi, symmetric air entry Heart - normal rate and regular rhythm, systolic murmur 2/6 flow murmur  Abdomen - soft, nontender, nondistended, no masses or organomegaly Extremities - + Koilonychia B/L     Chemistry      Component Value Date/Time   NA 138 12/29/2013 1057   K  4.2 12/29/2013 1057   CL 106 12/29/2013 1057   CO2 24 12/29/2013 1057   BUN 9 12/29/2013 1057   CREATININE 0.77 12/29/2013 1057   CREATININE 0.80 04/14/2011 2023      Component Value Date/Time   CALCIUM 8.3* 12/29/2013 1057   ALKPHOS 66 08/31/2011 1527   AST 17 08/31/2011 1527   ALT 14 08/31/2011 1527   BILITOT 0.2* 08/31/2011 1527      Lab Results  Component Value Date   WBC 7.6 04/01/2014   HGB 8.0* 04/05/2014   HCT 26.8* 04/01/2014   MCV 59.8* 04/01/2014   PLT 402* 04/01/2014   Iron/TIBC/Ferritin/ %Sat    Component Value Date/Time   IRON 24* 08/31/2011 1527   TIBC 404 08/31/2011 1527   FERRITIN 10 08/31/2011 1527   IRONPCTSAT 6* 08/31/2011 1527

## 2014-04-06 LAB — TRANSFERRIN: Transferrin: 320 mg/dL (ref 188–341)

## 2014-04-12 ENCOUNTER — Ambulatory Visit (INDEPENDENT_AMBULATORY_CARE_PROVIDER_SITE_OTHER): Payer: Medicaid Other | Admitting: Podiatry

## 2014-04-12 ENCOUNTER — Ambulatory Visit (HOSPITAL_COMMUNITY): Admission: RE | Admit: 2014-04-12 | Payer: Medicaid Other | Source: Ambulatory Visit

## 2014-04-12 ENCOUNTER — Ambulatory Visit (INDEPENDENT_AMBULATORY_CARE_PROVIDER_SITE_OTHER): Payer: Medicaid Other

## 2014-04-12 DIAGNOSIS — M722 Plantar fascial fibromatosis: Secondary | ICD-10-CM

## 2014-04-12 DIAGNOSIS — M79673 Pain in unspecified foot: Secondary | ICD-10-CM

## 2014-04-12 MED ORDER — TRIAMCINOLONE ACETONIDE 10 MG/ML IJ SUSP
10.0000 mg | Freq: Once | INTRAMUSCULAR | Status: AC
  Administered 2014-04-12: 10 mg

## 2014-04-12 NOTE — Progress Notes (Signed)
   Subjective:    Patient ID: Christina Sparks, female    DOB: 1970/09/13, 44 y.o.   MRN: [id number]  HPI Comments: "My feet hurt on the bottom"  44 year old female presents the office today with complaints of pain on the bottom of both of her feet, mostly in the heel. She states that this has been ongoing for approximate 6 months. She's been applying ice to the area as well as taking Voltaren. She states that she has pain after periods of rest or in the morning. She states that she has throbbing to sensation to her heels after standing on them all day. She denies any history of injury or trauma to the area. She states that they have started after developing some knee problems. She also states that her weight has fluctuated which may be extremity factors well. No other complaints at this time.   Foot Pain      Review of Systems  Musculoskeletal: Positive for gait problem.  All other systems reviewed and are negative.      Objective:   Physical Exam AAO x3, NAD DP/PT pulses palpable bilaterally, CRT less than 3 seconds Protective sensation intact with Simms Weinstein monofilament, vibratory sensation intact, Achilles tendon reflex intact Tenderness to palpation overlying the plantar medial tubercle of the calcaneus at the insertion the plantar fascia bilaterally. There is no pain with lateral compression of the calcaneus or pain with vibratory sensation. No pain on the posterior aspect of the calcaneus or along the course/insertion of the Achilles tendon. There is mild discomfort along the medial band of the plantar fascial in the arch of the foot. Plantar fascia appears to be intact. No other areas of tenderness to bilateral lower extremities. No overlying edema, erythema, increase in warmth of bilateral feet. Decrease in medial arch height upon weightbearing bilaterally with equinus present. MMT 5/5, ROM WNL No open lesions or pre-ulcerative lesions identified. No pain with calf  compression, swelling, warmth, erythema.      Assessment & Plan:  44 year old female with bilateral heel pain/arch pain -X-rays were obtained and reviewed with the patient. -Treatment options were discussed including alternatives, risks, complications. -Patient elects to proceed with steroid injection into the bilateral plantar heels. Under sterile skin preparation, a total of 2.5cc of kenalog 10, 0.5% Marcaine plain, and 2% lidocaine plain were infiltrated into the symptomatic area without complication. A band-aid was applied. Patient tolerated the injection well without complication. Post-injection care with discussed with the patient. Discussed with the patient to ice the area over the next couple of days to help prevent a steroid flare.  -Dispensed plantar fascial braces. -Ice to the area. -Discussed stretching exercises. -Continue voltaren for which she already has a prescription. -Discussed shoe gear modifications and possible orthotics. -Follow-up in 3 weeks or sooner should any palms arise. In the meantime, encouraged call the office with any questions, concerns, change in symptoms.

## 2014-04-12 NOTE — Patient Instructions (Signed)

## 2014-04-30 ENCOUNTER — Ambulatory Visit: Payer: Managed Care, Other (non HMO) | Admitting: Family Medicine

## 2014-05-05 ENCOUNTER — Ambulatory Visit: Payer: Medicaid Other | Admitting: Podiatry

## 2014-06-01 ENCOUNTER — Telehealth: Payer: Self-pay | Admitting: Family Medicine

## 2014-06-01 DIAGNOSIS — M25562 Pain in left knee: Secondary | ICD-10-CM

## 2014-06-01 NOTE — Telephone Encounter (Signed)
Needs referral to [facility] for left knee problems Knee is swollen Please advise Would like to have an appt today

## 2014-06-01 NOTE — Telephone Encounter (Signed)
Patient would like referral appt.for Christina Sparks today if possible. She stated that she called them yesterday and was told to call her PCP to schedule her appt. She has seen them for right knee problems before (most recently 1 month ago). She stated that her left knee started bothering her 3-4 days and swelling started yesterday with some redness and has some decreased ROM bending it and straightening leg and feels real tight. She would like referral. I spoke with Jazmin and she stated we will contact Christina Sparks to see if they have any openings today and call her back. Her last referral for Christina Sparks was 05/2013. Please advise. Christina Sparks, CMA.

## 2014-06-01 NOTE — Telephone Encounter (Signed)
Referral order placed.

## 2014-06-25 ENCOUNTER — Ambulatory Visit (INDEPENDENT_AMBULATORY_CARE_PROVIDER_SITE_OTHER): Payer: Medicaid Other | Admitting: Family Medicine

## 2014-06-25 ENCOUNTER — Encounter: Payer: Self-pay | Admitting: Family Medicine

## 2014-06-25 VITALS — BP 119/77 | HR 86 | Temp 98.1°F | Ht 67.0 in | Wt 267.0 lb

## 2014-06-25 DIAGNOSIS — R059 Cough, unspecified: Secondary | ICD-10-CM

## 2014-06-25 DIAGNOSIS — R05 Cough: Secondary | ICD-10-CM

## 2014-06-25 MED ORDER — ALBUTEROL SULFATE HFA 108 (90 BASE) MCG/ACT IN AERS: 2.0000 | INHALATION_SPRAY | Freq: Four times a day (QID) | RESPIRATORY_TRACT | Status: DC | PRN

## 2014-06-25 MED ORDER — AZITHROMYCIN 250 MG PO TABS: ORAL_TABLET | ORAL | Status: DC

## 2014-06-25 MED ORDER — HYDROCODONE-HOMATROPINE 5-1.5 MG/5ML PO SYRP: 5.0000 mL | ORAL_SOLUTION | Freq: Three times a day (TID) | ORAL | Status: DC | PRN

## 2014-06-25 NOTE — Progress Notes (Signed)
Patient ID: Christina Sparks, female   DOB: 01/19/1971, 44 y.o.   MRN: [id number]  HPI:  Pt presents for a same day appointment to discuss cough.  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 About 2-3 weeks ago she began having significant cough. She has been trying to use Mucinex and NyQuil, as well as cough drops, all without relief. She's not had any fevers. Has mild decreased appetite but generally normal intake of fluids. She began having congestion in her nose today, but did not have any before now. Occasionally hears herself wheezing.  ROS: See HPI  PMFSH:  GERD, right knee osteoarthritis, bilateral planter fasciitis, anxiety, depression, anemia  PHYSICAL EXAM: BP 119/77 mmHg  Pulse 86  Temp(Src) 98.1 F (36.7 C) (Oral)  Ht 5\' 7"  (1.702 m)  Wt 267 lb (121.11 kg)  BMI 41.81 kg/m2  SpO2 96%  LMP 05/22/2014 Gen:  No acute distress, pleasant, cooperative HEENT:  Normocephalic, atraumatic, oropharynx clear and moist, TMs clear bilaterally, no anterior cervical lymphadenopathy, nares patent Heart:  Regular rate and rhythm, no murmur Lungs:  Clear throughout without wheezes or rhonchi, no focal crackles, normal respiratory effort with frequent cough Abdomen:  Soft, nontender to palpation Neuro:  Grossly nonfocal, speech normal  ASSESSMENT/PLAN:  1.  Cough: Suspect protracted bronchitis versus atypical pneumonia. No focal findings on pulmonary exam to suggest lobar pneumonia.  Patient has clearly put forth effort to treat this without prescription medications, but symptoms have persisted for 3 weeks now. - azithromycin five-day course  - albuterol inhaler as needed -Hycodan for cough - follow-up if not improving next week  FOLLOW UP: F/u as needed if symptoms worsen or do not improve.   GrenadaBrittany J. Pollie MeyerMcIntyre, MD [facility]

## 2014-06-25 NOTE — Patient Instructions (Signed)
Nice to meet you!  Azithromycin Albuterol inhaler as needed Hycodan cough syrup as needed  Call if not better by Tues/Wed of next weekand I'll order a chest xray  Be well, Dr. Pollie MeyerMcIntyre    Acute Bronchitis Bronchitis is inflammation of the airways that extend from the windpipe into the lungs (bronchi). The inflammation often causes mucus to develop. This leads to a cough, which is the most common symptom of bronchitis.  In acute bronchitis, the condition usually develops suddenly and goes away over time, usually in a couple weeks. Smoking, allergies, and asthma can make bronchitis worse. Repeated episodes of bronchitis may cause further lung problems.  CAUSES Acute bronchitis is most often caused by the same virus that causes a cold. The virus can spread from person to person (contagious) through coughing, sneezing, and touching contaminated objects. SIGNS AND SYMPTOMS   Cough.   Fever.   Coughing up mucus.   Body aches.   Chest congestion.   Chills.   Shortness of breath.   Sore throat.  DIAGNOSIS  Acute bronchitis is usually diagnosed through a physical exam. Your health care provider will also ask you questions about your medical history. Tests, such as chest X-rays, are sometimes done to rule out other conditions.  TREATMENT  Acute bronchitis usually goes away in a couple weeks. Oftentimes, no medical treatment is necessary. Medicines are sometimes given for relief of fever or cough. Antibiotic medicines are usually not needed but may be prescribed in certain situations. In some cases, an inhaler may be recommended to help reduce shortness of breath and control the cough. A cool mist vaporizer may also be used to help thin bronchial secretions and make it easier to clear the chest.  HOME CARE INSTRUCTIONS  Get plenty of rest.   Drink enough fluids to keep your urine clear or pale yellow (unless you have a medical condition that requires fluid restriction).  Increasing fluids may help thin your respiratory secretions (sputum) and reduce chest congestion, and it will prevent dehydration.   Take medicines only as directed by your health care provider.  If you were prescribed an antibiotic medicine, finish it all even if you start to feel better.  Avoid smoking and secondhand smoke. Exposure to cigarette smoke or irritating chemicals will make bronchitis worse. If you are a smoker, consider using nicotine gum or skin patches to help control withdrawal symptoms. Quitting smoking will help your lungs heal faster.   Reduce the chances of another bout of acute bronchitis by washing your hands frequently, avoiding people with cold symptoms, and trying not to touch your hands to your mouth, nose, or eyes.   Keep all follow-up visits as directed by your health care provider.  SEEK MEDICAL CARE IF: Your symptoms do not improve after 1 week of treatment.  SEEK IMMEDIATE MEDICAL CARE IF:  You develop an increased fever or chills.   You have chest pain.   You have severe shortness of breath.  You have bloody sputum.   You develop dehydration.  You faint or repeatedly feel like you are going to pass out.  You develop repeated vomiting.  You develop a severe headache. MAKE SURE YOU:   Understand these instructions.  Will watch your condition.  Will get help right away if you are not doing well or get worse. Document Released: 04/19/2004 Document Revised: 07/27/2013 Document Reviewed: 09/02/2012 [facility] Patient Information 2015 [employer] [place]. This information is not intended to replace advice given to you by your health care  provider. Make sure you discuss any questions you have with your health care provider.

## 2014-06-28 NOTE — Progress Notes (Signed)
I was available as preceptor to resident for this patient's office visit.  

## 2014-06-29 ENCOUNTER — Telehealth: Payer: Self-pay | Admitting: Family Medicine

## 2014-06-29 DIAGNOSIS — R05 Cough: Secondary | ICD-10-CM

## 2014-06-29 DIAGNOSIS — R059 Cough, unspecified: Secondary | ICD-10-CM

## 2014-06-29 NOTE — Telephone Encounter (Signed)
Will forward to Dr McIntyre 

## 2014-06-29 NOTE — Telephone Encounter (Signed)
Was told by Dr Pollie MeyerMcIntyre at last visit to call if cough had not gotten better in a week And she would order a chest x-ray  Is now hurting in the middle part of back

## 2014-06-29 NOTE — Telephone Encounter (Signed)
CXR ordered. Please call patient and let her know where to go to get the xray.  Thanks! Latrelle DodrillBrittany J Harinder Romas, MD

## 2014-06-29 NOTE — Telephone Encounter (Signed)
Pt informed. Shah Insley, CMA  

## 2014-06-30 ENCOUNTER — Ambulatory Visit (HOSPITAL_COMMUNITY)
Admission: RE | Admit: 2014-06-30 | Discharge: 2014-06-30 | Disposition: A | Discharge status: Home / Self Care | Payer: Medicaid Other | Source: Ambulatory Visit | Attending: Family Medicine | Admitting: Family Medicine

## 2014-06-30 ENCOUNTER — Telehealth: Payer: Self-pay | Admitting: Family Medicine

## 2014-06-30 DIAGNOSIS — R05 Cough: Secondary | ICD-10-CM | POA: Diagnosis present

## 2014-06-30 DIAGNOSIS — R059 Cough, unspecified: Secondary | ICD-10-CM

## 2014-06-30 DIAGNOSIS — R0989 Other specified symptoms and signs involving the circulatory and respiratory systems: Secondary | ICD-10-CM | POA: Insufficient documentation

## 2014-06-30 NOTE — Telephone Encounter (Signed)
Called pt and informed of normal CXR, no evidence of pneumonia. She is still having cough but has been a little bit better since taking the azithromycin. She says hycodan gives good relief and helps her sleep. Discussed probable viral etiology, can continue OTC cough/cold and hycodan, and if still not improving at all RTC in 1-2 weeks (but did warn her sometimes coughs can take >6 weeks to go away completely). -Dr. Waynetta SandyWight

## 2014-06-30 NOTE — Telephone Encounter (Signed)
Will forward to PCP for review. Taetum Flewellen, CMA. 

## 2014-06-30 NOTE — Telephone Encounter (Signed)
Had xray done today, and would like know the results as soon as they are available / Dorothey BasemanSadie Reynolds, ASA

## 2014-07-05 ENCOUNTER — Ambulatory Visit: Payer: Medicaid Other | Admitting: Family Medicine

## 2014-07-06 ENCOUNTER — Encounter: Payer: Self-pay | Admitting: Family Medicine

## 2014-07-06 ENCOUNTER — Ambulatory Visit (INDEPENDENT_AMBULATORY_CARE_PROVIDER_SITE_OTHER): Payer: Medicaid Other | Admitting: Family Medicine

## 2014-07-06 ENCOUNTER — Other Ambulatory Visit (HOSPITAL_COMMUNITY)
Admission: RE | Admit: 2014-07-06 | Discharge: 2014-07-06 | Disposition: A | Discharge status: Home / Self Care | Payer: Medicaid Other | Source: Ambulatory Visit | Attending: Family Medicine | Admitting: Family Medicine

## 2014-07-06 VITALS — BP 129/79 | HR 99 | Temp 98.7°F | Wt 270.0 lb

## 2014-07-06 DIAGNOSIS — R52 Pain, unspecified: Secondary | ICD-10-CM

## 2014-07-06 DIAGNOSIS — Z113 Encounter for screening for infections with a predominantly sexual mode of transmission: Secondary | ICD-10-CM | POA: Insufficient documentation

## 2014-07-06 DIAGNOSIS — R1031 Right lower quadrant pain: Secondary | ICD-10-CM | POA: Diagnosis present

## 2014-07-06 LAB — POCT URINALYSIS DIPSTICK
Bilirubin, UA: NEGATIVE
Blood, UA: NEGATIVE
Glucose, UA: NEGATIVE
Ketones, UA: NEGATIVE
Leukocytes, UA: NEGATIVE
Nitrite, UA: NEGATIVE
Protein, UA: NEGATIVE
Spec Grav, UA: >=1.03
Urobilinogen, UA: 0.2
pH, UA: 6

## 2014-07-06 LAB — CBC
HCT: 30 % — ABNORMAL LOW (ref 36.0–46.0)
Hemoglobin: 8.8 g/dL — ABNORMAL LOW (ref 12.0–15.0)
MCH: 17.8 pg — ABNORMAL LOW (ref 26.0–34.0)
MCHC: 29.3 g/dL — ABNORMAL LOW (ref 30.0–36.0)
MCV: 60.9 fL — ABNORMAL LOW (ref 78.0–100.0)
MPV: 8.7 fL (ref 8.6–12.4)
Platelets: 428 10*3/uL — ABNORMAL HIGH (ref 150–400)
RBC: 4.93 MIL/uL (ref 3.87–5.11)
RDW: 18.7 % — ABNORMAL HIGH (ref 11.5–15.5)
WBC: 9.1 10*3/uL (ref 4.0–10.5)

## 2014-07-06 LAB — POCT WET PREP (WET MOUNT): Clue Cells Wet Prep Whiff POC: NEGATIVE

## 2014-07-06 LAB — POCT URINE PREGNANCY: Preg Test, Ur: NEGATIVE

## 2014-07-06 NOTE — Progress Notes (Signed)
I was preceptor the day of this visit.   

## 2014-07-06 NOTE — Patient Instructions (Addendum)
Nice to see you. Your urine does not look infected. Your wet prep did not reveal bacterial vaginosis, yeast, or trichomonas. We will get a CBC to look for signs of infection. You can try tylenol for the pain and heat to the area.  Likely this is related to a muscular strain. If your discomfort gets worse, you develop vomiting or diarrhea, fevers, weakness, numbness, or loss of bowel or bladder function please seek medical assistance.

## 2014-07-06 NOTE — Progress Notes (Signed)
Patient ID: Christina Sparks, female   DOB: [birth year], 44 y.o.   MRN: [id number]  Christina AlarEric Ramandeep Arington, MD Phone: [phone]  Christina Sparks is a 44 y.o. female who presents today for same day appointment.  Presents for right sided pain. Started on Saturday with throbbing discomfort. Hurting more with movement. Was in right flank and also RLQ. Also had associated stomach soreness. No nausea or vomiting. No diarrhea. No fevers. Notes had hard balls of stool last week, though normal BMs since then. Denies dysuria, hematuria, and urgency. Increased frequency with increased water intake. Has not been sexually active in past 3 months. Did not use condoms at that time. LMP 06/26/14. Has periods monthly lasting 4-5 days at a time. No history of kidney stones. Notes had tubal ligation. Notes discomfort has improved since Saturday and is slightly there now. No weakness, numbness, loss of bowel or bladder function, fever, or history of cancer.  PMH: nonsmoker.   ROS: Per HPI   Physical Exam Filed Vitals:   07/06/14 1010  BP: 129/79  Pulse: 99  Temp: 98.7 F (37.1 C)    Gen: Well NAD HEENT: PERRL,  MMM Lungs: CTABL Nl WOB Heart: RRR  Abd: soft, mild tenderness at umbilicus, no guarding or rebound, ND MSK: right lumbar paraspinous and low back tenderness, no midline tenderness, no masses palpated Neuro: 5/5 strength in bilateral quads, hamstrings, plantar and dorsiflexion, sensation to light touch intact in bilateral LE, normal gait, 2+ patellar reflexes GU: normal labia, normal vaginal mucosa, minimal mucus present, no blood, unable to visualize cervical os due to redundant tissue, no cervical motion tenderness, no adnexal masses palpated, no tenderness on bimanual exam Exts: Non edematous BL  LE, warm and well perfused.    Assessment/Plan: Please see individual problem list.  Christina AlarEric Addison Whidbee, MD [facility] PGY[facility]3

## 2014-07-06 NOTE — Assessment & Plan Note (Addendum)
Patient with discomfort in right side (low back and RLQ). Unlikely to be related to UTI with negative UA. UA negative for blood making kidney stone unlikely. Unlikely to be related to yeast, BV, and trich with negative wet prep. Negative pregnancy test makes ectopic unlikely. Normal BMs recently make SBO or obstruction unlikely. Unlikely appendicitis with well appearing patient, lack of fever, lack of RLQ tenderness, and lack of guarding and rebound. Normal bimanual and location of discomfort on exam today make ovarian pathology unlikely. Could be related to constipation with history of constipation last week. Possibly MSK in origin given TTP in right low back. Is neurologically intact with no red flags. Will trial tylenol and heat to the area. Check CBC to evaluate for infectious process. F/u GC/Chlamydia. Given return precautions. F/u if not improving.

## 2014-07-07 ENCOUNTER — Telehealth: Payer: Self-pay | Admitting: Family Medicine

## 2014-07-07 LAB — CERVICOVAGINAL ANCILLARY ONLY
Chlamydia: NEGATIVE
Neisseria Gonorrhea: NEGATIVE

## 2014-07-07 NOTE — Telephone Encounter (Signed)
Called and informed patient of lab results. Negative GC/Chlamydia. Normal WBC. Discomfort likely related to muscle strain. Also informed of low hgb, though is higher than it was at last check. Patient has known iron deficiency anemia and states she stopped taking her iron supplement. I advised that she should start taking the iron supplement again. Advised that she should f/u with her PCP for this and if she developed chest pain, shortness of breath, or light headedness, or fatigue she should follow-up as soon as possible. She voiced understanding.

## 2014-07-13 ENCOUNTER — Encounter: Payer: Self-pay | Admitting: Family Medicine

## 2014-07-13 ENCOUNTER — Ambulatory Visit (INDEPENDENT_AMBULATORY_CARE_PROVIDER_SITE_OTHER): Payer: Medicaid Other | Admitting: Family Medicine

## 2014-07-13 VITALS — BP 111/81 | Temp 98.3°F | Ht 67.0 in | Wt 273.0 lb

## 2014-07-13 DIAGNOSIS — J208 Acute bronchitis due to other specified organisms: Secondary | ICD-10-CM | POA: Diagnosis present

## 2014-07-13 DIAGNOSIS — J209 Acute bronchitis, unspecified: Secondary | ICD-10-CM | POA: Insufficient documentation

## 2014-07-13 MED ORDER — HYDROCODONE-HOMATROPINE 5-1.5 MG/5ML PO SYRP: 5.0000 mL | ORAL_SOLUTION | Freq: Three times a day (TID) | ORAL | Status: DC | PRN

## 2014-07-13 MED ORDER — DOXYCYCLINE HYCLATE 100 MG PO TABS: 100.0000 mg | ORAL_TABLET | Freq: Two times a day (BID) | ORAL | Status: AC

## 2014-07-13 NOTE — Assessment & Plan Note (Signed)
Failed azithromycin. DDx still viral vs bacterial bronchitis; pt has no history of allergies. Elect to treat with second antibiotic, doxycycline x 7 days, counseled pt if not improving after this likely viral and will need to continue symptomatic treatment until resolved and may take up to 8 weeks. Return precautions for SOB, persistent fevers. F/u as needed.

## 2014-07-13 NOTE — Progress Notes (Signed)
   Subjective:    Patient ID: Christina Sparks, female    DOB: 1970-04-28, 44 y.o.   MRN: [id number]  HPI  Patient presents for Same Day Appointment  CC: cough  # Cough:  Started about 3 weeks ago, seen in clinic and given an antibiotic  Cough continues, now somewhat productive and feels like there is "gunk" in her throat  No sore throat  Tried: mucinex, robitussin, nyquil, azithromycin (finished about 2 weeks ago) ROS: no fevers, chills , no n/v/d. No CP, no SOB, no wheezing  Review of Systems   See HPI for ROS. All other systems reviewed and are negative.  Past medical history, surgical, family, and social history reviewed and updated in the EMR as appropriate.  Objective:  BP 111/81 mmHg  Temp(Src) 98.3 F (36.8 C) (Oral)  Ht 5\' 7"  (1.702 m)  Wt 273 lb (123.832 kg)  BMI 42.75 kg/m2  LMP 06/26/2014 Vitals and nursing note reviewed  General: NAD Eyes: sclera anicteric, no injection CV: RRR, nl s1s2 no mrg. Resp: slightly coarse bilaterally, good air movement, no crackles, normal effort Ext: no LE edema Skin: no rashes  Assessment & Plan:  See Problem List Documentation

## 2014-07-14 NOTE — Progress Notes (Signed)
I was preceptor the day of this visit.   

## 2014-07-29 ENCOUNTER — Ambulatory Visit (INDEPENDENT_AMBULATORY_CARE_PROVIDER_SITE_OTHER): Payer: Medicaid Other | Admitting: Family Medicine

## 2014-07-29 ENCOUNTER — Encounter: Payer: Self-pay | Admitting: Family Medicine

## 2014-07-29 VITALS — BP 154/79 | HR 97 | Temp 98.2°F | Wt 272.0 lb

## 2014-07-29 DIAGNOSIS — R059 Cough, unspecified: Secondary | ICD-10-CM

## 2014-07-29 DIAGNOSIS — R05 Cough: Secondary | ICD-10-CM

## 2014-07-29 MED ORDER — CETIRIZINE HCL 10 MG PO TABS: 10.0000 mg | ORAL_TABLET | Freq: Every day | ORAL | Status: DC

## 2014-07-29 MED ORDER — FLUTICASONE PROPIONATE 50 MCG/ACT NA SUSP: 2.0000 | Freq: Every day | NASAL | Status: DC

## 2014-07-29 NOTE — Patient Instructions (Signed)
Start zyrtec daily, also flonase Follow up with Dr. Waynetta SandyWight in 3 weeks to see how it's doing If not better at that time could consider referral to ENT (ear nose throat doctor)  Be well, Dr. Pollie MeyerMcIntyre

## 2014-07-29 NOTE — Progress Notes (Signed)
Patient ID: Christina Sparks, female   DOB: April 24, 1970, 44 y.o.   MRN: [id number]  HPI:  Pt presents for a same day appointment to discuss cough.  She was seen here by me on April 1 for cough, and then again on April 19. She's completed a course of azithromycin and a course of doxycycline. She continues to have persistent cough without improvement. Has been using albuterol inhaler which has not helped. Rarely produces mucus. Denies fever, but endorses occasional chills. Does not really think she has much nasal congestion. Thinks most of the cough originates from her throat area. She had one episode of posttussive emesis yesterday, however otherwise has not vomited. Not allergic to any medications.   ROS: See HPI  PMFSH: Abstinent for 4-5 months and also has history of tubal ligation. History of migraines, obesity, acid reflux, anxiety/depression  PHYSICAL EXAM: BP 154/79 mmHg  Pulse 97  Temp(Src) 98.2 F (36.8 C) (Oral)  Wt 272 lb (123.378 kg)  SpO2 100%  LMP 06/26/2014 Gen: No acute distress, pleasant, cooperative HEENT: Normocephalic, atraumatic, oropharynx clear and moist, no anterior cervical lymphadenopathy, TMs clear bilaterally, nares patent Heart: Regular rate and rhythm, no murmur Lungs: Clear to auscultation bilaterally, normal respiratory effort, frequent cough Neuro: Grossly nonfocal, speech normal  ASSESSMENT/PLAN:  Cough Given lack of response to 2 separate courses of antibiotics and lack of fever, strongly doubt bacterial etiology at this time. She has had a chest x-ray which was normal. She is already on therapy for acid reflux with Protonix 40 mg daily. At this time I favor allergic etiology. Will start with Zyrtec 10 mg daily and Flonase. Follow-up with PCP in 3 weeks. If not improved at that time, would consider referral to ENT to evaluate other possible etiologies of cough.   FOLLOW UP: F/u in 3 weeks with PCP for cough.  GrenadaBrittany J. Pollie MeyerMcIntyre, MD [facility]

## 2014-08-02 DIAGNOSIS — R05 Cough: Secondary | ICD-10-CM | POA: Insufficient documentation

## 2014-08-02 DIAGNOSIS — R053 Chronic cough: Secondary | ICD-10-CM | POA: Insufficient documentation

## 2014-08-02 DIAGNOSIS — R059 Cough, unspecified: Secondary | ICD-10-CM | POA: Insufficient documentation

## 2014-08-02 NOTE — Assessment & Plan Note (Addendum)
Given lack of response to 2 separate courses of antibiotics and lack of fever, strongly doubt bacterial etiology at this time. She has had a chest x-ray which was normal. She is already on therapy for acid reflux with Protonix 40 mg daily. At this time I favor allergic etiology. Will start with Zyrtec 10 mg daily and Flonase. Follow-up with PCP in 3 weeks. If not improved at that time, would consider referral to ENT to evaluate other possible etiologies of cough.

## 2014-08-11 NOTE — Progress Notes (Signed)
I was preceptor for this visit.  Carmelia Tiner, MD 

## 2014-08-13 ENCOUNTER — Telehealth: Payer: Self-pay | Admitting: Family Medicine

## 2014-08-13 NOTE — Telephone Encounter (Signed)
Need referral to [facility] for plantar fasciitis

## 2014-08-13 NOTE — Telephone Encounter (Signed)
Spoke with patient and informed her that per referral note she can call their office to reschedule her appt.  She voiced understanding and phone number given to her to call. Keino Placencia,CMA

## 2014-08-16 ENCOUNTER — Telehealth: Payer: Self-pay | Admitting: Family Medicine

## 2014-08-16 DIAGNOSIS — M1711 Unilateral primary osteoarthritis, right knee: Secondary | ICD-10-CM

## 2014-08-16 NOTE — Telephone Encounter (Signed)
Pt is requesting a referral to go back to her knee specialist, says she has already seen them in the past

## 2014-08-16 NOTE — Telephone Encounter (Signed)
Will forward to MD. Jaquelin Meaney,CMA  

## 2014-08-20 ENCOUNTER — Telehealth: Payer: Self-pay | Admitting: Family Medicine

## 2014-08-20 NOTE — Telephone Encounter (Signed)
LMOVM for patient to return call. Please advise patient that she has an appt at [facility] on [facility] @ 2pm With Dr. Farris HasKramer.  Address: [address], [place], [place] [address] [phone]

## 2014-08-30 ENCOUNTER — Ambulatory Visit: Payer: Medicaid Other | Admitting: Podiatry

## 2014-08-31 ENCOUNTER — Ambulatory Visit (INDEPENDENT_AMBULATORY_CARE_PROVIDER_SITE_OTHER): Payer: Medicaid Other | Admitting: Podiatry

## 2014-08-31 VITALS — BP 135/72 | HR 83 | Resp 16

## 2014-08-31 DIAGNOSIS — M722 Plantar fascial fibromatosis: Secondary | ICD-10-CM

## 2014-08-31 MED ORDER — TRIAMCINOLONE ACETONIDE 10 MG/ML IJ SUSP
10.0000 mg | Freq: Once | INTRAMUSCULAR | Status: AC
Start: 2014-08-31 — End: 2014-08-31
  Administered 2014-08-31: 10 mg

## 2014-08-31 NOTE — Progress Notes (Signed)
Subjective:     Patient ID: Christina Sparks, female   DOB: June 05, 1970, 44 y.o.   MRN: [id number]  HPI patient presents stating my heels have started to hurt me again and especially when I get up in the morning or after sitting   Review of Systems     Objective:   Physical Exam Neurovascular status intact with discomfort in the plantar heel region bilateral with inflammation and fluid at the insertion into the calcaneus    Assessment:     Plantar fasciitis bilateral with inflammation and fluid buildup    Plan:     Reinjected the plantar fascia bilateral 3 Milligan Kenalog 5 mg Xylocaine and applied fascial boot with instructions on usage

## 2014-09-08 ENCOUNTER — Encounter: Payer: Self-pay | Admitting: Family Medicine

## 2014-09-08 ENCOUNTER — Ambulatory Visit (INDEPENDENT_AMBULATORY_CARE_PROVIDER_SITE_OTHER): Payer: Medicaid Other | Admitting: Family Medicine

## 2014-09-08 VITALS — BP 134/79 | HR 95 | Temp 98.1°F | Wt 268.0 lb

## 2014-09-08 DIAGNOSIS — M62838 Other muscle spasm: Secondary | ICD-10-CM

## 2014-09-08 DIAGNOSIS — D649 Anemia, unspecified: Secondary | ICD-10-CM | POA: Diagnosis not present

## 2014-09-08 DIAGNOSIS — R202 Paresthesia of skin: Secondary | ICD-10-CM

## 2014-09-08 NOTE — Patient Instructions (Signed)
Nice to see you.  Please schedule an appointment for labs. We will order an MRI to evaluate your neck.  If you develop chest pain, shortness of breath, light headedness, numbness, weakness, loss of bowel or bladder function please seek medical attention.

## 2014-09-09 ENCOUNTER — Other Ambulatory Visit: Payer: Medicaid Other

## 2014-09-09 DIAGNOSIS — R202 Paresthesia of skin: Secondary | ICD-10-CM | POA: Insufficient documentation

## 2014-09-09 NOTE — Assessment & Plan Note (Signed)
Likely dehydration vs electrolyte abnormality. Will check BMET. Patient noted she had to leave to get to another appointment for an MRI of her knee and would come back to have labs drawn at a future time. Advised to increase water intake to 8 glasses a day.

## 2014-09-09 NOTE — Progress Notes (Signed)
Patient ID: Christina Sparks, female   DOB: 04-27-1970, 44 y.o.   MRN: [id number]  Christina Alar, MD Phone: [phone]  Christina Sparks is a 44 y.o. female who presents today for same day appointment.   Cramping in extremities: patient notes she's had cramping in hands and legs since getting steroid injection in her knees several weeks ago. This is intermittent. Occurs most nights. Only drinks 2 glasses of water a day, and not much more liquid. Notes no history of abnormal electrolytes. Does note some tingling in the ulnar aspect of her left hand that is intermittent and if she hold her arm in certain positions. Notes spasm of left trap with this, though no neck pain. States she can reproduce the tingling by palpating the left trap. No weakness or numbness. No bowel or bladder dysfunction. No fever. No history of cancer.   Notes she continues to feel fatigued. Eating lots of ice. No blood in stool. Still notes heavy periods, though not on period at this time. Periods last 4-5 days at a time and goes through 5-6 super pads each day. Denies chest pain and dyspnea. No rectal bleeding. No smoking history. No night sweats. No weight loss. No fevers. Did not get pelvic US completed and appears to have had stool cards ordered which were not done either. States very infrequently takes her iron supplement.  PMH: nonsmoker.   ROS: Per HPI   Physical Exam Filed Vitals:   09/08/14 1138  BP: 134/79  Pulse: 95  Temp: 98.1 F (36.7 C)    Gen: Well NAD HEENT: PERRL,  MMM Lungs: CTABL Nl WOB Heart: RRR, no murmur appreciated  Abd: soft, NT, ND No cervical, supraclavicular, or axillary LAD. MSK: no midline spine tenderness, there is spasm of the left trapezius, there are no bony abnormalities or tenderness of the bilateral wrists, no swelling or erythema bilateral wrists, negative tinels bilaterally, full ROM neck, negative spurlings Neuro: 5/5 strength in bilateral biceps, triceps, grip, quads,  hamstrings, plantar and dorsiflexion, sensation to light touch intact in bilateral UE and LE, normal gait, 2+ patellar reflexes Exts: Non edematous BL  LE, warm and well perfused.    Assessment/Plan: Please see individual problem list.  Christina Alar, MD [facility] PGY-3

## 2014-09-09 NOTE — Assessment & Plan Note (Addendum)
Likely the cause of her fatigue. Like related to iron deficiency anemia from menorrhagia. Vitals are stable today. Will reorder transvaginal and pelvic US as she did not get these done previously. Advised patient to take her iron supplement daily. Also gave patient stool cards as she did not complete these previously. Will check TSH for other causes of fatigue. No indications on history that fatigue could come from malignant process. Of note patient had to leave for a ntoher appointment and will come back for lab appointment for lab work.

## 2014-09-09 NOTE — Assessment & Plan Note (Addendum)
Patient with paresthesia in left hand ulnar distribution. Has some trap spasm, though no overt neck pain. Possible causes could be cervical impingment, ulnar entrapment at elbow or wrist. She is neurologically intact at this time. No red flags. Discussed options for work up including nerve conduction studies, XR, and MRI imaging. Patient opted for MRI of her neck to give a definitive answer for this. Will order this. Patient declines having metal in her body or a pacemaker.Given return precautions.

## 2014-09-10 ENCOUNTER — Other Ambulatory Visit: Payer: Medicaid Other

## 2014-09-10 DIAGNOSIS — M62838 Other muscle spasm: Secondary | ICD-10-CM

## 2014-09-10 DIAGNOSIS — D649 Anemia, unspecified: Secondary | ICD-10-CM

## 2014-09-10 LAB — BASIC METABOLIC PANEL
BUN: 10 mg/dL (ref 6–23)
CO2: 24 mEq/L (ref 19–32)
Calcium: 9 mg/dL (ref 8.4–10.5)
Chloride: 102 mEq/L (ref 96–112)
Creat: 0.67 mg/dL (ref 0.50–1.10)
Glucose, Bld: 85 mg/dL (ref 70–99)
Potassium: 4.1 mEq/L (ref 3.5–5.3)
Sodium: 139 mEq/L (ref 135–145)

## 2014-09-10 LAB — CBC
HCT: 31.1 % — ABNORMAL LOW (ref 36.0–46.0)
Hemoglobin: 9.3 g/dL — ABNORMAL LOW (ref 12.0–15.0)
MCH: 18.4 pg — ABNORMAL LOW (ref 26.0–34.0)
MCHC: 29.9 g/dL — ABNORMAL LOW (ref 30.0–36.0)
MCV: 61.5 fL — ABNORMAL LOW (ref 78.0–100.0)
MPV: 9 fL (ref 8.6–12.4)
Platelets: 402 10*3/uL — ABNORMAL HIGH (ref 150–400)
RBC: 5.06 MIL/uL (ref 3.87–5.11)
RDW: 18.5 % — ABNORMAL HIGH (ref 11.5–15.5)
WBC: 10 10*3/uL (ref 4.0–10.5)

## 2014-09-10 LAB — TSH: TSH: 1.712 u[IU]/mL (ref 0.350–4.500)

## 2014-09-10 NOTE — Progress Notes (Signed)
Bmp,cbc and tsh done today [facility] Noe

## 2014-09-14 ENCOUNTER — Telehealth: Payer: Self-pay | Admitting: Family Medicine

## 2014-09-14 ENCOUNTER — Other Ambulatory Visit: Payer: Medicaid Other

## 2014-09-14 NOTE — Telephone Encounter (Signed)
Called patient and informed of blood work results. Advised of Hgb of 9.3. Advised that she needs to take the iron supplement daily. She has been scheduled for US pelvis and has stool cards that I reminded her to complete. Discussed return precautions as well.

## 2014-09-20 ENCOUNTER — Ambulatory Visit (HOSPITAL_COMMUNITY): Admission: RE | Admit: 2014-09-20 | Payer: Medicaid Other | Source: Ambulatory Visit

## 2014-09-21 ENCOUNTER — Ambulatory Visit (HOSPITAL_COMMUNITY)
Admission: RE | Admit: 2014-09-21 | Discharge: 2014-09-21 | Disposition: A | Discharge status: Home / Self Care | Payer: Medicaid Other | Source: Ambulatory Visit | Attending: Family Medicine | Admitting: Family Medicine

## 2014-09-21 ENCOUNTER — Telehealth: Payer: Self-pay | Admitting: Family Medicine

## 2014-09-21 ENCOUNTER — Other Ambulatory Visit: Payer: Self-pay | Admitting: Family Medicine

## 2014-09-21 DIAGNOSIS — N920 Excessive and frequent menstruation with regular cycle: Secondary | ICD-10-CM | POA: Insufficient documentation

## 2014-09-21 DIAGNOSIS — R208 Other disturbances of skin sensation: Secondary | ICD-10-CM | POA: Insufficient documentation

## 2014-09-21 DIAGNOSIS — M4802 Spinal stenosis, cervical region: Secondary | ICD-10-CM

## 2014-09-21 DIAGNOSIS — D649 Anemia, unspecified: Secondary | ICD-10-CM | POA: Diagnosis not present

## 2014-09-21 MED ORDER — METAXALONE 800 MG PO TABS: 800.0000 mg | ORAL_TABLET | Freq: Three times a day (TID) | ORAL | Status: DC | PRN

## 2014-09-21 MED ORDER — CYCLOBENZAPRINE HCL 5 MG PO TABS
5.0000 mg | ORAL_TABLET | Freq: Three times a day (TID) | ORAL | Status: DC | PRN
Start: 2014-09-21 — End: 2015-01-27

## 2014-09-21 MED ORDER — FERROUS SULFATE 75 (15 FE) MG/ML PO SOLN: 15.0000 mg | Freq: Every day | ORAL | Status: DC

## 2014-09-21 NOTE — Telephone Encounter (Signed)
Called patient and informed of MRI and US results. Discussed no abnormalities seen on US. Given heavy periods and low Hgb with normal US will refer to GYN for further evaluation with consideration of sonohystogram. Advised of DDD in neck and spinal stenosis. Given patient symptoms will refer to neurosurgery for evaluation of this issue. Patient reports continued muscle spasms that have begun to bother her more. Her most recent BMET and TSH were normal. As discussed at her last visit we will give a trial of flexeril for this. I advised her to follow-up in the next 1-2 weeks. Given return precautions.

## 2014-09-21 NOTE — Telephone Encounter (Signed)
Would like a muscle relaxer called in Also she has lost her bottle for her iron.   Would like to have a refill on her iron pill [facility] [address]

## 2014-09-22 ENCOUNTER — Telehealth: Payer: Self-pay | Admitting: *Deleted

## 2014-09-22 NOTE — Telephone Encounter (Signed)
Prior Authorization received from [facility] Formulary and PA form placed in provider box for completion. Clovis PuMartin, Tamecca Artiga L, RN

## 2014-09-29 ENCOUNTER — Other Ambulatory Visit: Payer: Self-pay | Admitting: Family Medicine

## 2014-09-29 NOTE — Telephone Encounter (Signed)
Pt will need to do trial of flexeril before PA would be approved. Baclofen could also be tried first.

## 2014-10-20 ENCOUNTER — Encounter: Payer: Medicaid Other | Admitting: Obstetrics & Gynecology

## 2015-01-07 ENCOUNTER — Encounter: Payer: Self-pay | Admitting: Family Medicine

## 2015-01-07 ENCOUNTER — Ambulatory Visit (INDEPENDENT_AMBULATORY_CARE_PROVIDER_SITE_OTHER): Payer: Medicaid Other | Admitting: Family Medicine

## 2015-01-07 VITALS — BP 127/78 | HR 82 | Temp 97.8°F | Ht 67.0 in | Wt 275.3 lb

## 2015-01-07 DIAGNOSIS — Z124 Encounter for screening for malignant neoplasm of cervix: Secondary | ICD-10-CM

## 2015-01-07 DIAGNOSIS — M79671 Pain in right foot: Secondary | ICD-10-CM | POA: Diagnosis not present

## 2015-01-07 DIAGNOSIS — M25571 Pain in right ankle and joints of right foot: Secondary | ICD-10-CM

## 2015-01-07 MED ORDER — GABAPENTIN 100 MG PO CAPS: 100.0000 mg | ORAL_CAPSULE | Freq: Every day | ORAL | Status: DC

## 2015-01-07 NOTE — Progress Notes (Signed)
   Subjective:    Patient ID: Christina Sparks, female    DOB: 1971/03/10, 44 y.o.   MRN: [id number]  HPI  CC: right foot swelling  # Right foot swelling:  Started about 2-3 weeks ago  Painful on the top of the right foot and wrapping around the ankle   Pain even to some light touch, gets a burning type pain that sometimes radiates up her lower calf/shin  Bottom of foot is not really affected but feels it is swollen  Denies any trauma  Initially says no changes recently, however eventually says that she started going to the gym/exercising around 4 weeks ago (before this pain started), and also walking a lot more for work.  Also endorses using more salt in diet; goes to fast food/restaurant a fair amount but has not gone more frequently  Has tried some ibuprofen but not much relief  She is concerned she has a clot in her foot ROS: no numbness/tingling, +warmth at night of the right foot, no fevers/chills  # Pap smear  Requests referral to OBGYN for pap smear  Social Hx: never smoker  Review of Systems   See HPI for ROS.   Past medical history, surgical, family, and social history reviewed and updated in the EMR as appropriate. Objective:  BP 127/78 mmHg  Pulse 82  Temp(Src) 97.8 F (36.6 C) (Oral)  Ht 5\' 7"  (1.702 m)  Wt 275 lb 4.8 oz (124.875 kg)  BMI 43.11 kg/m2  LMP 01/01/2015 Vitals and nursing note reviewed  General: NAD CV: RRR, normal s1s2, no murmurs/rub/gallop. 2+ radial, PT, DP pulses bilaterally Resp: clear to auscultation bilaterally, normal effort Ext: there is pedal edema up to the ankles on both sides, right worse than left. ROM of foot/ankle intact. Right foot TTP diffusely even to light touch, mostly on dorsum midfoot and wrapping around the lateral ankle. There is no warmth or fluctuance.  Assessment & Plan:  Right foot pain Acute pain x 2-3 weeks. Initially denies any changes but then reports more frequent walking, work outs at gym within the  last month. At this point feel it is MSK etiology, not as suspicious for vascular/venous insufficiency or other causes of LE edema. Recommended conservative treatments at this time for elevating legs with light compression with ace bandage, icing, OTC analgesics, can trial gabapentin to see if this improves some of the burning type pain. Reassured patient that this does not appear to be a DVT, given return precautions/signs to look for this including calf swelling. Follow up 2 weeks if not starting to see improvement.

## 2015-01-07 NOTE — Patient Instructions (Signed)
Gabapentin 100mg  at night to help with nerve inflammation  Tylenol 500-650mg  every 6 hours Ibuprofen 600-800mg  every 6 hours as needed  Ice and elevate the foot when not working, use an ace bandage to wrap it as tolerated.

## 2015-01-10 DIAGNOSIS — M79671 Pain in right foot: Secondary | ICD-10-CM | POA: Insufficient documentation

## 2015-01-10 NOTE — Assessment & Plan Note (Addendum)
Acute pain x 2-3 weeks. Initially denies any changes but then reports more frequent walking, work outs at gym within the last month. At this point feel it is MSK etiology, not as suspicious for vascular/venous insufficiency or other causes of LE edema. Recommended conservative treatments at this time for elevating legs with light compression with ace bandage, icing, OTC analgesics, can trial gabapentin to see if this improves some of the burning type pain. Reassured patient that this does not appear to be a DVT, given return precautions/signs to look for this including calf swelling. Patient requests x-rays to eval for fracture, suspect these will be negative but ordered to reassure patient. Follow up 2 weeks if not starting to see improvement.

## 2015-01-17 ENCOUNTER — Ambulatory Visit: Payer: Medicaid Other | Admitting: Podiatry

## 2015-01-24 ENCOUNTER — Ambulatory Visit: Payer: Medicaid Other | Admitting: Family Medicine

## 2015-01-27 ENCOUNTER — Ambulatory Visit: Payer: Medicaid Other | Admitting: Obstetrics

## 2015-01-27 ENCOUNTER — Ambulatory Visit (INDEPENDENT_AMBULATORY_CARE_PROVIDER_SITE_OTHER): Payer: Medicaid Other | Admitting: Obstetrics

## 2015-01-27 ENCOUNTER — Encounter: Payer: Self-pay | Admitting: Obstetrics

## 2015-01-27 VITALS — BP 111/78 | HR 85 | Temp 98.7°F | Ht 67.0 in | Wt 278.0 lb

## 2015-01-27 DIAGNOSIS — N939 Abnormal uterine and vaginal bleeding, unspecified: Secondary | ICD-10-CM

## 2015-01-27 DIAGNOSIS — D500 Iron deficiency anemia secondary to blood loss (chronic): Secondary | ICD-10-CM

## 2015-01-27 DIAGNOSIS — N898 Other specified noninflammatory disorders of vagina: Secondary | ICD-10-CM

## 2015-01-27 DIAGNOSIS — Z0000 Encounter for general adult medical examination without abnormal findings: Secondary | ICD-10-CM | POA: Diagnosis not present

## 2015-01-27 DIAGNOSIS — E669 Obesity, unspecified: Secondary | ICD-10-CM

## 2015-01-27 DIAGNOSIS — Z01419 Encounter for gynecological examination (general) (routine) without abnormal findings: Secondary | ICD-10-CM

## 2015-01-27 DIAGNOSIS — M62830 Muscle spasm of back: Secondary | ICD-10-CM

## 2015-01-27 DIAGNOSIS — N946 Dysmenorrhea, unspecified: Secondary | ICD-10-CM

## 2015-01-27 LAB — CBC
HCT: 30.6 % — ABNORMAL LOW (ref 36.0–46.0)
Hemoglobin: 9.1 g/dL — ABNORMAL LOW (ref 12.0–15.0)
MCH: 18.6 pg — ABNORMAL LOW (ref 26.0–34.0)
MCHC: 29.7 g/dL — ABNORMAL LOW (ref 30.0–36.0)
MCV: 62.4 fL — ABNORMAL LOW (ref 78.0–100.0)
MPV: 9 fL (ref 8.6–12.4)
Platelets: 368 10*3/uL (ref 150–400)
RBC: 4.9 MIL/uL (ref 3.87–5.11)
RDW: 17.4 % — ABNORMAL HIGH (ref 11.5–15.5)
WBC: 9.2 10*3/uL (ref 4.0–10.5)

## 2015-01-27 MED ORDER — PHENTERMINE HCL 37.5 MG PO CAPS: 37.5000 mg | ORAL_CAPSULE | ORAL | Status: DC

## 2015-01-27 MED ORDER — CYCLOBENZAPRINE HCL 10 MG PO TABS: 10.0000 mg | ORAL_TABLET | Freq: Three times a day (TID) | ORAL | Status: DC | PRN

## 2015-01-27 MED ORDER — IBUPROFEN 800 MG PO TABS: 800.0000 mg | ORAL_TABLET | Freq: Three times a day (TID) | ORAL | Status: DC | PRN

## 2015-01-27 NOTE — Progress Notes (Signed)
Subjective:        Christina Sparks is a 44 y.o. female here for a routine exam.  Current complaints: Heavy and painful periods.  Undesirable weight gain.  Personal health questionnaire:  Is patient Ashkenazi Jewish, have a family history of breast and/or ovarian cancer: no Is there a family history of uterine cancer diagnosed at age < 78, gastrointestinal cancer, urinary tract cancer, family member who is a Personnel officer syndrome-associated carrier: no Is the patient overweight and hypertensive, family history of diabetes, personal history of gestational diabetes, preeclampsia or PCOS: no Is patient over 38, have PCOS,  family history of premature CHD under age 62, diabetes, smoke, have hypertension or peripheral artery disease:  no At any time, has a partner hit, kicked or otherwise hurt or frightened you?: no Over the past 2 weeks, have you felt down, depressed or hopeless?: no Over the past 2 weeks, have you felt little interest or pleasure in doing things?:no   Gynecologic History Patient's last menstrual period was 01/01/2015 (exact date). Contraception: tubal ligation Last Pap: 2015. Results were: normal Last mammogram: 2015. Results were: normal  Obstetric History OB History  Gravida Para Term Preterm AB SAB TAB Ectopic Multiple Living  3 3            # Outcome Date GA Lbr Len/2nd Weight Sex Delivery Anes PTL Lv  3 Para           2 Para           1 Para               Past Medical History  Diagnosis Date  . Back pain   . Anemia   . Migraines   . Knee pain   . S/P tubal ligation   . S/P bilateral breast reduction     Past Surgical History  Procedure Laterality Date  . Cesarean section    . Tubal ligation    . Breast surgery      breast reduction     Current outpatient prescriptions:  .  ferrous sulfate (FER-IN-SOL) 75 (15 FE) MG/ML SOLN, Take 1 mL (15 mg of iron total) by mouth daily. With breakfast., Disp: 1 Bottle, Rfl: 3 .  albuterol (PROVENTIL HFA;VENTOLIN  HFA) 108 (90 BASE) MCG/ACT inhaler, Inhale 2 puffs into the lungs every 6 (six) hours as needed for wheezing or shortness of breath. (Patient not taking: Reported on 01/27/2015), Disp: 1 Inhaler, Rfl: 0 .  cetirizine (ZYRTEC) 10 MG tablet, Take 1 tablet (10 mg total) by mouth daily. (Patient not taking: Reported on 01/27/2015), Disp: 30 tablet, Rfl: 3 .  cyclobenzaprine (FLEXERIL) 10 MG tablet, Take 1 tablet (10 mg total) by mouth every 8 (eight) hours as needed for muscle spasms., Disp: 30 tablet, Rfl: 2 .  diclofenac (VOLTAREN) 75 MG EC tablet, Take 1 tablet (75 mg total) by mouth 2 (two) times daily as needed. (Patient not taking: Reported on 01/27/2015), Disp: 60 tablet, Rfl: 0 .  fluticasone (FLONASE) 50 MCG/ACT nasal spray, Place 2 sprays into both nostrils daily. (Patient not taking: Reported on 01/27/2015), Disp: 16 g, Rfl: 3 .  gabapentin (NEURONTIN) 100 MG capsule, Take 1 capsule (100 mg total) by mouth at bedtime. (Patient not taking: Reported on 01/27/2015), Disp: 30 capsule, Rfl: 0 .  ibuprofen (ADVIL,MOTRIN) 800 MG tablet, Take 1 tablet (800 mg total) by mouth every 8 (eight) hours as needed., Disp: 30 tablet, Rfl: prn .  pantoprazole (PROTONIX) 40 MG tablet, Take  1 tablet (40 mg total) by mouth daily. (Patient not taking: Reported on 01/27/2015), Disp: 30 tablet, Rfl: 1 .  phentermine 37.5 MG capsule, Take 1 capsule (37.5 mg total) by mouth every morning. Part of weight loss program., Disp: 30 capsule, Rfl: 2 .  traMADol (ULTRAM) 50 MG tablet, Take 1 tablet (50 mg total) by mouth every 8 (eight) hours as needed. (Patient not taking: Reported on 01/27/2015), Disp: 30 tablet, Rfl: 0 Allergies  Allergen Reactions  . Sulfa Drugs Cross Reactors Nausea And Vomiting and Swelling    Social History  Substance Use Topics  . Smoking status: Never Smoker   . Smokeless tobacco: Not on file  . Alcohol Use: No     Comment: occasional    Family History  Problem Relation Age of Onset  . Hypertension  Mother   . Heart disease Father   . Hyperlipidemia Father       Review of Systems  Constitutional: negative for fatigue and weight loss.  Positive for weight gain Respiratory: negative for cough and wheezing Cardiovascular: negative for chest pain, fatigue and palpitations Gastrointestinal: negative for abdominal pain and change in bowel habits Musculoskeletal: positive for myalgias Neurological: negative for gait problems and tremors Behavioral/Psych: negative for abusive relationship, depression Endocrine: negative for temperature intolerance   Genitourinary: positive for abnormal menstrual periods.  Negative for genital lesions, hot flashes, sexual problems and vaginal discharge Integument/breast: negative for breast lump, breast tenderness, nipple discharge and skin lesion(s)    Objective:       BP 111/78 mmHg  Pulse 85  Temp(Src) 98.7 F (37.1 C) (Oral)  Ht  (1.702 m)  Wt 278 lb (126.1 kg)  BMI 43.53 kg/m2  LMP 01/01/2015 (Exact Date) General:   alert  Skin:   no rash or abnormalities  Lungs:   clear to auscultation bilaterally  Heart:   regular rate and rhythm, S1, S2 normal, no murmur, click, rub or gallop  Breasts:   normal without suspicious masses, skin or nipple changes or axillary nodes  Abdomen:  normal findings: no organomegaly, soft, non-tender and no hernia  Pelvis:  External genitalia: normal general appearance Urinary system: urethral meatus normal and bladder without fullness, nontender Vaginal: normal without tenderness, induration or masses Cervix: normal appearance Adnexa: normal bimanual exam Uterus: anteverted and non-tender, normal size   Lab Review Urine pregnancy test is negative Labs reviewed yes, previous CBC Radiologic studies reviewed yes, previous ultrasound    Assessment:    Healthy female exam.    Obesity  AUB - Menorrhagia - Hormonal Imbalance  Iron Deficiency Anemia  Backache       Plan:   Phentermine Rx for  weight loss with program of diet and exercise Considering recommendation of Endometrial Ablation for AUB Continue Iron and Vitamins for Anemia Flexeril / Ibuprofen Rx for Backache / Muscle Spasms  Education reviewed: calcium supplements, low fat, low cholesterol diet, self breast exams and weight bearing exercise. Follow up in: 1 year.   Meds ordered this encounter  Medications  . phentermine 37.5 MG capsule    Sig: Take 1 capsule (37.5 mg total) by mouth every morning. Part of weight loss program.    Dispense:  30 capsule    Refill:  2  . ibuprofen (ADVIL,MOTRIN) 800 MG tablet    Sig: Take 1 tablet (800 mg total) by mouth every 8 (eight) hours as needed.    Dispense:  30 tablet    Refill:  prn  . cyclobenzaprine (FLEXERIL) 10  MG tablet    Sig: Take 1 tablet (10 mg total) by mouth every 8 (eight) hours as needed for muscle spasms.    Dispense:  30 tablet    Refill:  2   Orders Placed This Encounter  Procedures  . SureSwab, Vaginosis/Vaginitis Plus  . HIV antibody  . Hepatitis B surface antigen  . RPR  . Hepatitis C antibody  . CBC

## 2015-01-28 LAB — HEPATITIS C ANTIBODY: HCV Ab: NEGATIVE

## 2015-01-28 LAB — HIV ANTIBODY (ROUTINE TESTING W REFLEX): HIV 1&2 Ab, 4th Generation: NONREACTIVE

## 2015-01-28 LAB — HEPATITIS B SURFACE ANTIGEN: Hepatitis B Surface Ag: NEGATIVE

## 2015-01-28 LAB — RPR

## 2015-01-31 LAB — PAP, TP IMAGING W/ HPV RNA, RFLX HPV TYPE 16,18/45: HPV mRNA, High Risk: NOT DETECTED

## 2015-02-01 ENCOUNTER — Ambulatory Visit: Payer: Medicaid Other | Admitting: Family Medicine

## 2015-02-02 ENCOUNTER — Telehealth: Payer: Self-pay | Admitting: *Deleted

## 2015-02-02 LAB — SURESWAB, VAGINOSIS/VAGINITIS PLUS
Atopobium vaginae: NOT DETECTED Log (cells/mL)
C. albicans, DNA: DETECTED — AB
C. glabrata, DNA: NOT DETECTED
C. parapsilosis, DNA: NOT DETECTED
C. trachomatis RNA, TMA: NOT DETECTED
C. tropicalis, DNA: NOT DETECTED
Gardnerella vaginalis: NOT DETECTED Log (cells/mL)
LACTOBACILLUS SPECIES: NOT DETECTED Log (cells/mL)
MEGASPHAERA SPECIES: NOT DETECTED Log (cells/mL)
N. gonorrhoeae RNA, TMA: NOT DETECTED
T. vaginalis RNA, QL TMA: NOT DETECTED

## 2015-02-02 NOTE — Telephone Encounter (Signed)
Patient contacted the office requesting lab results. Patient advised not all test results are back. Reviewed the test results that are currently back. Patient verbalized understanding. Patient also states she was supposed to have a  Prescription for Protonix sent to the pharmacy. Patient states the prescription was not sent in. Patient requesting the prescription be sent to the pharmacy.

## 2015-02-03 ENCOUNTER — Other Ambulatory Visit: Payer: Self-pay | Admitting: Family Medicine

## 2015-02-03 ENCOUNTER — Other Ambulatory Visit: Payer: Self-pay | Admitting: Obstetrics

## 2015-02-03 ENCOUNTER — Ambulatory Visit: Payer: Medicaid Other | Admitting: Podiatry

## 2015-02-03 DIAGNOSIS — B373 Candidiasis of vulva and vagina: Secondary | ICD-10-CM

## 2015-02-03 DIAGNOSIS — B3731 Acute candidiasis of vulva and vagina: Secondary | ICD-10-CM

## 2015-02-03 MED ORDER — FLUCONAZOLE 150 MG PO TABS: 150.0000 mg | ORAL_TABLET | Freq: Once | ORAL | Status: DC

## 2015-02-03 NOTE — Telephone Encounter (Signed)
She needs to contact PCP for Protonix Rx.

## 2015-02-04 NOTE — Telephone Encounter (Signed)
Patient advised of recommendation and verbalized understanding.  

## 2015-02-04 NOTE — Telephone Encounter (Signed)
Refill request from pharmacy. Will forward to PCP for review. Nicholas Trompeter, CMA. 

## 2015-03-29 ENCOUNTER — Other Ambulatory Visit: Payer: Self-pay | Admitting: Obstetrics

## 2015-03-29 ENCOUNTER — Telehealth: Payer: Self-pay | Admitting: *Deleted

## 2015-03-29 DIAGNOSIS — N946 Dysmenorrhea, unspecified: Secondary | ICD-10-CM

## 2015-03-29 MED ORDER — IBUPROFEN 800 MG PO TABS: 800.0000 mg | ORAL_TABLET | Freq: Three times a day (TID) | ORAL | Status: DC | PRN

## 2015-03-29 NOTE — Telephone Encounter (Signed)
Patient is requesting a refill of her Ibuprofen 800mg .

## 2015-04-13 ENCOUNTER — Encounter: Payer: Self-pay | Admitting: Podiatry

## 2015-04-13 ENCOUNTER — Ambulatory Visit (INDEPENDENT_AMBULATORY_CARE_PROVIDER_SITE_OTHER): Payer: Medicaid Other

## 2015-04-13 ENCOUNTER — Ambulatory Visit (INDEPENDENT_AMBULATORY_CARE_PROVIDER_SITE_OTHER): Payer: Medicaid Other | Admitting: Podiatry

## 2015-04-13 VITALS — BP 124/71 | HR 88 | Resp 16

## 2015-04-13 DIAGNOSIS — M79672 Pain in left foot: Principal | ICD-10-CM

## 2015-04-13 DIAGNOSIS — M722 Plantar fascial fibromatosis: Secondary | ICD-10-CM

## 2015-04-13 DIAGNOSIS — M79671 Pain in right foot: Secondary | ICD-10-CM

## 2015-04-13 MED ORDER — TRIAMCINOLONE ACETONIDE 10 MG/ML IJ SUSP
10.0000 mg | Freq: Once | INTRAMUSCULAR | Status: AC
  Administered 2015-04-13: 10 mg

## 2015-04-13 MED ORDER — DICLOFENAC SODIUM 75 MG PO TBEC: 75.0000 mg | DELAYED_RELEASE_TABLET | Freq: Two times a day (BID) | ORAL | Status: DC

## 2015-04-13 NOTE — Progress Notes (Signed)
Subjective:     Patient ID: Christina Sparks, female   DOB: 26-Aug-1970, 45 y.o.   MRN: [id number]  HPI patient states my heels have started to hurt again and I know I need some kind of support   Review of Systems     Objective:   Physical Exam Neurovascular status intact with exquisite discomfort plantar heel region bilateral with fluid buildup    Assessment:     Plantar fasciitis bilateral present    Plan:     Reinjected the plantar fascia bilateral 3 mg Kenalog 5 mg Xylocaine and scanned for custom orthotics to reduce plantar stresses

## 2015-09-28 ENCOUNTER — Ambulatory Visit (INDEPENDENT_AMBULATORY_CARE_PROVIDER_SITE_OTHER): Payer: Medicaid Other | Admitting: Internal Medicine

## 2015-09-28 ENCOUNTER — Encounter: Payer: Self-pay | Admitting: Internal Medicine

## 2015-09-28 VITALS — BP 144/80 | HR 82 | Temp 98.3°F | Ht 67.0 in | Wt 275.4 lb

## 2015-09-28 DIAGNOSIS — R030 Elevated blood-pressure reading, without diagnosis of hypertension: Secondary | ICD-10-CM | POA: Diagnosis not present

## 2015-09-28 DIAGNOSIS — IMO0001 Reserved for inherently not codable concepts without codable children: Secondary | ICD-10-CM

## 2015-09-28 DIAGNOSIS — F32A Depression, unspecified: Secondary | ICD-10-CM

## 2015-09-28 DIAGNOSIS — R5382 Chronic fatigue, unspecified: Secondary | ICD-10-CM

## 2015-09-28 DIAGNOSIS — F329 Major depressive disorder, single episode, unspecified: Secondary | ICD-10-CM

## 2015-09-28 DIAGNOSIS — Z0000 Encounter for general adult medical examination without abnormal findings: Secondary | ICD-10-CM

## 2015-09-28 DIAGNOSIS — E785 Hyperlipidemia, unspecified: Secondary | ICD-10-CM

## 2015-09-28 LAB — LIPID PANEL
Cholesterol: 183 mg/dL (ref 125–200)
HDL: 46 mg/dL (ref >=46)
LDL Cholesterol: 118 mg/dL (ref <130)
Total CHOL/HDL Ratio: 4 Ratio (ref <=5.0)
Triglycerides: 94 mg/dL (ref <150)
VLDL: 19 mg/dL (ref <30)

## 2015-09-28 LAB — CBC
HCT: 30.1 % — ABNORMAL LOW (ref 35.0–45.0)
Hemoglobin: 8.9 g/dL — ABNORMAL LOW (ref 11.7–15.5)
MCH: 18.9 pg — ABNORMAL LOW (ref 27.0–33.0)
MCHC: 29.6 g/dL — ABNORMAL LOW (ref 32.0–36.0)
MCV: 63.9 fL — ABNORMAL LOW (ref 80.0–100.0)
MPV: 9.1 fL (ref 7.5–12.5)
Platelets: 434 10*3/uL — ABNORMAL HIGH (ref 140–400)
RBC: 4.71 MIL/uL (ref 3.80–5.10)
RDW: 18.4 % — ABNORMAL HIGH (ref 11.0–15.0)
WBC: 8.9 10*3/uL (ref 3.8–10.8)

## 2015-09-28 LAB — BASIC METABOLIC PANEL WITH GFR
BUN: 8 mg/dL (ref 7–25)
CO2: 27 mmol/L (ref 20–31)
Calcium: 8.6 mg/dL (ref 8.6–10.2)
Chloride: 106 mmol/L (ref 98–110)
Creat: 0.71 mg/dL (ref 0.50–1.10)
GFR, Est African American: >89 mL/min (ref >=60)
GFR, Est Non African American: >89 mL/min (ref >=60)
Glucose, Bld: 85 mg/dL (ref 65–99)
Potassium: 4.4 mmol/L (ref 3.5–5.3)
Sodium: 138 mmol/L (ref 135–146)

## 2015-09-28 MED ORDER — FLUOXETINE HCL 20 MG PO TABS
20.0000 mg | ORAL_TABLET | Freq: Every day | ORAL | Status: DC
Start: 2015-09-28 — End: 2016-04-10

## 2015-09-28 NOTE — Patient Instructions (Signed)
Ms. Neale BurlyFreeman,  I have prescribed prozac for your to restart.  I will call you with your lab results. There are likely several medical options still available to help with heavy vaginal bleeding.  Please follow-up in about 2 weeks for mood and anemia.  Best, Dr. Sampson GoonFitzgerald

## 2015-09-28 NOTE — Progress Notes (Signed)
[facility] Progress Note  Subjective:  Christina Sparks is a 45-y/o female with history of iron-deficiency anemia, migraines, and chronic back pain who presents for fatigue.  Fatigue: - Worsening over the past month. Feels like she has no energy to get through her day. - Wants to go back to sleep as soon as she wakes up.  - Reports history of depression and had been on prozac in the past. Stopped taking medication last year because she was feeling better. Feels like she might be "slipping back into" depression. 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 - Does report waking up frequently overnight due to leg pain. (Has follow-up for plantar fasciitis this week.)  - Mentions concern about heavy periods. Changes her pad about every hour. Has been worked up by gynecology and had normal transvaginal U/S 09/21/14. Endometrial ablation had been offered November 2016 but pt not interested. Is to follow-up in November of this year, per notes. - Patient has not been taking iron supplementation, as she felt her anemia never really improved with taking it.  - Had been placed on phentermine for weight loss by gynecology and said this helped energy and weight loss; requesting refill - No personal or family history of thyroid disorders ROS: No weight loss, no abdominal pain, no dizziness. No bloody stools. Occasional headaches.   Social: Never Smoker  Objective: Blood pressure 144/80, pulse 82, temperature 98.3 F (36.8 C), temperature source Oral, height 5\' 7"  (1.702 m), weight 275 lb 6.4 oz (124.921 kg), last menstrual period 09/17/2015. Constitutional: Obese female, pleasant, in NAD HENT: Pale conjunctiva. No thyromegaly.  Cardiovascular: RRR, S1, S2, no m/r/g.  Pulmonary/Chest: Effort normal and breath sounds normal. No respiratory distress.  Neurological: AOx3, no focal deficits. Skin: Skin is warm and dry.  No rash noted. No erythema.  Psychiatric: Normal mood and affect. Denies SI/HI.  Vitals reviewed  PHQ-9: 20   TSH 09/10/14 was 1.712  Assessment/Plan: Fatigue - Suspect secondary to depression with PHQ-9 score indicating severe depression; chronic anemia likely also contributing.  - Will start patient on 20 mg prozac. Will likely need increase to 40 mg, which patient had been on previously. - Obtain CBC and BMP to check for worsening anemia and electrolyte abnormalities - If CBC still shows microcytic anemia, will recommend restarting iron supplementation. Given pt's age and history of AUB, endometrial biopsy should be recommended.  - If anemia much worse, would recommend urgent gynecology referral.  - Advised patient I would not prescribe stimulants for weight loss due to cardiovascular risk  Elevated BP - No history of HTN. Continue to monitor. - Consider starting antihypertensive medication if pt has another elevated reading  Healthcare maintenance - Will obtain lipid panel, per patient request   Follow-up in 2 weeks to see how pt is doing on prozac and likely increase dose.   Dani GobbleHillary Nai Dasch, MD [facility], PGY-2

## 2015-09-29 ENCOUNTER — Telehealth: Payer: Self-pay | Admitting: Internal Medicine

## 2015-09-29 ENCOUNTER — Encounter: Payer: Self-pay | Admitting: Podiatry

## 2015-09-29 ENCOUNTER — Ambulatory Visit (INDEPENDENT_AMBULATORY_CARE_PROVIDER_SITE_OTHER): Payer: Medicaid Other | Admitting: Podiatry

## 2015-09-29 VITALS — BP 154/80 | HR 83 | Resp 16

## 2015-09-29 DIAGNOSIS — Z0000 Encounter for general adult medical examination without abnormal findings: Secondary | ICD-10-CM | POA: Insufficient documentation

## 2015-09-29 DIAGNOSIS — M722 Plantar fascial fibromatosis: Secondary | ICD-10-CM | POA: Diagnosis not present

## 2015-09-29 DIAGNOSIS — I10 Essential (primary) hypertension: Secondary | ICD-10-CM | POA: Insufficient documentation

## 2015-09-29 DIAGNOSIS — IMO0001 Reserved for inherently not codable concepts without codable children: Secondary | ICD-10-CM | POA: Insufficient documentation

## 2015-09-29 DIAGNOSIS — R030 Elevated blood-pressure reading, without diagnosis of hypertension: Secondary | ICD-10-CM | POA: Insufficient documentation

## 2015-09-29 DIAGNOSIS — D649 Anemia, unspecified: Secondary | ICD-10-CM

## 2015-09-29 MED ORDER — TRIAMCINOLONE ACETONIDE 10 MG/ML IJ SUSP
10.0000 mg | Freq: Once | INTRAMUSCULAR | Status: AC
  Administered 2015-09-29: 10 mg

## 2015-09-29 MED ORDER — FERROUS SULFATE 75 (15 FE) MG/ML PO SOLN: 15.0000 mg | Freq: Every day | ORAL | Status: DC

## 2015-09-29 NOTE — Assessment & Plan Note (Signed)
-   Will obtain lipid panel, per patient request

## 2015-09-29 NOTE — Progress Notes (Signed)
Subjective:     Patient ID: Christina Sparks, female   DOB: 19-Aug-1970, 45 y.o.   MRN: [id number]  HPI patient states her heels have started to hurt her along time again   Review of Systems     Objective:   Physical Exam Neurovascular status intact with exquisite discomfort plantar heel bilateral of 2 weeks duration    Assessment:     Reoccurrence plantar fasciitis bilateral    Plan:     Reinjected the plantar fascia bilateral 3 mg Kenalog 5 mg Xylocaine and instructed on physical therapy supportive shoe gear usage and the importance of orthotics

## 2015-09-29 NOTE — Assessment & Plan Note (Addendum)
-   Suspect secondary to depression with PHQ-9 score indicating severe depression; chronic anemia likely also contributing.  - Will start patient on 20 mg prozac. Will likely need increase to 40 mg, which patient had been on previously. - Obtain CBC and BMP to check for worsening anemia and electrolyte abnormalities - If CBC still shows microcytic anemia, will recommend restarting iron supplementation. Given pt's age and history of AUB, endometrial biopsy should be recommended.  - If anemia much worse, would recommend urgent gynecology referral.  - Advised patient I would not prescribe stimulants for weight loss due to cardiovascular risk

## 2015-09-29 NOTE — Telephone Encounter (Signed)
Left message for patient letting her know that she still has low hemoglobin but that her level is near her past baseline. Refilled pt's iron supplement. Informed her likely next step of her work-up would be an endometrial biopsy for heavy menstrual bleeding in a patient > 46 years of age. Pt to follow-up in 2 weeks to discuss depression after restarting prozac.

## 2015-09-29 NOTE — Assessment & Plan Note (Signed)
-   No history of HTN. Continue to monitor. - Consider starting antihypertensive medication if pt has another elevated reading

## 2016-01-30 ENCOUNTER — Ambulatory Visit: Payer: Self-pay | Admitting: Obstetrics

## 2016-03-06 ENCOUNTER — Ambulatory Visit: Payer: Medicaid Other | Admitting: Obstetrics

## 2016-04-03 ENCOUNTER — Ambulatory Visit: Payer: Medicaid Other | Admitting: Obstetrics

## 2016-04-10 ENCOUNTER — Ambulatory Visit (INDEPENDENT_AMBULATORY_CARE_PROVIDER_SITE_OTHER): Payer: Medicaid Other | Admitting: Obstetrics

## 2016-04-10 ENCOUNTER — Encounter: Payer: Self-pay | Admitting: Obstetrics

## 2016-04-10 ENCOUNTER — Other Ambulatory Visit (HOSPITAL_COMMUNITY)
Admission: RE | Admit: 2016-04-10 | Discharge: 2016-04-10 | Disposition: A | Discharge status: Home / Self Care | Payer: Medicaid Other | Source: Ambulatory Visit | Attending: Obstetrics | Admitting: Obstetrics

## 2016-04-10 DIAGNOSIS — Z01419 Encounter for gynecological examination (general) (routine) without abnormal findings: Secondary | ICD-10-CM | POA: Insufficient documentation

## 2016-04-10 DIAGNOSIS — Z1151 Encounter for screening for human papillomavirus (HPV): Secondary | ICD-10-CM | POA: Insufficient documentation

## 2016-04-10 DIAGNOSIS — IMO0001 Reserved for inherently not codable concepts without codable children: Secondary | ICD-10-CM

## 2016-04-10 DIAGNOSIS — N946 Dysmenorrhea, unspecified: Secondary | ICD-10-CM

## 2016-04-10 DIAGNOSIS — Z113 Encounter for screening for infections with a predominantly sexual mode of transmission: Secondary | ICD-10-CM | POA: Diagnosis present

## 2016-04-10 DIAGNOSIS — Z0000 Encounter for general adult medical examination without abnormal findings: Secondary | ICD-10-CM | POA: Diagnosis not present

## 2016-04-10 MED ORDER — PHENTERMINE HCL 37.5 MG PO CAPS: 37.5000 mg | ORAL_CAPSULE | ORAL | 2 refills | Status: DC

## 2016-04-10 MED ORDER — IBUPROFEN 800 MG PO TABS: 800.0000 mg | ORAL_TABLET | Freq: Three times a day (TID) | ORAL | 6 refills | Status: DC | PRN

## 2016-04-10 NOTE — Progress Notes (Signed)
Patient is interested in weight loss medication.

## 2016-04-10 NOTE — Progress Notes (Signed)
Subjective:        Christina Sparks is a 46 y.o. female here for a routine exam.  Current complaints: none.    Personal health questionnaire:  Is patient Ashkenazi Jewish, have a family history of breast and/or ovarian cancer: no Is there a family history of uterine cancer diagnosed at age < 6350, gastrointestinal cancer, urinary tract cancer, family member who is a Personnel officerLynch syndrome-associated carrier: no Is the patient overweight and hypertensive, family history of diabetes, personal history of gestational diabetes, preeclampsia or PCOS: no Is patient over 4855, have PCOS,  family history of premature CHD under age 47, diabetes, smoke, have hypertension or peripheral artery disease:  no At any time, has a partner hit, kicked or otherwise hurt or frightened you?: no Over the past 2 weeks, have you felt down, depressed or hopeless?: no Over the past 2 weeks, have you felt little interest or pleasure in doing things?:no   Gynecologic History Patient's last menstrual period was 04/01/2016 (exact date). Contraception: tubal ligation Last Pap: 207. Results were: normal Last mammogram: 207. Results were: normal  Obstetric History OB History  Gravida Para Term Preterm AB Living  3 3          SAB TAB Ectopic Multiple Live Births               # Outcome Date GA Lbr Len/2nd Weight Sex Delivery Anes PTL Lv  3 Para           2 Para           1 Para               Past Medical History:  Diagnosis Date  . Anemia   . Back pain   . Knee pain   . Migraines   . S/P bilateral breast reduction   . S/P tubal ligation     Past Surgical History:  Procedure Laterality Date  . BREAST SURGERY     breast reduction  . CESAREAN SECTION    . TUBAL LIGATION       Current Outpatient Prescriptions:  .  ibuprofen (ADVIL,MOTRIN) 800 MG tablet, Take 1 tablet (800 mg total) by mouth every 8 (eight) hours as needed., Disp: 30 tablet, Rfl: 6 .  phentermine 37.5 MG capsule, Take 1 capsule (37.5 mg total)  by mouth every morning., Disp: 30 capsule, Rfl: 2 Allergies  Allergen Reactions  . Sulfa Drugs Cross Reactors Nausea And Vomiting and Swelling    Social History  Substance Use Topics  . Smoking status: Never Smoker  . Smokeless tobacco: Never Used  . Alcohol use No     Comment: occasional    Family History  Problem Relation Age of Onset  . Hypertension Mother   . Heart disease Father   . Hyperlipidemia Father       Review of Systems  Constitutional: negative for fatigue and weight loss Respiratory: negative for cough and wheezing Cardiovascular: negative for chest pain, fatigue and palpitations Gastrointestinal: negative for abdominal pain and change in bowel habits Musculoskeletal:negative for myalgias Neurological: negative for gait problems and tremors Behavioral/Psych: negative for abusive relationship, depression Endocrine: negative for temperature intolerance    Genitourinary:negative for abnormal menstrual periods, genital lesions, hot flashes, sexual problems and vaginal discharge Integument/breast: negative for breast lump, breast tenderness, nipple discharge and skin lesion(s)    Objective:       BP 121/79   Pulse 80   Ht 5\' 7"  (1.702 m)   Wt  261 lb 11.2 oz (118.7 kg)   LMP 04/01/2016 (Exact Date)   BMI 40.99 kg/m  General:   alert  Skin:   no rash or abnormalities  Lungs:   clear to auscultation bilaterally  Heart:   regular rate and rhythm, S1, S2 normal, no murmur, click, rub or gallop  Breasts:   normal without suspicious masses, skin or nipple changes or axillary nodes  Abdomen:  normal findings: no organomegaly, soft, non-tender and no hernia  Pelvis:  External genitalia: normal general appearance Urinary system: urethral meatus normal and bladder without fullness, nontender Vaginal: normal without tenderness, induration or masses Cervix: normal appearance Adnexa: normal bimanual exam Uterus: anteverted and non-tender, normal size   Lab  Review Urine pregnancy test Labs reviewed yes Radiologic studies reviewed yes  50% of 20 min visit spent on counseling and coordination of care.    Assessment:    Healthy female exam.    Dysmenorrhea.  Stable with Ibuprofen.  Obesity.  Requests medical weight loss program with Phentermine.  Screening for STD     Plan:    Ibuprofen Rx  Phentermine Rx  Wet prep and cultures done  Education reviewed: calcium supplements, low fat, low cholesterol diet, safe sex/STD prevention, self breast exams and weight bearing exercise. Contraception: tubal ligation. Follow up in: 1 year.   Meds ordered this encounter  Medications  . phentermine 37.5 MG capsule    Sig: Take 1 capsule (37.5 mg total) by mouth every morning.    Dispense:  30 capsule    Refill:  2  . ibuprofen (ADVIL,MOTRIN) 800 MG tablet    Sig: Take 1 tablet (800 mg total) by mouth every 8 (eight) hours as needed.    Dispense:  30 tablet    Refill:  6   No orders of the defined types were placed in this encounter.    Patient ID: Christina Sparks, female   DOB: 10-Apr-1970, 47 y.o.   MRN: [id number]

## 2016-04-12 ENCOUNTER — Other Ambulatory Visit: Payer: Self-pay | Admitting: Obstetrics

## 2016-04-12 DIAGNOSIS — A599 Trichomoniasis, unspecified: Secondary | ICD-10-CM

## 2016-04-12 LAB — CERVICOVAGINAL ANCILLARY ONLY
Bacterial vaginitis: NEGATIVE
Candida vaginitis: NEGATIVE
Chlamydia: NEGATIVE
Neisseria Gonorrhea: NEGATIVE
Trichomonas: POSITIVE — AB

## 2016-04-12 LAB — CYTOLOGY - PAP
Diagnosis: NEGATIVE
HPV: NOT DETECTED

## 2016-04-12 MED ORDER — TINIDAZOLE 500 MG PO TABS: 2.0000 g | ORAL_TABLET | Freq: Once | ORAL | 0 refills | Status: DC

## 2016-04-13 NOTE — Progress Notes (Signed)
Please make pt aware.

## 2016-04-17 ENCOUNTER — Other Ambulatory Visit: Payer: Self-pay | Admitting: *Deleted

## 2016-04-17 DIAGNOSIS — A599 Trichomoniasis, unspecified: Secondary | ICD-10-CM

## 2016-04-17 MED ORDER — TINIDAZOLE 500 MG PO TABS: 2.0000 g | ORAL_TABLET | Freq: Once | ORAL | 0 refills | Status: AC

## 2016-04-17 NOTE — Progress Notes (Signed)
Pt made aware of results and states she has gotten Rx. Pt advised of all STD recommendations.

## 2016-04-17 NOTE — Progress Notes (Signed)
Pt states that she lost her Rx for Tinidazole when she got out of car. States that Rx fell out and went down street drain. Rx was reordered to pharmacy. Pt made aware uncertain if ins will cover Rx again this soon. Pt advised to call if cannot get Rx. Pt states understanding.

## 2016-09-05 ENCOUNTER — Ambulatory Visit (INDEPENDENT_AMBULATORY_CARE_PROVIDER_SITE_OTHER): Payer: Self-pay | Admitting: Family Medicine

## 2016-09-05 ENCOUNTER — Encounter: Payer: Self-pay | Admitting: Family Medicine

## 2016-09-05 VITALS — BP 118/72 | HR 89 | Temp 98.3°F | Wt 256.8 lb

## 2016-09-05 DIAGNOSIS — L237 Allergic contact dermatitis due to plants, except food: Secondary | ICD-10-CM

## 2016-09-05 MED ORDER — PREDNISONE 20 MG PO TABS: ORAL_TABLET | ORAL | 0 refills | Status: DC

## 2016-09-05 NOTE — Patient Instructions (Signed)
Poison Ivy Dermatitis Poison ivy dermatitis is inflammation of the skin that is caused by the allergens on the leaves of the poison ivy plant. The skin reaction often involves redness, swelling, blisters, and extreme itching. What are the causes? This condition is caused by a specific chemical (urushiol) found in the sap of the poison ivy plant. This chemical is sticky and can be easily spread to people, animals, and objects. You can get poison ivy dermatitis by:  Having direct contact with a poison ivy plant.  Touching animals, other people, or objects that have come in contact with poison ivy and have the chemical on them.  What increases the risk? This condition is more likely to develop in:  People who are outdoors often.  People who go outdoors without wearing protective clothing, such as closed shoes, long pants, and a long-sleeved shirt.  What are the signs or symptoms? Symptoms of this condition include:  Redness and itching.  A rash that often includes bumps and blisters. The rash usually appears 48 hours after exposure.  Swelling. This may occur if the reaction is more severe.  Symptoms usually last for 1-2 weeks. However, the first time you develop this condition, symptoms may last 3-4 weeks. How is this diagnosed? This condition may be diagnosed based on your symptoms and a physical exam. Your health care provider may also ask you about any recent outdoor activity. How is this treated? Treatment for this condition will vary depending on how severe it is. Treatment may include:  Hydrocortisone creams or calamine lotions to relieve itching.  Oatmeal baths to soothe the skin.  Over-the-counter antihistamine tablets.  Oral steroid medicine for more severe outbreaks.  Follow these instructions at home:  Take or apply over-the-counter and prescription medicines only as told by your health care provider.  Wash exposed skin as soon as possible with soap and cold  water.  Use hydrocortisone creams or calamine lotion as needed to soothe the skin and relieve itching.  Take oatmeal baths as needed. Use colloidal oatmeal. You can get this at your local pharmacy or grocery store. Follow the instructions on the packaging.  Do not scratch or rub your skin.  While you have the rash, wash clothes right after you wear them. How is this prevented?  Learn to identify the poison ivy plant and avoid contact with the plant. This plant can be recognized by the number of leaves. Generally, poison ivy has three leaves with flowering branches on a single stem. The leaves are typically glossy, and they have jagged edges that come to a point at the front.  If you have been exposed to poison ivy, thoroughly wash with soap and water right away. You have about 30 minutes to remove the plant resin before it will cause the rash. Be sure to wash under your fingernails because any plant resin there will continue to spread the rash.  When hiking or camping, wear clothes that will help you to avoid exposure on the skin. This includes long pants, a long-sleeved shirt, tall socks, and hiking boots. You can also apply preventive lotion to your skin to help limit exposure.  If you suspect that your clothes or outdoor gear came in contact with poison ivy, rinse them off outside with a garden hose before you bring them inside your house. Contact a health care provider if:  You have open sores in the rash area.  You have more redness, swelling, or pain in the affected area.  You have   redness that spreads beyond the rash area.  You have fluid, blood, or pus coming from the affected area.  You have a fever.  You have a rash over a large area of your body.  You have a rash on your eyes, mouth, or genitals.  Your rash does not improve after a few days. Get help right away if:  Your face swells or your eyes swell shut.  You have trouble breathing.  You have trouble  swallowing. This information is not intended to replace advice given to you by your health care provider. Make sure you discuss any questions you have with your health care provider. Document Released: 03/09/2000 Document Revised: 08/18/2015 Document Reviewed: 08/18/2014 Elsevier Interactive Patient Education  2018 [employer]  

## 2016-09-09 NOTE — Progress Notes (Signed)
Subjective:     Patient ID: Christina Sparks, female   DOB: 03-09-1971, 46 y.o.   MRN: [id number]  HPI Christina Sparks is a 46yo female presenting today for rash. Reports doing yardwork cutting limbs last week. Later that same day, she noted a rash on her bilateral arms. Rash appeared in a linear form. Initially thought it was poison oak since it was consistent with prior exposures, however a few days she began googleing rashes and was concerned it may be scabies or chiggers. She has tried cortisone cream, topical alcohol, and Ivarest (calamine + alcohol + diphenhydramine) without improvement. Nonsmoker.  Review of Systems Per HPI    Objective:   Physical Exam  Constitutional: She appears well-developed and well-nourished. No distress.  Cardiovascular: Normal rate and regular rhythm.   No murmur heard. Pulmonary/Chest: Effort normal. No respiratory distress. She has no wheezes.  Skin:  Maculopapular rash in linear fashion on right and left forearm, no tracking, no other rashes present   Psychiatric: She has a normal mood and affect. Her behavior is normal.      Assessment and Plan:     1. Poison ivy Rash not consistent with chiggers or scabies. Given history and exam, suspect poison ivy. Failed course of topical steroids. Will initiate prednisone taper. May use benadryl as needed itching. Follow up if symptoms worse or fail to resolve.

## 2016-11-07 ENCOUNTER — Telehealth: Payer: Self-pay | Admitting: *Deleted

## 2016-11-07 NOTE — Telephone Encounter (Signed)
Patient should consult her PCP. 

## 2016-11-07 NOTE — Telephone Encounter (Signed)
Patient states she was given a Rx for Phentermine. She would like another 3 month supply. She would prefer tablets. She uses the [facility]. Call to patient- informed message for refill has been forwarded to provider.

## 2016-11-08 NOTE — Telephone Encounter (Signed)
Patient would like Dr Clearance CootsHarper to reconsider refilling her Rx- she was going to a weight loss center but they closed. Does he know of any other places.

## 2016-11-13 NOTE — Telephone Encounter (Signed)
Notified patient.

## 2017-06-18 ENCOUNTER — Other Ambulatory Visit: Payer: Self-pay

## 2017-06-18 ENCOUNTER — Encounter: Payer: Self-pay | Admitting: Family Medicine

## 2017-06-18 ENCOUNTER — Ambulatory Visit (INDEPENDENT_AMBULATORY_CARE_PROVIDER_SITE_OTHER): Payer: Self-pay | Admitting: Family Medicine

## 2017-06-18 VITALS — BP 120/80 | HR 82 | Temp 98.3°F | Wt 270.0 lb

## 2017-06-18 DIAGNOSIS — M5441 Lumbago with sciatica, right side: Secondary | ICD-10-CM

## 2017-06-18 DIAGNOSIS — M62838 Other muscle spasm: Secondary | ICD-10-CM

## 2017-06-18 DIAGNOSIS — D649 Anemia, unspecified: Secondary | ICD-10-CM

## 2017-06-18 DIAGNOSIS — R5383 Other fatigue: Secondary | ICD-10-CM

## 2017-06-18 MED ORDER — DICLOFENAC SODIUM 75 MG PO TBEC: 75.0000 mg | DELAYED_RELEASE_TABLET | Freq: Two times a day (BID) | ORAL | 1 refills | Status: DC

## 2017-06-18 MED ORDER — CYCLOBENZAPRINE HCL 10 MG PO TABS: 10.0000 mg | ORAL_TABLET | Freq: Three times a day (TID) | ORAL | 0 refills | Status: DC | PRN

## 2017-06-18 NOTE — Progress Notes (Signed)
    Subjective:    Patient ID: Christina Sparks, female    DOB: 1971/01/24, 47 y.o.   MRN: [id number]   CC: pain R leg  HPI: patient presenting today complaining of pain that is radiating from her right buttock down her leg. The pain is aching and shooting. It is worse after sitting for prolonged periods of time, which she has to do for work. It is relieved with certain positions to some degree. This has been going on for about 2 weeks and the pain is getting more intense and more constant. She had taken some old diclofenac which provided some relief, requesting more of this medication. She reports she lifted a heavy box at work about 2 weeks ago and felt like she pulled a muscle. Since then the pain has just gotten more severe. She also has right knee pain related to old knee injury that is flaring up because of the way she is walking due to back pain. She denies bladder or bowel incontinence or retention. She denies numbness or tingling in her inner thighs. She denies fevers or chills.   Smoking status reviewed- never smoker  Review of Systems- see HPI   Objective:  BP 120/80   Pulse 82   Temp 98.3 F (36.8 C) (Oral)   Wt 270 lb (122.5 kg)   SpO2 99%   BMI 42.29 kg/m  Vitals and nursing note reviewed  General: overweight, well nourished, in no acute distress HEENT: normocephalic, MMM  Neck: supple, non-tender, without lymphadenopathy Cardiac: RRR, clear S1 and S2, no murmurs, rubs, or gallops Respiratory: clear to auscultation bilaterally, no increased work of breathing Back: tender to palpation over midline lumbar spine with diffuse tenderness of paraspinal musculature. No edema or erythema noted. Tender to palpation over piriformis muscle on right buttock. Extremities: no edema or cyanosis. Strength 5/5 in LE bilaterally. Sensation in tact throughout.  Skin: warm and dry, no rashes noted Neuro: alert and oriented, no focal deficits   Assessment & Plan:    1. Muscle spasm Rx  given for flexeril to use for muscle spasm of back  2. Fatigue, unspecified type Has history of anemia and depression. Denies depression currently. Taking iron. Would like to check hemoglobin and TSH today.  - CBC - TSH  3. Acute bilateral low back pain with right-sided sciatica Consistent with MSK etiology. No red flag symptoms to suggest cauda equina. Overall patient has known spinal stenosis in her cervical and thoracic spine (Reviewed MRI images from 2016). Likely has some degree in lumbar spine as well causing sciatic nerve compression vs piriformis syndrome on the right. Discussed need for weight loss to help overall. Reviewed exercises for piriformis syndrome and low back rehab. Rx for voltaren given. Alternate ice/heat as needed. Follow up as needed  4. Anemia, unspecified type Hx of iron deficiency anemia, she is taking OTC iron supplementation but would like to make sure her hemoglobin is stable - CBC   Return if symptoms worsen or fail to improve.   Dolores PattyAngela Kamon Fahr, DO Family Medicine Resident PGY-2

## 2017-06-18 NOTE — Patient Instructions (Addendum)
It was nice to see you today  Please do the stretch for your piriformis muscle like I showed you as often as you can throughout the day. Be careful lifting heavy boxes at work. Try to exercise as much as you can as movement will help with muscle tightness and weight loss will help relieve stress on your back and knees.  If you have questions or concerns please do not hesitate to call at [phone].  Christina PattyAngela Mesiah Manzo, DO PGY-2, [facility] 06/18/2017 11:17 AM   Piriformis Syndrome Rehab Ask your health care provider which exercises are safe for you. Do exercises exactly as told by your health care provider and adjust them as directed. It is normal to feel mild stretching, pulling, tightness, or discomfort as you do these exercises, but you should stop right away if you feel sudden pain or your pain gets worse.Do not begin these exercises until told by your health care provider. Stretching and range of motion exercises These exercises warm up your muscles and joints and improve the movement and flexibility of your hip and pelvis. These exercises also help to relieve pain, numbness, and tingling. Exercise A: Hip rotators  1. Lie on your back on a firm surface. 2. Pull your left / right knee toward your same shoulder with your left / right hand until your knee is pointing toward the ceiling. Hold your left / right ankle with your other hand. 3. Keeping your knee steady, gently pull your left / right ankle toward your other shoulder until you feel a stretch in your buttocks. 4. Hold this position for __________ seconds. Repeat __________ times. Complete this stretch __________ times a day. Exercise B: Hip extensors 1. Lie on your back on a firm surface. Both of your legs should be straight. 2. Pull your left / right knee to your chest. Hold your leg in this position by holding onto the back of your thigh or the front of your knee. 3. Hold this position for __________  seconds. 4. Slowly return to the starting position. Repeat __________ times. Complete this stretch __________ times a day. Strengthening exercises These exercises build strength and endurance in your hip and thigh muscles. Endurance is the ability to use your muscles for a long time, even after they get tired. Exercise C: Straight leg raises ( hip abductors) 1. Lie on your side with your left / right leg in the top position. Lie so your head, shoulder, knee, and hip line up. Bend your bottom knee to help you balance. 2. Lift your top leg up 4-6 inches (10-15 cm), keeping your toes pointed straight ahead. 3. Hold this position for __________ seconds. 4. Slowly lower your leg to the starting position. Let your muscles relax completely. Repeat __________ times. Complete this exercise__________ times a day. Exercise D: Hip abductors and rotators, quadruped  1. Get on your hands and knees on a firm, lightly padded surface. Your hands should be directly below your shoulders, and your knees should be directly below your hips. 2. Lift your left / right knee out to the side. Keep your knee bent. Do not twist your body. 3. Hold this position for __________ seconds. 4. Slowly lower your leg. Repeat __________ times. Complete this exercise__________ times a day. Exercise E: Straight leg raises ( hip extensors) 1. Lie on your abdomen on a bed or a firm surface with a pillow under your hips. 2. Squeeze your buttock muscles and lift your left / right thigh off the  bed. Do not let your back arch. 3. Hold this position for __________ seconds. 4. Slowly return to the starting position. Let your muscles relax completely before doing another repetition. Repeat __________ times. Complete this exercise__________ times a day. This information is not intended to replace advice given to you by your health care provider. Make sure you discuss any questions you have with your health care provider. Document Released:  03/12/2005 Document Revised: 11/15/2015 Document Reviewed: 02/22/2015 Elsevier Interactive Patient Education  2018 [employer].   Back Pain, Adult Many adults have back pain from time to time. Common causes of back pain include:  A strained muscle or ligament.  Wear and tear (degeneration) of the spinal disks.  Arthritis.  A hit to the back.  Back pain can be short-lived (acute) or last a long time (chronic). A physical exam, lab tests, and imaging studies may be done to find the cause of your pain. Follow these instructions at home: Managing pain and stiffness  Take over-the-counter and prescription medicines only as told by your health care provider.  If directed, apply heat to the affected area as often as told by your health care provider. Use the heat source that your health care provider recommends, such as a moist heat pack or a heating pad. ? Place a towel between your skin and the heat source. ? Leave the heat on for 20-30 minutes. ? Remove the heat if your skin turns bright red. This is especially important if you are unable to feel pain, heat, or cold. You have a greater risk of getting burned.  If directed, apply ice to the injured area: ? Put ice in a plastic bag. ? Place a towel between your skin and the bag. ? Leave the ice on for 20 minutes, 2-3 times a day for the first 2-3 days. Activity  Do not stay in bed. Resting more than 1-2 days can delay your recovery.  Take short walks on even surfaces as soon as you are able. Try to increase the length of time you walk each day.  Do not sit, drive, or stand in one place for more than 30 minutes at a time. Sitting or standing for long periods of time can put stress on your back.  Use proper lifting techniques. When you bend and lift, use positions that put less stress on your back: ? [place] your knees. ? Keep the load close to your body. ? Avoid twisting.  Exercise regularly as told by your health care provider.  Exercising will help your back heal faster. This also helps prevent back injuries by keeping muscles strong and flexible.  Your health care provider may recommend that you see a physical therapist. This person can help you come up with a safe exercise program. Do any exercises as told by your physical therapist. Lifestyle  Maintain a healthy weight. Extra weight puts stress on your back and makes it difficult to have good posture.  Avoid activities or situations that make you feel anxious or stressed. Learn ways to manage anxiety and stress. One way to manage stress is through exercise. Stress and anxiety increase muscle tension and can make back pain worse. General instructions  Sleep on a firm mattress in a comfortable position. Try lying on your side with your knees slightly bent. If you lie on your back, put a pillow under your knees.  Follow your treatment plan as told by your health care provider. This may include: ? Cognitive or behavioral therapy. ?  Acupuncture or massage therapy. ? Meditation or yoga. Contact a health care provider if:  You have pain that is not relieved with rest or medicine.  You have increasing pain going down into your legs or buttocks.  Your pain does not improve in 2 weeks.  You have pain at night.  You lose weight.  You have a fever or chills. Get help right away if:  You develop new bowel or bladder control problems.  You have unusual weakness or numbness in your arms or legs.  You develop nausea or vomiting.  You develop abdominal pain.  You feel faint. Summary  Many adults have back pain from time to time. A physical exam, lab tests, and imaging studies may be done to find the cause of your pain.  Use proper lifting techniques. When you bend and lift, use positions that put less stress on your back.  Take over-the-counter and prescription medicines and apply heat or ice as directed by your health care provider. This information is not  intended to replace advice given to you by your health care provider. Make sure you discuss any questions you have with your health care provider. Document Released: 03/12/2005 Document Revised: 04/16/2016 Document Reviewed: 04/16/2016 Elsevier Interactive Patient Education  [employer].

## 2017-06-19 ENCOUNTER — Encounter: Payer: Self-pay | Admitting: Family Medicine

## 2017-06-19 LAB — TSH: TSH: 1.72 u[IU]/mL (ref 0.450–4.500)

## 2017-06-19 LAB — CBC
Hematocrit: 33.9 % — ABNORMAL LOW (ref 34.0–46.6)
Hemoglobin: 10 g/dL — ABNORMAL LOW (ref 11.1–15.9)
MCH: 19.2 pg — ABNORMAL LOW (ref 26.6–33.0)
MCHC: 29.5 g/dL — ABNORMAL LOW (ref 31.5–35.7)
MCV: 65 fL — ABNORMAL LOW (ref 79–97)
Platelets: 364 10*3/uL (ref 150–379)
RBC: 5.21 x10E6/uL (ref 3.77–5.28)
RDW: 20.2 % — ABNORMAL HIGH (ref 12.3–15.4)
WBC: 7.7 10*3/uL (ref 3.4–10.8)

## 2017-07-28 ENCOUNTER — Other Ambulatory Visit: Payer: Self-pay | Admitting: Family Medicine

## 2017-08-09 ENCOUNTER — Telehealth: Payer: Self-pay | Admitting: Family Medicine

## 2017-08-09 NOTE — Telephone Encounter (Signed)
Will forward to MD. Dragan Tamburrino,CMA  

## 2017-08-09 NOTE — Telephone Encounter (Signed)
Pt would like her results from blood work done at her visit.

## 2017-08-11 ENCOUNTER — Other Ambulatory Visit: Payer: Self-pay | Admitting: Obstetrics

## 2017-08-11 DIAGNOSIS — N946 Dysmenorrhea, unspecified: Secondary | ICD-10-CM

## 2017-08-12 NOTE — Telephone Encounter (Signed)
LM for patient ok per DPR with normal results. Audrick Lamoureaux,CMA  

## 2017-08-12 NOTE — Telephone Encounter (Signed)
Please let patient know her thyroid level and blood counts all normal. Thanks.

## 2017-08-14 ENCOUNTER — Ambulatory Visit: Payer: Self-pay | Admitting: Internal Medicine

## 2017-09-04 ENCOUNTER — Ambulatory Visit: Payer: Self-pay

## 2017-09-05 ENCOUNTER — Other Ambulatory Visit: Payer: Self-pay

## 2017-09-05 ENCOUNTER — Encounter: Payer: Self-pay | Admitting: Family Medicine

## 2017-09-05 ENCOUNTER — Ambulatory Visit: Payer: Self-pay | Admitting: Family Medicine

## 2017-09-05 VITALS — BP 130/80 | HR 84 | Temp 98.2°F | Wt 278.0 lb

## 2017-09-05 DIAGNOSIS — M545 Low back pain, unspecified: Secondary | ICD-10-CM

## 2017-09-05 DIAGNOSIS — N3000 Acute cystitis without hematuria: Secondary | ICD-10-CM

## 2017-09-05 DIAGNOSIS — M4802 Spinal stenosis, cervical region: Secondary | ICD-10-CM

## 2017-09-05 LAB — POCT URINALYSIS DIP (MANUAL ENTRY)
Bilirubin, UA: NEGATIVE
Glucose, UA: NEGATIVE mg/dL
Ketones, POC UA: NEGATIVE mg/dL
Nitrite, UA: POSITIVE — AB
Protein Ur, POC: 100 mg/dL — AB
Spec Grav, UA: >=1.03 — AB
Urobilinogen, UA: 1 U/dL
pH, UA: 6

## 2017-09-05 LAB — POCT UA - MICROSCOPIC ONLY

## 2017-09-05 MED ORDER — GABAPENTIN 100 MG PO CAPS: 100.0000 mg | ORAL_CAPSULE | Freq: Three times a day (TID) | ORAL | 0 refills | Status: DC | PRN

## 2017-09-05 MED ORDER — CEPHALEXIN 500 MG PO CAPS: 500.0000 mg | ORAL_CAPSULE | Freq: Four times a day (QID) | ORAL | 0 refills | Status: AC

## 2017-09-05 NOTE — Patient Instructions (Signed)
   It was good to see you today.  Please take the antibiotic every 6 hours for 7 days. Please drink plenty of water.   If you develop fevers, nausea or vomiting please go to ED to be evaluated.  I will call you if the urine culture gives us information that we need to change antibiotic.  If you have questions or concerns please do not hesitate to call at [phone].  Dolores PattyAngela Kaidon Kinker, DO PGY-2, [facility] 09/05/2017 3:16 PM    Urinary Tract Infection, Adult A urinary tract infection (UTI) is an infection of any part of the urinary tract. The urinary tract includes the:  Kidneys.  Ureters.  Bladder.  Urethra.  These organs make, store, and get rid of pee (urine) in the body. Follow these instructions at home:  Take over-the-counter and prescription medicines only as told by your doctor.  If you were prescribed an antibiotic medicine, take it as told by your doctor. Do not stop taking the antibiotic even if you start to feel better.  Avoid the following drinks: ? Alcohol. ? Caffeine. ? Tea. ? Carbonated drinks.  Drink enough fluid to keep your pee clear or pale yellow.  Keep all follow-up visits as told by your doctor. This is important.  Make sure to: ? Empty your bladder often and completely. Do not to hold pee for long periods of time. ? Empty your bladder before and after sex. ? Wipe from front to back after a bowel movement if you are female. Use each tissue one time when you wipe. Contact a doctor if:  You have back pain.  You have a fever.  You feel sick to your stomach (nauseous).  You throw up (vomit).  Your symptoms do not get better after 3 days.  Your symptoms go away and then come back. Get help right away if:  You have very bad back pain.  You have very bad lower belly (abdominal) pain.  You are throwing up and cannot keep down any medicines or water. This information is not intended to replace advice given to you by your  health care provider. Make sure you discuss any questions you have with your health care provider. Document Released: 08/29/2007 Document Revised: 08/18/2015 Document Reviewed: 01/31/2015 Elsevier Interactive Patient Education  [employer]2018 [employer] Inc.

## 2017-09-05 NOTE — Progress Notes (Signed)
    Subjective:    Patient ID: Christina Sparks, female    DOB: 01/08/1971, 47 y.o.   MRN: [id number]   CC: dysuria  Reports urgency, frequency for past several days. Was sick with URI last week and felt very badly, lost voice, had intermittent fevers. She reports she wasn't drink water as she should at this time. She then developed lower abdominal pain and urgency/frequency. She felt she had a UTI. She has felt warm but no formal temperature taken. No nausea or vomiting. No flank pain. Has not seen any blood in her urine. She will pee normally and then feel the urge to urinate several minutes later but only dribble.  Also has bilateral UE tingling. Review of cervical MRI from 2016 shows canal stenosis. Patient surprised to hear this, does not recall these results. She does not want any sort of surgical intervention.  Smoking status reviewed- non-smoker  Review of Systems- see HPI. Additionally, denies changes in bowel habits, denies numbness/tingling of inner thighs   Objective:  BP 130/80   Pulse 84   Temp 98.2 F (36.8 C) (Oral)   Wt 278 lb (126.1 kg)   LMP 08/23/2017   SpO2 99%   BMI 43.54 kg/m  Vitals and nursing note reviewed  General: well appearing, well nourished, in no acute distress HEENT: normocephalic, MMM Cardiac: RRR, clear S1 and S2, no murmurs, rubs, or gallops Respiratory: clear to auscultation bilaterally, no increased work of breathing Abdomen: obese abdomen. Tender to palpation in suprapubic region. NO CVA tenderness. Extremities: no edema or cyanosis.  Skin: warm and dry, no rashes noted Neuro: alert and oriented, no focal deficits   Assessment & Plan:   1. Acute cystitis without hematuria UA indicative of infection and patient symptomatic. Will send for culture and treat with keflex for 7 days. Discussed reasons to go to ED if patient develops fevers or nausea/vomiting, flank pain as this could indicate kidney infection and require IV fluids and  antibiotics. She is agreeable. Will call her if culture indicates keflex is not appropriate.  - Urine Culture - POCT urinalysis dipstick - POCT UA - Microscopic Only  2. Cervical stenosis of spine Discussed MRI results from 2016 with patient. She does not want any surgical intervention for stenotic changes of c-spine. Will try gabapentin for symptomatic relief. Follow up as needed    Return if symptoms worsen or fail to improve.   Dolores PattyAngela Shiva Sahagian, DO Family Medicine Resident PGY-2

## 2017-09-16 ENCOUNTER — Other Ambulatory Visit: Payer: Self-pay | Admitting: Family Medicine

## 2017-10-02 ENCOUNTER — Other Ambulatory Visit: Payer: Self-pay | Admitting: Family Medicine

## 2018-04-03 ENCOUNTER — Telehealth: Payer: Self-pay

## 2018-04-03 NOTE — Telephone Encounter (Signed)
Patient left message that she believes she has pink eye in left eye. Symptoms x 3 days, getting worse. Swelling, discharge, having to use a wash cloth to open it.  Requests to have a Rx called in so she doesn't have to come into office and can treat it and not miss work.  Call back is [phone]  Ples Specter, RN [facility] RN)

## 2018-04-04 NOTE — Telephone Encounter (Signed)
Patient should be seen and evaluated before prescribing antibiotics over the phone. Thanks!

## 2018-04-07 NOTE — Telephone Encounter (Signed)
Spoke with pt. She said that she was placed on the cancellation list for appts, and that her sx have actually cleared up. Pt states everything is good right now. Aquilla Solian, CMA

## 2018-06-02 ENCOUNTER — Ambulatory Visit: Payer: Self-pay | Admitting: Family Medicine

## 2018-06-02 ENCOUNTER — Encounter: Payer: Self-pay | Admitting: Family Medicine

## 2018-06-02 VITALS — BP 120/70 | HR 75 | Temp 98.7°F | Wt 280.0 lb

## 2018-06-02 DIAGNOSIS — N926 Irregular menstruation, unspecified: Secondary | ICD-10-CM

## 2018-06-02 DIAGNOSIS — M62838 Other muscle spasm: Secondary | ICD-10-CM

## 2018-06-02 DIAGNOSIS — D649 Anemia, unspecified: Secondary | ICD-10-CM

## 2018-06-02 DIAGNOSIS — R202 Paresthesia of skin: Secondary | ICD-10-CM

## 2018-06-02 DIAGNOSIS — Z6841 Body Mass Index (BMI) 40.0 and over, adult: Secondary | ICD-10-CM

## 2018-06-02 MED ORDER — GABAPENTIN 100 MG PO CAPS: ORAL_CAPSULE | ORAL | 0 refills | Status: DC

## 2018-06-02 MED ORDER — CYCLOBENZAPRINE HCL 10 MG PO TABS: ORAL_TABLET | ORAL | 0 refills | Status: DC

## 2018-06-02 NOTE — Progress Notes (Signed)
    Subjective:    Patient ID: Christina Sparks, female    DOB: [birth year], 48 y.o.   MRN: [id number]   CC: irregular menses  HPI: patient reports periods have started coming irregularly over the past 2 months. She went without a period for 4 weeks then had 2 back to back. She had passed dark tissue material and then had a more normal period but is now passing dark tissue again. Some cramping. Some RLQ pain in the morning.  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 She needs refill on gabapentin. Has known stenosis in her cervical spine but does not want any intervention on this at this time.  Muscle spasm- gets this occasionally in her legs, flexeril helps but cant take it w/ driving. Needs refill.  Anemia- taking iron daily, wants to have CBC checked. She denies SOB, palpitations, fatigue, DOE.   Smoking status reviewed- non-smoker  Review of Systems- see HPI   Objective:  BP 120/70   Pulse 75   Temp 98.7 F (37.1 C) (Oral)   Wt 280 lb (127 kg)   SpO2 98%   BMI 43.85 kg/m  Vitals and nursing note reviewed  General: well nourished, in no acute distress HEENT: normocephalic, MMM Cardiac: RRR, clear S1 and S2, no murmurs, rubs, or gallops Respiratory: clear to auscultation bilaterally, no increased work of breathing Abdomen: soft, nontender Skin: warm and dry, no rashes noted Neuro: alert and oriented, no focal deficits   Assessment & Plan:    Anemia  Check CBC today.  Irregular menstrual cycle  Likely perimenopausal. Patient interested in controlling irregular menses with birth control but wants to get labs done first. Will check FSH and HCG  Paresthesia of hand  D/t cervical stenosis, gabapentin refilled  Muscle spasm  Refilled flexeril    Return if symptoms worsen or fail to improve.   Dolores Patty, DO Family Medicine Resident PGY-3

## 2018-06-02 NOTE — Patient Instructions (Addendum)
Good to see you today!  Please start tracking calories. Aim for 1800 a day.  We'll check labs and I'll let you know the results.  If you have questions or concerns please do not hesitate to call at [phone].  Christina Patty, DO PGY-3, [facility] 06/02/2018 4:45 PM   Perimenopause  Perimenopause is the normal time of life before and after menstrual periods stop completely (menopause). Perimenopause can begin 2-8 years before menopause, and it usually lasts for 1 year after menopause. During perimenopause, the ovaries may or may not produce an egg. What are the causes? This condition is caused by a natural change in hormone levels that happens as you get older. What increases the risk? This condition is more likely to start at an earlier age if you have certain medical conditions or treatments, including:  A tumor of the pituitary gland in the brain.  A disease that affects the ovaries and hormone production.  Radiation treatment for cancer.  Certain cancer treatments, such as chemotherapy or hormone (anti-estrogen) therapy.  Heavy smoking and excessive alcohol use.  Family history of early menopause. What are the signs or symptoms? Perimenopausal changes affect each woman differently. Symptoms of this condition may include:  Hot flashes.  Night sweats.  Irregular menstrual periods.  Decreased sex drive.  Vaginal dryness.  Headaches.  Mood swings.  Depression.  Memory problems or trouble concentrating.  Irritability.  Tiredness.  Weight gain.  Anxiety.  Trouble getting pregnant. How is this diagnosed? This condition is diagnosed based on your medical history, a physical exam, your age, your menstrual history, and your symptoms. Hormone tests may also be done. How is this treated? In some cases, no treatment is needed. You and your health care provider should make a decision together about whether treatment is necessary. Treatment will  be based on your individual condition and preferences. Various treatments are available, such as:  Menopausal hormone therapy (MHT).  Medicines to treat specific symptoms.  Acupuncture.  Vitamin or herbal supplements. Before starting treatment, make sure to let your health care provider know if you have a personal or family history of:  Heart disease.  Breast cancer.  Blood clots.  Diabetes.  Osteoporosis. Follow these instructions at home: Lifestyle  Do not use any products that contain nicotine or tobacco, such as cigarettes and e-cigarettes. If you need help quitting, ask your health care provider.  Eat a balanced diet that includes fresh fruits and vegetables, whole grains, soybeans, eggs, lean meat, and low-fat dairy.  Get at least 30 minutes of physical activity on 5 or more days each week.  Avoid alcoholic and caffeinated beverages, as well as spicy foods. This may help prevent hot flashes.  Get 7-8 hours of sleep each night.  Dress in layers that can be removed to help you manage hot flashes.  Find ways to manage stress, such as deep breathing, meditation, or journaling. General instructions  Keep track of your menstrual periods, including: ? When they occur. ? How heavy they are and how long they last. ? How much time passes between periods.  Keep track of your symptoms, noting when they start, how often you have them, and how long they last.  Take over-the-counter and prescription medicines only as told by your health care provider.  Take vitamin supplements only as told by your health care provider. These may include calcium, vitamin E, and vitamin D.  Use vaginal lubricants or moisturizers to help with vaginal dryness and improve comfort  during sex.  Talk with your health care provider before starting any herbal supplements.  Keep all follow-up visits as told by your health care provider. This is important. This includes any group therapy or  counseling. Contact a health care provider if:  You have heavy vaginal bleeding or pass blood clots.  Your period lasts more than 2 days longer than normal.  Your periods are recurring sooner than 21 days.  You bleed after having sex. Get help right away if:  You have chest pain, trouble breathing, or trouble talking.  You have severe depression.  You have pain when you urinate.  You have severe headaches.  You have vision problems. Summary  Perimenopause is the time when a woman's body begins to move into menopause. This may happen naturally or as a result of other health problems or medical treatments.  Perimenopause can begin 2-8 years before menopause, and it usually lasts for 1 year after menopause.  Perimenopausal symptoms can be managed through medicines, lifestyle changes, and complementary therapies such as acupuncture. This information is not intended to replace advice given to you by your health care provider. Make sure you discuss any questions you have with your health care provider. Document Released: 04/19/2004 Document Revised: 04/17/2016 Document Reviewed: 04/17/2016 Elsevier Interactive Patient Education  2019 [employer].

## 2018-06-03 ENCOUNTER — Telehealth: Payer: Self-pay | Admitting: Family Medicine

## 2018-06-03 DIAGNOSIS — N926 Irregular menstruation, unspecified: Secondary | ICD-10-CM | POA: Insufficient documentation

## 2018-06-03 LAB — CBC
Hematocrit: 37.3 % (ref 34.0–46.6)
Hemoglobin: 11.3 g/dL (ref 11.1–15.9)
MCH: 21.1 pg — ABNORMAL LOW (ref 26.6–33.0)
MCHC: 30.3 g/dL — ABNORMAL LOW (ref 31.5–35.7)
MCV: 70 fL — ABNORMAL LOW (ref 79–97)
Platelets: 350 10*3/uL (ref 150–450)
RBC: 5.35 x10E6/uL — ABNORMAL HIGH (ref 3.77–5.28)
RDW: 19 % — ABNORMAL HIGH (ref 11.7–15.4)
WBC: 9.3 10*3/uL (ref 3.4–10.8)

## 2018-06-03 LAB — FOLLICLE STIMULATING HORMONE: FSH: 9.3 m[IU]/mL

## 2018-06-03 LAB — BETA HCG QUANT (REF LAB): hCG Quant: <1 m[IU]/mL

## 2018-06-03 MED ORDER — DESOGESTREL-ETHINYL ESTRADIOL 0.15-0.02/0.01 MG (21/5) PO TABS: 1.0000 | ORAL_TABLET | Freq: Every day | ORAL | 11 refills | Status: DC

## 2018-06-03 NOTE — Assessment & Plan Note (Signed)
  Likely perimenopausal. Patient interested in controlling irregular menses with birth control but wants to get labs done first. Will check FSH and HCG

## 2018-06-03 NOTE — Assessment & Plan Note (Signed)
Check CBC today.  

## 2018-06-03 NOTE — Telephone Encounter (Signed)
  Called Ms. Shuba to discuss lab results. Hemoglobin in stable range. HCG negative. FSH not high to indicate menopausal changes, feel she will have these irregular periods for some time. She is interested in OCPs to control irregular bleeding. Will send in rx to pharmacy. Would recommend staying on these 1-2 years then trialing off to see where her menstrual cycle is. Patient verbalized understanding and agreement with plan.  Dolores Patty, DO PGY-3, [facility] 06/03/2018 3:07 PM

## 2018-06-03 NOTE — Assessment & Plan Note (Signed)
  D/t cervical stenosis, gabapentin refilled

## 2018-06-03 NOTE — Assessment & Plan Note (Signed)
Refilled flexeril.

## 2018-09-19 ENCOUNTER — Telehealth: Payer: Self-pay | Admitting: *Deleted

## 2018-09-19 NOTE — Telephone Encounter (Signed)
Pt lm on VM stating that she has questions about TB.  Called back, no answer, LMOVM for her to call back. Christen Bame, CMA

## 2018-09-22 ENCOUNTER — Other Ambulatory Visit: Payer: Self-pay

## 2018-09-22 ENCOUNTER — Ambulatory Visit (INDEPENDENT_AMBULATORY_CARE_PROVIDER_SITE_OTHER): Payer: Self-pay | Admitting: Family Medicine

## 2018-09-22 ENCOUNTER — Other Ambulatory Visit (HOSPITAL_COMMUNITY)
Admission: RE | Admit: 2018-09-22 | Discharge: 2018-09-22 | Disposition: A | Discharge status: Home / Self Care | Payer: Self-pay | Source: Ambulatory Visit | Attending: Family Medicine | Admitting: Family Medicine

## 2018-09-22 VITALS — BP 105/78 | HR 88 | Wt 257.0 lb

## 2018-09-22 DIAGNOSIS — Z113 Encounter for screening for infections with a predominantly sexual mode of transmission: Secondary | ICD-10-CM

## 2018-09-22 DIAGNOSIS — N898 Other specified noninflammatory disorders of vagina: Secondary | ICD-10-CM

## 2018-09-22 LAB — POCT WET PREP (WET MOUNT)
Clue Cells Wet Prep Whiff POC: NEGATIVE
Trichomonas Wet Prep HPF POC: ABSENT

## 2018-09-22 MED ORDER — FLUCONAZOLE 150 MG PO TABS: 150.0000 mg | ORAL_TABLET | Freq: Once | ORAL | 0 refills | Status: AC

## 2018-09-22 NOTE — Patient Instructions (Signed)
  Good to see you today! We'll call you with lab results. Keep up the great work with weight loss!  If you have questions or concerns please do not hesitate to call at [phone].  Lucila Maine, DO PGY-3, [facility] 09/22/2018 4:33 PM

## 2018-09-22 NOTE — Progress Notes (Signed)
    Subjective:    Patient ID: Christina Sparks, female    DOB: 08/10/1970, 48 y.o.   MRN: [id number]   CC: vaginal discharge   HPI: has noticed discharge, is planning on being sexually active and wants to make sure she has no STDs prior to. She also has been cleaning with a new washcloth down there and felt like it irritated things. Denies itching, burning, pain. No dysuria. No flank pain or pelvic pain.   She is following w/ weight loss clinic to lose weight and has lost 20 pounds. She is taking phentermine.   Smoking status reviewed- non-smoker  Review of Systems- see HPI   Objective:  BP 105/78   Pulse 88   Wt 257 lb (116.6 kg)   SpO2 99%   BMI 40.25 kg/m  Vitals and nursing note reviewed  General: well nourished, in no acute distress HEENT: normocephalic, MMM Cardiac: RRR, clear S1 and S2, no murmurs, rubs, or gallops Respiratory: no increased work of breathing Abdomen: soft, nontender GU: normal external female genitalia, moderate amount of white discharge present, no CMT, cervix pink w/o lesion Extremities: no edema or cyanosis. Neuro: alert and oriented, no focal deficits   Assessment & Plan:   1. Vaginal discharge -yeast on wet prep, rx for diflucan sent to pharmacy to use x 1, follow up if no improvement. Gc/chlamydia sent to lab will call patient with results  - POCT Wet Prep [facility] [place]) - Cervicovaginal ancillary only  2. Screening for STD (sexually transmitted disease) - HIV antibody (with reflex) - RPR  Return as needed.   Lucila Maine, DO Family Medicine Resident PGY-3

## 2018-09-23 LAB — HIV ANTIBODY (ROUTINE TESTING W REFLEX): HIV Screen 4th Generation wRfx: NONREACTIVE

## 2018-09-23 LAB — RPR: RPR Ser Ql: NONREACTIVE

## 2018-09-24 LAB — CERVICOVAGINAL ANCILLARY ONLY
Chlamydia: NEGATIVE
Neisseria Gonorrhea: NEGATIVE

## 2018-11-21 ENCOUNTER — Ambulatory Visit: Payer: Self-pay

## 2018-12-10 ENCOUNTER — Ambulatory Visit (INDEPENDENT_AMBULATORY_CARE_PROVIDER_SITE_OTHER): Payer: Self-pay | Admitting: Family Medicine

## 2018-12-10 ENCOUNTER — Other Ambulatory Visit: Payer: Self-pay

## 2018-12-10 ENCOUNTER — Encounter: Payer: Self-pay | Admitting: Family Medicine

## 2018-12-10 VITALS — BP 120/70 | HR 88 | Temp 98.1°F | Wt 244.0 lb

## 2018-12-10 DIAGNOSIS — G8929 Other chronic pain: Secondary | ICD-10-CM

## 2018-12-10 DIAGNOSIS — M62838 Other muscle spasm: Secondary | ICD-10-CM

## 2018-12-10 DIAGNOSIS — M545 Low back pain: Secondary | ICD-10-CM

## 2018-12-10 MED ORDER — CYCLOBENZAPRINE HCL 10 MG PO TABS: ORAL_TABLET | ORAL | 0 refills | Status: DC

## 2018-12-10 NOTE — Progress Notes (Signed)
Subjective:    Patient ID: Christina Sparks, female    DOB: 1970-08-14, 48 y.o.   MRN: [id number]   CC: Chronic lower back pain  HPI:  Chronic lower back pain: Patient is a very pleasant 48 year old female that presents today with 6 months of progressive chronic lower back pain without sciatica.  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  She states this pain is located over the lower portion of her back and that at times this pain becomes constant.  She states that sometimes this pain will shoot down to the top part of her hip and that she will have some pain in her glutes on either the left or the right side.  Patient has tried using 800 mg Aleve along with Tylenol or 800 mg ibuprofen along with Tylenol almost daily for the past 1 month to help relieve her back pain.  She states that these have not dramatically helped lately.  She states that she delayed care partly due to lack of health insurance though she expects to get this in the near future with her current position.  Patient denies loss of bowel or bladder function, saddle paresthesias, weakness in either leg.  Lower limb muscle spasms: Patient has a past medical history significant for muscle spasms of her lower leg.  She states that as she has become more active these have gotten worse.  Prior to these worsening she also ran out of her Flexeril and did not have an opportunity to get this refilled.  She states that for the past 3 months she has been working on walking approximately 3 miles per day and has lost about 30 pounds due to a combination of this extra exercise and her diet.  She states that she believes this may be related to her crampy leg pain/muscle spasms.  She states that these do not only occur when she is exercising but also when she has "days off" from work or exercise and these often occur at night.  She states  that in the past her Flexeril worked really well for these.   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  She states this may be contributing to her lower back and leg pain.   Objective:  BP 120/70    Pulse 88    Temp 98.1 F (36.7 C) (Oral)    Wt 244 lb (110.7 kg)    SpO2 99%    BMI 38.22 kg/m   Vitals and nursing note reviewed  General: NAD, pleasant, able to participate in exam Cardiac: RRR, no murmurs. Respiratory: CTAB Extremities: no edema or cyanosis, lower limb peripheral pulses 2+ bilaterally. MSK: Strength 5/5 in hip flexors, knee extensors bilaterally.  Straight leg raise does not elicit radicular pain.  Tenderness to palpation in the paraspinal muscles of her lumbar spine Neuro: alert, no obvious focal deficits   Assessment & Plan:    Back pain Assessment: 48 year old female with 6 months of bilateral lower back pain without sciatica. Plan: -Lumbar x-ray ordered -Recommended continue supportive therapy including use of heating pad, lumbar support, back stretching exercises -Informed patient I can happily refer her for physical therapy whenever she is ready/as her insurance begins.  Muscle spasm  Assessment: Patient with past medical history of lower limb muscle spasms who is noted an increase in these muscle spasms since running out of her Flexeril and increasing her exercise habits. Plan: -Patient's Flexeril refilled -Recommend physical therapy, patient request waiting to order this until her insurance kicks and    Lurline Del, [facility] PGY-1

## 2018-12-10 NOTE — Assessment & Plan Note (Signed)
Assessment: 48 year old female with 6 months of bilateral lower back pain without sciatica. Plan: -Lumbar x-ray ordered -Recommended continue supportive therapy including use of heating pad, lumbar support, back stretching exercises -Informed patient I can happily refer her for physical therapy whenever she is ready/as her insurance begins.

## 2018-12-10 NOTE — Addendum Note (Signed)
Addended by: Lurline Del on: 12/10/2018 05:27 PM   Modules accepted: Level of Service

## 2018-12-10 NOTE — Assessment & Plan Note (Signed)
Assessment: Patient with past medical history of lower limb muscle spasms who is noted an increase in these muscle spasms since running out of her Flexeril and increasing her exercise habits. Plan: -Patient's Flexeril refilled -Recommend physical therapy, patient request waiting to order this until her insurance kicks and

## 2018-12-10 NOTE — Patient Instructions (Signed)
It was great to see you!  Our plans for today:  - I'm ordering a lumbar x-ray - they should call you to schedule this - I'm reordering your flexeril  - I want you to continue to use heat on your back as well as practice some back stretches and good lumbar support in your vehicle. - I am happy to refer you for physical therapy whenever you are ready as your insurance begins.  - I want you to scale back on your ibuprofen and naproxen usage to protect your kidneys and GI system.   Back Exercises The following exercises strengthen the muscles that help to support the trunk and back. They also help to keep the lower back flexible. Doing these exercises can help to prevent back pain or lessen existing pain.  If you have back pain or discomfort, try doing these exercises 2-3 times each day or as told by your health care provider.  As your pain improves, do them once each day, but increase the number of times that you repeat the steps for each exercise (do more repetitions).  To prevent the recurrence of back pain, continue to do these exercises once each day or as told by your health care provider. Do exercises exactly as told by your health care provider and adjust them as directed. It is normal to feel mild stretching, pulling, tightness, or discomfort as you do these exercises, but you should stop right away if you feel sudden pain or your pain gets worse. Exercises Single knee to chest Repeat these steps 3-5 times for each leg: 1. Lie on your back on a firm bed or the floor with your legs extended. 2. Bring one knee to your chest. Your other leg should stay extended and in contact with the floor. 3. Hold your knee in place by grabbing your knee or thigh with both hands and hold. 4. Pull on your knee until you feel a gentle stretch in your lower back or buttocks. 5. Hold the stretch for 10-30 seconds. 6. Slowly release and straighten your leg. Pelvic tilt Repeat these steps 5-10 times: 1.  Lie on your back on a firm bed or the floor with your legs extended. 2. Bend your knees so they are pointing toward the ceiling and your feet are flat on the floor. 3. Tighten your lower abdominal muscles to press your lower back against the floor. This motion will tilt your pelvis so your tailbone points up toward the ceiling instead of pointing to your feet or the floor. 4. With gentle tension and even breathing, hold this position for 5-10 seconds. Cat-cow Repeat these steps until your lower back becomes more flexible: 1. Get into a hands-and-knees position on a firm surface. Keep your hands under your shoulders, and keep your knees under your hips. You may place padding under your knees for comfort. 2. Let your head hang down toward your chest. Contract your abdominal muscles and point your tailbone toward the floor so your lower back becomes rounded like the back of a cat. 3. Hold this position for 5 seconds. 4. Slowly lift your head, let your abdominal muscles relax and point your tailbone up toward the ceiling so your back forms a sagging arch like the back of a cow. 5. Hold this position for 5 seconds.  Press-ups Repeat these steps 5-10 times: 1. Lie on your abdomen (face-down) on the floor. 2. Place your palms near your head, about shoulder-width apart. 3. Keeping your back as relaxed  as possible and keeping your hips on the floor, slowly straighten your arms to raise the top half of your body and lift your shoulders. Do not use your back muscles to raise your upper torso. You may adjust the placement of your hands to make yourself more comfortable. 4. Hold this position for 5 seconds while you keep your back relaxed. 5. Slowly return to lying flat on the floor.  Bridges Repeat these steps 10 times: 1. Lie on your back on a firm surface. 2. Bend your knees so they are pointing toward the ceiling and your feet are flat on the floor. Your arms should be flat at your sides, next to your  body. 3. Tighten your buttocks muscles and lift your buttocks off the floor until your waist is at almost the same height as your knees. You should feel the muscles working in your buttocks and the back of your thighs. If you do not feel these muscles, slide your feet 1-2 inches farther away from your buttocks. 4. Hold this position for 3-5 seconds. 5. Slowly lower your hips to the starting position, and allow your buttocks muscles to relax completely. If this exercise is too easy, try doing it with your arms crossed over your chest. Abdominal crunches Repeat these steps 5-10 times: 1. Lie on your back on a firm bed or the floor with your legs extended. 2. Bend your knees so they are pointing toward the ceiling and your feet are flat on the floor. 3. Cross your arms over your chest. 4. Tip your chin slightly toward your chest without bending your neck. 5. Tighten your abdominal muscles and slowly raise your trunk (torso) high enough to lift your shoulder blades a tiny bit off the floor. Avoid raising your torso higher than that because it can put too much stress on your low back and does not help to strengthen your abdominal muscles. 6. Slowly return to your starting position. Back lifts Repeat these steps 5-10 times: 1. Lie on your abdomen (face-down) with your arms at your sides, and rest your forehead on the floor. 2. Tighten the muscles in your legs and your buttocks. 3. Slowly lift your chest off the floor while you keep your hips pressed to the floor. Keep the back of your head in line with the curve in your back. Your eyes should be looking at the floor. 4. Hold this position for 3-5 seconds. 5. Slowly return to your starting position. Contact a health care provider if:  Your back pain or discomfort gets much worse when you do an exercise.  Your worsening back pain or discomfort does not lessen within 2 hours after you exercise. If you have any of these problems, stop doing these  exercises right away. Do not do them again unless your health care provider says that you can. Get help right away if:  You develop sudden, severe back pain. If this happens, stop doing the exercises right away. Do not do them again unless your health care provider says that you can. This information is not intended to replace advice given to you by your health care provider. Make sure you discuss any questions you have with your health care provider. Document Released: 04/19/2004 Document Revised: 07/17/2018 Document Reviewed: 12/12/2017 Elsevier Patient Education  2020 [employer]    Take care and seek immediate care sooner if you develop any concerns.  Dr. Daymon [facility]enW[facility]

## 2019-02-23 ENCOUNTER — Other Ambulatory Visit: Payer: Self-pay | Admitting: Family Medicine

## 2019-03-23 ENCOUNTER — Telehealth: Payer: Self-pay | Admitting: Family Medicine

## 2019-03-23 ENCOUNTER — Other Ambulatory Visit: Payer: Self-pay | Admitting: Obstetrics

## 2019-03-23 ENCOUNTER — Telehealth: Payer: Self-pay

## 2019-03-23 DIAGNOSIS — N946 Dysmenorrhea, unspecified: Secondary | ICD-10-CM

## 2019-03-23 NOTE — Telephone Encounter (Signed)
Will forward to MD. Samon Dishner,CMA  

## 2019-03-23 NOTE — Telephone Encounter (Signed)
Pt is requesting Ibuprofen to be called in if possible. She does have a virtual appointment scheduled for tomorrow. jw.

## 2019-03-24 ENCOUNTER — Other Ambulatory Visit: Payer: Self-pay

## 2019-03-24 ENCOUNTER — Ambulatory Visit: Payer: Self-pay | Attending: Family Medicine

## 2019-03-24 ENCOUNTER — Encounter: Payer: Self-pay | Admitting: Family Medicine

## 2019-03-24 ENCOUNTER — Telehealth (INDEPENDENT_AMBULATORY_CARE_PROVIDER_SITE_OTHER): Payer: Self-pay | Admitting: Family Medicine

## 2019-03-24 DIAGNOSIS — J988 Other specified respiratory disorders: Secondary | ICD-10-CM

## 2019-03-24 DIAGNOSIS — B9789 Other viral agents as the cause of diseases classified elsewhere: Secondary | ICD-10-CM

## 2019-03-24 NOTE — Assessment & Plan Note (Addendum)
Mild improvement since onset, reports 1 week history of HA, congestion/cough, body aches, and generalized weakness suspicious for COVID/influenza vs other viral illness. Will send her a MyChart message with information on scheduling a Covid test, recommended continued quarantine as she is already doing.  Additionally discussed the possibility of influenza, however having this tested at this point would unlikely change management. Low concern for bacterial pneumonia given non-productive cough and otherwise unlabored respiratory status.  Recommended continued supportive care with maintaining hydration, OTC medications, tea w/ honey.  Return/ED precautions discussed including development of SOB, chest pain, or not improving in the next week.

## 2019-03-24 NOTE — Progress Notes (Signed)
[facility]  Patient consented to have virtual visit. Method of visit: Video  Encounter participants: Patient: Christina Sparks - located at Home Provider: Patriciaann Clan - located at Home  Others (if applicable): None   Chief Complaint: body aches, HA, cough   HPI: Ms. Nyce is a 48 year old female with a history of migraines, osteoarthritis, and anxiety/depression presenting via video to discuss the following:  She reports a 1 week history of frontal throbbing, headaches, body aches, nasal congestion, nonproductive cough, and generalized weakness.  She is checked her temperature once on Christmas which was 99.71F.  Also feel like her legs are on fire on occasion.  Started all of a sudden last week.  She has been drinking plenty of fluids, mainly orange juice and apple juice.  Eating, however does not have much of an appetite.  Denies any change in bowel movements, nausea, vomiting, chest pains, shortness of breath, change in taste or smell.  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  Lives with her son, boyfriend, and boyfriend's daughter is there currently as well.  They all were sick about 2 weeks ago with cough and congestion, however none were as sick as she feels.  She has been using Aleve, now ibuprofen, DayQuil, and NyQuil which helps some. Has not had flu shot.   ROS: per HPI  Pertinent PMHx: Migraines, osteoarthritis, anxiety/depression  Exam:  General: No acute distress, however does appear mildly unwell, nontoxic HEENT: Mucous membranes moist, mild nasal congestion Respiratory: Unlabored breathing, speaking in full sentences, no accessory muscle use noted.  Occasional nonproductive cough during conversation.  Assessment/Plan:  Viral respiratory illness Mild improvement since onset, reports 1 week history of HA, congestion/cough, body aches, and  generalized weakness suspicious for COVID/influenza vs other viral illness. Will send her a MyChart message with information on scheduling a Covid test, recommended continued quarantine as she is already doing.  Additionally discussed the possibility of influenza, however having this tested at this point would unlikely change management. Low concern for bacterial pneumonia given non-productive cough and otherwise unlabored respiratory status.  Recommended continued supportive care with maintaining hydration, OTC medications, tea w/ honey.  Return/ED precautions discussed including development of SOB, chest pain, or not improving in the next week.     Time spent during visit with patient: 16 minutes  Patriciaann Clan, DO  Family Medicine PGY-2

## 2019-03-29 ENCOUNTER — Encounter: Payer: Self-pay | Admitting: Family Medicine

## 2019-04-01 ENCOUNTER — Ambulatory Visit: Payer: Self-pay | Attending: Internal Medicine

## 2019-04-01 DIAGNOSIS — Z20822 Contact with and (suspected) exposure to covid-19: Secondary | ICD-10-CM | POA: Insufficient documentation

## 2019-04-03 LAB — NOVEL CORONAVIRUS, NAA: SARS-CoV-2, NAA: NOT DETECTED

## 2019-04-07 ENCOUNTER — Encounter: Payer: Self-pay | Admitting: Family Medicine

## 2019-04-09 ENCOUNTER — Ambulatory Visit (HOSPITAL_COMMUNITY)
Admission: EM | Admit: 2019-04-09 | Discharge: 2019-04-09 | Disposition: A | Discharge status: Home / Self Care | Payer: HRSA Program | Attending: Internal Medicine | Admitting: Internal Medicine

## 2019-04-09 ENCOUNTER — Encounter (HOSPITAL_COMMUNITY): Payer: Self-pay

## 2019-04-09 ENCOUNTER — Other Ambulatory Visit: Payer: Self-pay

## 2019-04-09 DIAGNOSIS — Z6839 Body mass index (BMI) 39.0-39.9, adult: Secondary | ICD-10-CM | POA: Diagnosis not present

## 2019-04-09 DIAGNOSIS — E669 Obesity, unspecified: Secondary | ICD-10-CM | POA: Insufficient documentation

## 2019-04-09 DIAGNOSIS — Z792 Long term (current) use of antibiotics: Secondary | ICD-10-CM | POA: Diagnosis not present

## 2019-04-09 DIAGNOSIS — M1711 Unilateral primary osteoarthritis, right knee: Secondary | ICD-10-CM | POA: Diagnosis not present

## 2019-04-09 DIAGNOSIS — Z20822 Contact with and (suspected) exposure to covid-19: Secondary | ICD-10-CM | POA: Diagnosis not present

## 2019-04-09 DIAGNOSIS — M62838 Other muscle spasm: Secondary | ICD-10-CM | POA: Diagnosis not present

## 2019-04-09 DIAGNOSIS — Z793 Long term (current) use of hormonal contraceptives: Secondary | ICD-10-CM | POA: Insufficient documentation

## 2019-04-09 DIAGNOSIS — Z9851 Tubal ligation status: Secondary | ICD-10-CM | POA: Diagnosis not present

## 2019-04-09 DIAGNOSIS — R52 Pain, unspecified: Secondary | ICD-10-CM | POA: Diagnosis not present

## 2019-04-09 DIAGNOSIS — J0100 Acute maxillary sinusitis, unspecified: Secondary | ICD-10-CM | POA: Insufficient documentation

## 2019-04-09 DIAGNOSIS — Z882 Allergy status to sulfonamides status: Secondary | ICD-10-CM | POA: Diagnosis not present

## 2019-04-09 DIAGNOSIS — Z791 Long term (current) use of non-steroidal anti-inflammatories (NSAID): Secondary | ICD-10-CM | POA: Diagnosis not present

## 2019-04-09 MED ORDER — FLUTICASONE PROPIONATE 50 MCG/ACT NA SUSP: 1.0000 | Freq: Every day | NASAL | 2 refills | Status: DC

## 2019-04-09 MED ORDER — BENZONATATE 100 MG PO CAPS: 100.0000 mg | ORAL_CAPSULE | Freq: Three times a day (TID) | ORAL | 0 refills | Status: DC

## 2019-04-09 MED ORDER — CETIRIZINE-PSEUDOEPHEDRINE ER 5-120 MG PO TB12: 1.0000 | ORAL_TABLET | Freq: Every day | ORAL | 0 refills | Status: DC

## 2019-04-09 MED ORDER — AMOXICILLIN-POT CLAVULANATE 875-125 MG PO TABS: 1.0000 | ORAL_TABLET | Freq: Two times a day (BID) | ORAL | 0 refills | Status: DC

## 2019-04-09 NOTE — ED Triage Notes (Signed)
Pt states her cough, body aches, congestion on 03/14/2020. States she has had a evisit on 04/01/2019, states she has stay home since & self medicated herself.

## 2019-04-09 NOTE — Discharge Instructions (Addendum)
Treating you for sinus infection Taken the medication as prescribed.  Follow up as needed for continued or worsening symptoms

## 2019-04-11 LAB — NOVEL CORONAVIRUS, NAA (HOSP ORDER, SEND-OUT TO REF LAB; TAT 18-24 HRS): SARS-CoV-2, NAA: NOT DETECTED

## 2019-04-12 NOTE — ED Provider Notes (Signed)
[facility]    CSN: [id number] Arrival date & time: 04/09/19  1035      History   Chief Complaint Chief Complaint  Patient presents with  . Cough    HPI Christina Sparks is a 49 y.o. female.   49 year old female presents today for cough, body aches, congestion, nasal congestion, sinus pressure.  Symptoms have been constant, waxing and waning since 03/14/2020.  Had an e visit 04/01/2019.Has been using OTC meds without much relief. Hx of bronchitis. No fever, chills, body aches. Tested negative for COVID. No chest pain, SOB.  ROS per HPI      Cough   Past Medical History:  Diagnosis Date  . Anemia   . Back pain   . Knee pain   . Migraines   . S/P bilateral breast reduction   . S/P tubal ligation     Patient Active Problem List   Diagnosis Date Noted  . Viral respiratory illness 03/24/2019  . Irregular menstrual cycle 06/03/2018  . Cervical stenosis of spine 09/05/2017  . Elevated BP 09/29/2015  . Healthcare maintenance 09/29/2015  . Paresthesia of hand 09/09/2014  . Cough 08/02/2014  . Acute bronchitis 07/13/2014  . Plantar fasciitis, bilateral 12/30/2013  . Back pain 02/03/2013  . Acid reflux 02/03/2013  . Unspecified constipation 02/03/2013  . Fatigue 12/10/2011  . Muscle spasm 12/04/2011  . Anxiety and depression 11/08/2011  . Osteoarthritis of right knee 06/08/2011  . Migraines 06/04/2011  . Obesity 06/04/2011  . Anemia 06/01/2011    Past Surgical History:  Procedure Laterality Date  . BREAST SURGERY     breast reduction  . CESAREAN SECTION    . TUBAL LIGATION      OB History    Gravida  3   Para  3   Term      Preterm      AB      Living        SAB      TAB      Ectopic      Multiple      Live Births               Home Medications    Prior to Admission medications   Medication Sig Start Date End Date Taking? Authorizing Provider  amoxicillin-clavulanate (AUGMENTIN) 875-125 MG tablet Take 1 tablet by  mouth every 12 (twelve) hours. 04/09/19   Dahlia Byes A, NP  benzonatate (TESSALON) 100 MG capsule Take 1 capsule (100 mg total) by mouth every 8 (eight) hours. 04/09/19   Emelio Schneller, Gloris Manchester A, NP  cetirizine-pseudoephedrine (ZYRTEC-D) 5-120 MG tablet Take 1 tablet by mouth daily. 04/09/19   Dahlia Byes A, NP  cyclobenzaprine (FLEXERIL) 10 MG tablet TAKE 1 TABLET BY MOUTH THREE TIMES A DAY AS NEEDED FOR MUSCLE SPASMS 02/24/19   Jackelyn Poling, DO  desogestrel-ethinyl estradiol (KARIVA,AZURETTE,MIRCETTE) 0.15-0.02/0.01 MG (21/5) tablet Take 1 tablet by mouth daily. 06/03/18   Tillman Sers, DO  diclofenac (VOLTAREN) 75 MG EC tablet TAKE 1 TABLET BY MOUTH TWICE A DAY 07/29/17   Riccio, Angela C, DO  fluticasone (FLONASE) 50 MCG/ACT nasal spray Place 1 spray into both nostrils daily. 04/09/19   Dalayna Lauter, Gloris Manchester A, NP  gabapentin (NEURONTIN) 100 MG capsule TAKE 1 CAPSULE BY MOUTH 3 TIMES DAILY AS NEEDED (NERVE PAIN). 06/02/18   Tillman Sers, DO  ibuprofen (ADVIL) 800 MG tablet TAKE 1 TABLET BY MOUTH EVERY 8 HOURS AS NEEDED. 03/23/19   Brock Bad,  MD    Family History Family History  Problem Relation Age of Onset  . Hypertension Mother   . Heart disease Father   . Hyperlipidemia Father     Social History Social History   Tobacco Use  . Smoking status: Never Smoker  . Smokeless tobacco: Never Used  Substance Use Topics  . Alcohol use: No    Alcohol/week: 2.0 standard drinks    Types: 2 Glasses of wine per week    Comment: occasional  . Drug use: No     Allergies   Sulfa drugs cross reactors   Review of Systems Review of Systems  Respiratory: Positive for cough.      Physical Exam Triage Vital Signs ED Triage Vitals  Enc Vitals Group     BP 04/09/19 1118 (!) 143/95     Pulse Rate 04/09/19 1118 88     Resp 04/09/19 1118 18     Temp 04/09/19 1118 98.8 F (37.1 C)     Temp Source 04/09/19 1118 Oral     SpO2 04/09/19 1118 100 %     Weight 04/09/19 1115 252 lb (114.3 kg)     Height  --      Head Circumference --      Peak Flow --      Pain Score 04/09/19 1115 8     Pain Loc --      Pain Edu? --      Excl. in GC? --    No data found.  Updated Vital Signs BP (!) 143/95 (BP Location: Left Arm)   Pulse 88   Temp 98.8 F (37.1 C) (Oral)   Resp 18   Wt 252 lb (114.3 kg)   LMP 01/28/2019   SpO2 100%   BMI 39.47 kg/m   Visual Acuity Right Eye Distance:   Left Eye Distance:   Bilateral Distance:    Right Eye Near:   Left Eye Near:    Bilateral Near:     Physical Exam Vitals and nursing note reviewed.  Constitutional:      General: She is not in acute distress.    Appearance: Normal appearance. She is not ill-appearing, toxic-appearing or diaphoretic.  HENT:     Head: Normocephalic and atraumatic.     Right Ear: Tympanic membrane and ear canal normal.     Left Ear: Tympanic membrane and ear canal normal.     Nose: Congestion present.  Eyes:     Conjunctiva/sclera: Conjunctivae normal.  Cardiovascular:     Rate and Rhythm: Normal rate and regular rhythm.     Pulses: Normal pulses.  Pulmonary:     Effort: Pulmonary effort is normal.     Breath sounds: Normal breath sounds.  Musculoskeletal:        General: Normal range of motion.  Skin:    General: Skin is warm and dry.  Neurological:     Mental Status: She is alert.  Psychiatric:        Mood and Affect: Mood normal.      UC Treatments / Results  Labs (all labs ordered are listed, but only abnormal results are displayed) Labs Reviewed  NOVEL CORONAVIRUS, NAA (HOSP ORDER, SEND-OUT TO REF LAB; TAT 18-24 HRS)    EKG   Radiology No results found.  Procedures Procedures (including critical care time)  Medications Ordered in UC Medications - No data to display  Initial Impression / Assessment and Plan / UC Course  I have reviewed the triage vital signs  and the nursing notes.  Pertinent labs & imaging results that were available during my care of the patient were reviewed by me and  considered in my medical decision making (see chart for details).     Sinusitis- Pt has been sick with symptoms since 03/15/19 treating with Augmentin, Flonase, zyrtec D Tessalon pearls of cough.  Follow up as needed for continued or worsening symptoms   Final Clinical Impressions(s) / UC Diagnoses   Final diagnoses:  Acute non-recurrent maxillary sinusitis     Discharge Instructions     Treating you for sinus infection Taken the medication as prescribed.  Follow up as needed for continued or worsening symptoms     ED Prescriptions    Medication Sig Dispense Auth. Provider   amoxicillin-clavulanate (AUGMENTIN) 875-125 MG tablet Take 1 tablet by mouth every 12 (twelve) hours. 14 tablet Aquil Duhe A, NP   benzonatate (TESSALON) 100 MG capsule Take 1 capsule (100 mg total) by mouth every 8 (eight) hours. 21 capsule Lataria Courser A, NP   fluticasone (FLONASE) 50 MCG/ACT nasal spray Place 1 spray into both nostrils daily. 16 g Germany Dodgen A, NP   cetirizine-pseudoephedrine (ZYRTEC-D) 5-120 MG tablet Take 1 tablet by mouth daily. 30 tablet Loura Halt A, NP     PDMP not reviewed this encounter.   Orvan July, NP 04/13/19 [phone]

## 2019-05-04 ENCOUNTER — Other Ambulatory Visit: Payer: Self-pay

## 2019-05-04 ENCOUNTER — Ambulatory Visit (HOSPITAL_COMMUNITY)
Admission: EM | Admit: 2019-05-04 | Discharge: 2019-05-04 | Disposition: A | Discharge status: Home / Self Care | Payer: Self-pay | Attending: Physician Assistant | Admitting: Physician Assistant

## 2019-05-04 ENCOUNTER — Encounter (HOSPITAL_COMMUNITY): Payer: Self-pay

## 2019-05-04 ENCOUNTER — Ambulatory Visit (INDEPENDENT_AMBULATORY_CARE_PROVIDER_SITE_OTHER): Payer: Self-pay

## 2019-05-04 DIAGNOSIS — R5383 Other fatigue: Secondary | ICD-10-CM

## 2019-05-04 DIAGNOSIS — J0121 Acute recurrent ethmoidal sinusitis: Secondary | ICD-10-CM

## 2019-05-04 DIAGNOSIS — M7918 Myalgia, other site: Secondary | ICD-10-CM

## 2019-05-04 MED ORDER — PREDNISONE 10 MG PO TABS: ORAL_TABLET | ORAL | 0 refills | Status: DC

## 2019-05-04 NOTE — ED Provider Notes (Signed)
[place]    CSN: [id number] Arrival date & time: 05/04/19  1637      History   Chief Complaint Chief Complaint  Patient presents with  . Cough    HPI Christina Sparks is a 49 y.o. female.   The history is provided by the patient. No language interpreter was used.  Cough Cough characteristics:  Productive Sputum characteristics:  Nondescript Severity:  Moderate Onset quality:  Gradual Duration:  5 weeks Progression:  Worsening Smoker: no   Context: not sick contacts   Relieved by:  Nothing Worsened by:  Nothing Ineffective treatments:  Cough suppressants Risk factors: no recent infection   Pt reports she has had a cough for 5 weeks.  Pt was treated with augmentin for sinus infection.  Pt reports she was better for a few days. Pt reports she is concerned that she has pneumonia   Past Medical History:  Diagnosis Date  . Anemia   . Back pain   . Knee pain   . Migraines   . S/P bilateral breast reduction   . S/P tubal ligation     Patient Active Problem List   Diagnosis Date Noted  . Viral respiratory illness 03/24/2019  . Irregular menstrual cycle 06/03/2018  . Cervical stenosis of spine 09/05/2017  . Elevated BP 09/29/2015  . Healthcare maintenance 09/29/2015  . Paresthesia of hand 09/09/2014  . Cough 08/02/2014  . Acute bronchitis 07/13/2014  . Plantar fasciitis, bilateral 12/30/2013  . Back pain 02/03/2013  . Acid reflux 02/03/2013  . Unspecified constipation 02/03/2013  . Fatigue 12/10/2011  . Muscle spasm 12/04/2011  . Anxiety and depression 11/08/2011  . Osteoarthritis of right knee 06/08/2011  . Migraines 06/04/2011  . Obesity 06/04/2011  . Anemia 06/01/2011    Past Surgical History:  Procedure Laterality Date  . BREAST SURGERY     breast reduction  . CESAREAN SECTION    . TUBAL LIGATION      OB History    Gravida  3   Para  3   Term      Preterm      AB      Living        SAB      TAB      Ectopic      Multiple      Live Births               Home Medications    Prior to Admission medications   Medication Sig Start Date End Date Taking? Authorizing Provider  amoxicillin-clavulanate (AUGMENTIN) 875-125 MG tablet Take 1 tablet by mouth every 12 (twelve) hours. 04/09/19   Loura Halt A, NP  benzonatate (TESSALON) 100 MG capsule Take 1 capsule (100 mg total) by mouth every 8 (eight) hours. 04/09/19   Bast, Tressia Miners A, NP  cetirizine-pseudoephedrine (ZYRTEC-D) 5-120 MG tablet Take 1 tablet by mouth daily. 04/09/19   Loura Halt A, NP  cyclobenzaprine (FLEXERIL) 10 MG tablet TAKE 1 TABLET BY MOUTH THREE TIMES A DAY AS NEEDED FOR MUSCLE SPASMS 02/24/19   Lurline Del, DO  desogestrel-ethinyl estradiol (KARIVA,AZURETTE,MIRCETTE) 0.15-0.02/0.01 MG (21/5) tablet Take 1 tablet by mouth daily. 06/03/18   Steve Rattler, DO  diclofenac (VOLTAREN) 75 MG EC tablet TAKE 1 TABLET BY MOUTH TWICE A DAY 07/29/17   Riccio, Angela C, DO  fluticasone (FLONASE) 50 MCG/ACT nasal spray Place 1 spray into both nostrils daily. 04/09/19   Loura Halt A, NP  gabapentin (NEURONTIN) 100 MG capsule  TAKE 1 CAPSULE BY MOUTH 3 TIMES DAILY AS NEEDED (NERVE PAIN). 06/02/18   Tillman Sers, DO  ibuprofen (ADVIL) 800 MG tablet TAKE 1 TABLET BY MOUTH EVERY 8 HOURS AS NEEDED. 03/23/19   Brock Bad, MD  predniSONE (DELTASONE) 10 MG tablet 6,5,4,3,2,1 05/04/19   Elson Areas, PA-C    Family History Family History  Problem Relation Age of Onset  . Hypertension Mother   . Heart disease Father   . Hyperlipidemia Father     Social History Social History   Tobacco Use  . Smoking status: Never Smoker  . Smokeless tobacco: Never Used  Substance Use Topics  . Alcohol use: No    Alcohol/week: 2.0 standard drinks    Types: 2 Glasses of wine per week    Comment: occasional  . Drug use: No     Allergies   Sulfa drugs cross reactors   Review of Systems Review of Systems  Respiratory: Positive for cough.   All other  systems reviewed and are negative.    Physical Exam Triage Vital Signs ED Triage Vitals  Enc Vitals Group     BP 05/04/19 1819 (!) 157/111     Pulse Rate 05/04/19 1819 73     Resp 05/04/19 1819 18     Temp 05/04/19 1819 98.4 F (36.9 C)     Temp Source 05/04/19 1819 Oral     SpO2 05/04/19 1819 99 %     Weight 05/04/19 1818 254 lb 12.8 oz (115.6 kg)     Height --      Head Circumference --      Peak Flow --      Pain Score 05/04/19 1818 0     Pain Loc --      Pain Edu? --      Excl. in GC? --    No data found.  Updated Vital Signs BP (!) 157/111 (BP Location: Right Arm)   Pulse 73   Temp 98.4 F (36.9 C) (Oral)   Resp 18   Wt 115.6 kg   LMP 02/12/2018 Comment: tubleligation 15 years ago  SpO2 99%   BMI 39.91 kg/m   Visual Acuity Right Eye Distance:   Left Eye Distance:   Bilateral Distance:    Right Eye Near:   Left Eye Near:    Bilateral Near:     Physical Exam Vitals and nursing note reviewed.  Constitutional:      Appearance: She is well-developed.  HENT:     Head: Normocephalic.  Cardiovascular:     Rate and Rhythm: Normal rate and regular rhythm.  Pulmonary:     Effort: Pulmonary effort is normal.  Abdominal:     General: There is no distension.  Musculoskeletal:        General: Normal range of motion.     Cervical back: Normal range of motion.  Neurological:     Mental Status: She is alert and oriented to person, place, and time.      UC Treatments / Results  Labs (all labs ordered are listed, but only abnormal results are displayed) Labs Reviewed - No data to display  EKG   Radiology DG Chest 2 View  Result Date: 05/04/2019 CLINICAL DATA:  Fatigue, body aches EXAM: CHEST - 2 VIEW COMPARISON:  06/30/2014 FINDINGS: The heart size and mediastinal contours are within normal limits. Both lungs are clear. The visualized skeletal structures are unremarkable. IMPRESSION: No active cardiopulmonary disease. Electronically Signed   By: Casimiro Needle  Manson Passey M.D.   On: 05/04/2019 19:02    Procedures Procedures (including critical care time)  Medications Ordered in UC Medications - No data to display  Initial Impression / Assessment and Plan / UC Course  I have reviewed the triage vital signs and the nursing notes.  Pertinent labs & imaging results that were available during my care of the patient were reviewed by me and considered in my medical decision making (see chart for details).     MDM:  Chest xray is negative.  Pt given rx for prednisone taper.  Pt advised to follow up with Family practice for recheck if not resolved Final Clinical Impressions(s) / UC Diagnoses   Final diagnoses:  Acute recurrent ethmoidal sinusitis   Discharge Instructions   None    ED Prescriptions    Medication Sig Dispense Auth. Provider   predniSONE (DELTASONE) 10 MG tablet 6,5,4,3,2,1 21 tablet Elson Areas, [place]     PDMP not reviewed this encounter.  An After Visit Summary was printed and given to the patient.    Elson Areas, [place] 05/04/19 2029

## 2019-05-04 NOTE — ED Triage Notes (Signed)
Pt is here with a cough even after her last visit on 04/09/2019, states she can taste now that is her only improvement.

## 2019-05-26 ENCOUNTER — Telehealth (INDEPENDENT_AMBULATORY_CARE_PROVIDER_SITE_OTHER): Payer: Self-pay | Admitting: Family Medicine

## 2019-05-26 ENCOUNTER — Other Ambulatory Visit: Payer: Self-pay

## 2019-05-26 DIAGNOSIS — R05 Cough: Secondary | ICD-10-CM

## 2019-05-26 DIAGNOSIS — R059 Cough, unspecified: Secondary | ICD-10-CM

## 2019-05-26 MED ORDER — AMOXICILLIN-POT CLAVULANATE 875-125 MG PO TABS: 1.0000 | ORAL_TABLET | Freq: Two times a day (BID) | ORAL | 0 refills | Status: DC

## 2019-05-26 MED ORDER — CETIRIZINE HCL 10 MG PO TABS: 10.0000 mg | ORAL_TABLET | Freq: Every day | ORAL | 1 refills | Status: DC

## 2019-05-26 MED ORDER — ALBUTEROL SULFATE HFA 108 (90 BASE) MCG/ACT IN AERS: 2.0000 | INHALATION_SPRAY | RESPIRATORY_TRACT | 0 refills | Status: DC | PRN

## 2019-05-26 MED ORDER — FLUTICASONE PROPIONATE 50 MCG/ACT NA SUSP: 2.0000 | Freq: Every day | NASAL | 1 refills | Status: DC

## 2019-05-26 NOTE — Progress Notes (Signed)
[place] [facility] Telemedicine Visit  Patient consented to have virtual visit. Method of visit: Video  Encounter participants: Patient: Christina Sparks - located at home Provider: Levert Feinstein - located at office Others (if applicable): n/a  Chief Complaint: cough/congestion  HPI:  Patient seen via telemedicine for cough and congestion follow up.  On December 18 began having cold symptoms, including cough, congestion, flu like symptoms and loss of taste/smell.   12/29 did a telemedicine visit and did driveup COVID testing, which was negative.   1/14 seen at [facility] and got rx for Augmentin. This helped her symptoms some, but they didn't fully go away. Also had repeat COVID test, which was again negative.   2/8 went back to UC and was given an rx for prednisone which didn't significant improve symptoms.   Records reviewed from all of these visits and tests.  Since that time symptoms have continued. Has not had a fever. Continues to have a dry frequent, irritating cough. Has burning in throat and chest with cough. Also has significant nasal congestion. Has been using mucinex severe sinus, as well as frequently using afrin (for well over 3 days in a row).  ROS: per HPI  Pertinent PMHx: history of anemia, migraines, obesity, anxiety/depression  Exam:  Gen: no acute distress, pleasant, cooperative HEENT: vocal quality suggests nasal congestion Respiratory: speaks in full sentences without distress. Frequent dry cough. Normal work of breathing without audible wheezing Neuro: speech normal, grossly nonfocal  Assessment/Plan:  Cough and congestion Given duration of symptoms, less likely viral in etiology Differential diagnosis includes allergic sinusitis vs chronic bacterial sinusitis. Suspect allergies, though patient prefers to treat for both, as she is desperate to feel better as soon as possible. It is interesting that she did have some improvement after  initial course of augmentin x7 days. Plan: - rx augmentin 10 day course - trial of albuterol inhaler in case bronchospasm is contributing - zyrtec and flonase daily - advised to stop afrin, educated on rhinitis medicamentosa  Follow up if this does not help.  Time spent during visit with patient: 15 minutes

## 2019-06-01 ENCOUNTER — Telehealth: Payer: Self-pay

## 2019-06-01 ENCOUNTER — Telehealth (INDEPENDENT_AMBULATORY_CARE_PROVIDER_SITE_OTHER): Payer: Self-pay | Admitting: Family Medicine

## 2019-06-01 DIAGNOSIS — R059 Cough, unspecified: Secondary | ICD-10-CM

## 2019-06-01 DIAGNOSIS — R05 Cough: Secondary | ICD-10-CM

## 2019-06-01 MED ORDER — ALBUTEROL SULFATE HFA 108 (90 BASE) MCG/ACT IN AERS: 2.0000 | INHALATION_SPRAY | RESPIRATORY_TRACT | 0 refills | Status: DC | PRN

## 2019-06-01 MED ORDER — ATROVENT HFA 17 MCG/ACT IN AERS: 2.0000 | INHALATION_SPRAY | Freq: Four times a day (QID) | RESPIRATORY_TRACT | 1 refills | Status: DC

## 2019-06-01 MED ORDER — CLARITIN-D 24 HOUR 10-240 MG PO TB24: 1.0000 | ORAL_TABLET | Freq: Every day | ORAL | 0 refills | Status: DC

## 2019-06-01 NOTE — Progress Notes (Signed)
[facility] Telemedicine Visit I connected with  SHUNTA MCLAURIN on 06/01/19 by a video enabled telemedicine application and verified that I am speaking with the correct person using two identifiers.   I discussed the limitations of evaluation and management by telemedicine. The patient expressed understanding and agreed to proceed.  Patient consented to have virtual visit. Method of visit: Video  Encounter participants: Patient: [name] - located outdoors  Provider: Oralia Manis - located at [facility] Others (if applicable): None  Chief Complaint: cough  HPI:  Cough/congestion Patient recently seen on 3/2 by Dr. Pollie Meyer via telemedicine visit.  That was for cough and congestion.  Patient has been having cold-like symptoms since December 18.  Had a Covid test on 12/29 which was negative.  Received Augmentin on 1/14 from urgent care which helped some.  On 2/8 received prednisone from the urgent care which did not significantly improve her symptoms.  From visit on 3/2 etiology of cough thought to be allergic versus bacterial.  Patient given Augmentin 10-day course as well as trial of albuterol inhaler.  Advised Zyrtec and Flonase daily.  Patient reports she has been dealing with cough since December. Patient was placed on antibiotics but continues to have cough. Still no relief. Started to cough up mucous some. Has taken "so many medicines and no relief". Notes congestion. Patient did get CXR on 2/8, negative. Can hear herself "wheezing" in her sleep. Wakes up throughout the night coughing. Chest hurts after significant coughing. Tried albuterol inhaler which helped some, does note she broke her inhaler by accident. Denies symptoms of acid reflux. Did have 1 episode of emesis after coughing spell. No fevers. No chills. Does reports some intermittent body aches after significant coughing. Some sore throat after coughing spells.   ROS: per HPI  Pertinent PMHx: cough,  obesity   Exam:  Respiratory: speaking full sentences, no increased WOB, coughing   Assessment/Plan:  Cough Differentials include: bacterial sinusitis, viral, allergic, GERD. Of note patient not on ACEI. CXR wnl in February. Unlikely bacterial sinusitis given that patient has failed augmentin. Less likely allergic given no improvement with flonase or zyrtec. Patient denies GERD symptoms. Given that patient likely has prolonged viral cough will give trial of inhaled atrovent as well as antihistamine-decongestant combination. Advised that decongestant would make patient tired so she should avoid driving and should take before bed. Advised f/u appointment on Friday to ensure improvement.    Discussed patient with Dr. McDiarmid  Time spent during visit with patient: 10 minutes

## 2019-06-01 NOTE — Telephone Encounter (Signed)
Patient calls nurse line stating the inhaler called in for her is ~450 dollars. I called the patients pharmacy to get cheaper recommendations. The pharmacist mentioned Flovent, however does not know what she is being treated for. Will forward to provider who saw patient.

## 2019-06-01 NOTE — Assessment & Plan Note (Signed)
Differentials include: bacterial sinusitis, viral, allergic, GERD. Of note patient not on ACEI. CXR wnl in February. Unlikely bacterial sinusitis given that patient has failed augmentin. Less likely allergic given no improvement with flonase or zyrtec. Patient denies GERD symptoms. Given that patient likely has prolonged viral cough will give trial of inhaled atrovent as well as antihistamine-decongestant combination. Advised that decongestant would make patient tired so she should avoid driving and should take before bed. Advised f/u appointment on Friday to ensure improvement.

## 2019-06-01 NOTE — Telephone Encounter (Signed)
Pt is calling again to follow up on new script, since the other is like 450 dollars. Please advise pt.! Thanks

## 2019-06-02 ENCOUNTER — Encounter: Payer: Self-pay | Admitting: Family Medicine

## 2019-06-02 ENCOUNTER — Other Ambulatory Visit: Payer: Self-pay | Admitting: Family Medicine

## 2019-06-02 MED ORDER — IPRATROPIUM BROMIDE 0.03 % NA SOLN: 2.0000 | Freq: Two times a day (BID) | NASAL | 0 refills | Status: DC

## 2019-06-02 NOTE — Telephone Encounter (Signed)
I have switched from atrovent inhaler to ipratropium nasal spray. Per good rx this should be ~$20. I precepted with Dr. Manson Passey to ensure this is an adequate replacement for treatment.   Orpah Clinton, PGY-3 [facility] 06/02/2019 8:43 AM

## 2019-06-04 NOTE — Progress Notes (Signed)
[facility] Telemedicine Visit I connected with  Christina Sparks on 06/05/19 by a video enabled telemedicine application and verified that I am speaking with the correct person using two identifiers.   I discussed the limitations of evaluation and management by telemedicine. The patient expressed understanding and agreed to proceed.   Patient consented to have virtual visit. Method of visit: Video was attempted, but technology challenges prevented patient from using video, so visit was conducted via telephone.  Encounter participants: Patient: Christina Sparks - located at Home Provider: Oralia Manis - located at [facility] Others (if applicable): none  Chief Complaint: cough  HPI: Cough Recent virtual visit on 3/8.  At that time cough thought to be likely prolonged viral cough.  Advised to use Atrovent as well as a decongestant.  Patient reports she is not having any improvement. Using medications daily. Reports hoarse voice now 2/2 coughing. Continues to have wheezing. Reports no rest at night 2/2 cough. Taking the antibiotics, allergy medications, atrovent, and decongestants. States she always sounds stuffy and congested.   ROS: per HPI  Pertinent PMHx: obesity   Exam:  Respiratory: speaking full sentences  Assessment/Plan:  Cough Patient with continued cough despite treatment with multiple agents. Patient with negative CXR in February. Has failed antibiotics. Has failed allergic treatment with flonase and antihistamines. Has no GERD symptoms. Not on ACEI. Failed atrovent and decongestant. Given that patient has continued cough with wheezing will plan to refer to pulmonology. Patient may need PFTs or other further w/u. Patient was unable to receive her albuterol inhaler refill from pharmacy, repeat refill sent. Strict return precautions given. Advised to call after hours emergency line if patient has any worsening symptoms over the weekend.     Time spent  during visit with patient: 15 minutes

## 2019-06-05 ENCOUNTER — Other Ambulatory Visit: Payer: Self-pay

## 2019-06-05 ENCOUNTER — Encounter: Payer: Self-pay | Admitting: Family Medicine

## 2019-06-05 ENCOUNTER — Telehealth (INDEPENDENT_AMBULATORY_CARE_PROVIDER_SITE_OTHER): Payer: Self-pay | Admitting: Family Medicine

## 2019-06-05 VITALS — Wt 252.0 lb

## 2019-06-05 DIAGNOSIS — R05 Cough: Secondary | ICD-10-CM

## 2019-06-05 DIAGNOSIS — R062 Wheezing: Secondary | ICD-10-CM

## 2019-06-05 DIAGNOSIS — R0981 Nasal congestion: Secondary | ICD-10-CM

## 2019-06-05 DIAGNOSIS — R059 Cough, unspecified: Secondary | ICD-10-CM

## 2019-06-05 MED ORDER — ALBUTEROL SULFATE HFA 108 (90 BASE) MCG/ACT IN AERS: 2.0000 | INHALATION_SPRAY | RESPIRATORY_TRACT | 2 refills | Status: DC | PRN

## 2019-06-05 NOTE — Assessment & Plan Note (Signed)
Patient with continued cough despite treatment with multiple agents. Patient with negative CXR in February. Has failed antibiotics. Has failed allergic treatment with flonase and antihistamines. Has no GERD symptoms. Not on ACEI. Failed atrovent and decongestant. Given that patient has continued cough with wheezing will plan to refer to pulmonology. Patient may need PFTs or other further w/u. Patient was unable to receive her albuterol inhaler refill from pharmacy, repeat refill sent. Strict return precautions given. Advised to call after hours emergency line if patient has any worsening symptoms over the weekend.

## 2019-06-13 ENCOUNTER — Emergency Department (HOSPITAL_COMMUNITY)
Admission: EM | Admit: 2019-06-13 | Discharge: 2019-06-14 | Disposition: A | Discharge status: Home / Self Care | Payer: Self-pay | Attending: Emergency Medicine | Admitting: Emergency Medicine

## 2019-06-13 ENCOUNTER — Other Ambulatory Visit: Payer: Self-pay

## 2019-06-13 ENCOUNTER — Emergency Department (HOSPITAL_COMMUNITY): Payer: Self-pay

## 2019-06-13 ENCOUNTER — Encounter (HOSPITAL_COMMUNITY): Payer: Self-pay | Admitting: Emergency Medicine

## 2019-06-13 DIAGNOSIS — R05 Cough: Secondary | ICD-10-CM | POA: Insufficient documentation

## 2019-06-13 DIAGNOSIS — K8020 Calculus of gallbladder without cholecystitis without obstruction: Secondary | ICD-10-CM | POA: Insufficient documentation

## 2019-06-13 DIAGNOSIS — Z79899 Other long term (current) drug therapy: Secondary | ICD-10-CM | POA: Insufficient documentation

## 2019-06-13 DIAGNOSIS — R059 Cough, unspecified: Secondary | ICD-10-CM

## 2019-06-13 LAB — COMPREHENSIVE METABOLIC PANEL
ALT: 20 U/L (ref 0–44)
AST: 32 U/L (ref 15–41)
Albumin: 3.5 g/dL (ref 3.5–5.0)
Alkaline Phosphatase: 71 U/L (ref 38–126)
Anion gap: 7 (ref 5–15)
BUN: 5 mg/dL — ABNORMAL LOW (ref 6–20)
CO2: 26 mmol/L (ref 22–32)
Calcium: 8.6 mg/dL — ABNORMAL LOW (ref 8.9–10.3)
Chloride: 108 mmol/L (ref 98–111)
Creatinine, Ser: 0.68 mg/dL (ref 0.44–1.00)
GFR calc Af Amer: >60 mL/min (ref >60)
GFR calc non Af Amer: >60 mL/min (ref >60)
Glucose, Bld: 121 mg/dL — ABNORMAL HIGH (ref 70–99)
Potassium: 3.8 mmol/L (ref 3.5–5.1)
Sodium: 141 mmol/L (ref 135–145)
Total Bilirubin: 0.2 mg/dL — ABNORMAL LOW (ref 0.3–1.2)
Total Protein: 6.9 g/dL (ref 6.5–8.1)

## 2019-06-13 LAB — CBC WITH DIFFERENTIAL/PLATELET
Abs Immature Granulocytes: 0.04 10*3/uL (ref 0.00–0.07)
Basophils Absolute: 0.1 10*3/uL (ref 0.0–0.1)
Basophils Relative: 1 %
Eosinophils Absolute: 1.9 10*3/uL — ABNORMAL HIGH (ref 0.0–0.5)
Eosinophils Relative: 18 %
HCT: 35.1 % — ABNORMAL LOW (ref 36.0–46.0)
Hemoglobin: 10.5 g/dL — ABNORMAL LOW (ref 12.0–15.0)
Immature Granulocytes: 0 %
Lymphocytes Relative: 38 %
Lymphs Abs: 4.2 10*3/uL — ABNORMAL HIGH (ref 0.7–4.0)
MCH: 21.6 pg — ABNORMAL LOW (ref 26.0–34.0)
MCHC: 29.9 g/dL — ABNORMAL LOW (ref 30.0–36.0)
MCV: 72.4 fL — ABNORMAL LOW (ref 80.0–100.0)
Monocytes Absolute: 1 10*3/uL (ref 0.1–1.0)
Monocytes Relative: 9 %
Neutro Abs: 3.8 10*3/uL (ref 1.7–7.7)
Neutrophils Relative %: 34 %
Platelets: 350 10*3/uL (ref 150–400)
RBC: 4.85 MIL/uL (ref 3.87–5.11)
RDW: 19 % — ABNORMAL HIGH (ref 11.5–15.5)
WBC: 11 10*3/uL — ABNORMAL HIGH (ref 4.0–10.5)
nRBC: 0 % (ref 0.0–0.2)

## 2019-06-13 LAB — I-STAT BETA HCG BLOOD, ED (MC, WL, AP ONLY): I-stat hCG, quantitative: <5 m[IU]/mL (ref <5)

## 2019-06-13 NOTE — ED Triage Notes (Signed)
Patient reports chronic productive cough since December last year , denies fever or chills/ respirations unlabored.

## 2019-06-14 ENCOUNTER — Emergency Department (HOSPITAL_COMMUNITY): Payer: Self-pay

## 2019-06-14 MED ORDER — DOXYCYCLINE HYCLATE 100 MG PO CAPS: 100.0000 mg | ORAL_CAPSULE | Freq: Two times a day (BID) | ORAL | 0 refills | Status: DC

## 2019-06-14 MED ORDER — PREDNISONE 20 MG PO TABS: 40.0000 mg | ORAL_TABLET | Freq: Every day | ORAL | 0 refills | Status: DC

## 2019-06-14 MED ORDER — ALBUTEROL SULFATE HFA 108 (90 BASE) MCG/ACT IN AERS
2.0000 | INHALATION_SPRAY | RESPIRATORY_TRACT | Status: DC | PRN
  Administered 2019-06-14: 2 via RESPIRATORY_TRACT
  Filled 2019-06-14: qty 6.7

## 2019-06-14 MED ORDER — METHYLPREDNISOLONE SODIUM SUCC 125 MG IJ SOLR
125.0000 mg | Freq: Once | INTRAMUSCULAR | Status: AC
  Administered 2019-06-14: 125 mg via INTRAVENOUS
  Filled 2019-06-14: qty 2

## 2019-06-14 MED ORDER — IPRATROPIUM-ALBUTEROL 0.5-2.5 (3) MG/3ML IN SOLN
3.0000 mL | Freq: Once | RESPIRATORY_TRACT | Status: AC
  Administered 2019-06-14: 3 mL via RESPIRATORY_TRACT
  Filled 2019-06-14: qty 3

## 2019-06-14 MED ORDER — IOHEXOL 350 MG/ML SOLN
100.0000 mL | Freq: Once | INTRAVENOUS | Status: AC | PRN
  Administered 2019-06-14: 100 mL via INTRAVENOUS

## 2019-06-14 MED ORDER — DOXYCYCLINE HYCLATE 100 MG PO TABS
100.0000 mg | ORAL_TABLET | Freq: Once | ORAL | Status: AC
  Administered 2019-06-14: 100 mg via ORAL
  Filled 2019-06-14: qty 1

## 2019-06-14 NOTE — ED Notes (Signed)
Pt transported to CT ?

## 2019-06-14 NOTE — ED Notes (Signed)
Discharge instructions discussed with pt. Pt verbalized understanding with no questions at this time. Pt to go home with significant other  

## 2019-06-14 NOTE — ED Provider Notes (Signed)
[facility] EMERGENCY DEPARTMENT Provider Note   CSN: [id number] Arrival date & time: 06/13/19  2210     History Chief Complaint  Patient presents with  . Cough    Christina Sparks is a 49 y.o. female.  Patient presents to the emergency department with a chief complaint of cough.  She states that she has had a cough since December.  She reports associated wheezing.  She states that the cough is productive.  She has been dealing with the symptoms fairly persistently since they began back in December.  She denies any fevers.  She has been tested for Covid, but tested negative.  She has been seen by numerous providers for the same and has been treated with inhalers, antibiotics, steroids, but has not had any relief of her symptoms.  She did see her primary care doctor earlier this week, and was given a referral to pulmonology.  She has not heard back from pulmonology yet.  She denies any history of asthma or COPD.  She is not a smoker.  Today she is concerned because she has had some hemoptysis.    The history is provided by the patient. No language interpreter was used.       Past Medical History:  Diagnosis Date  . Anemia   . Back pain   . Knee pain   . Migraines   . S/P bilateral breast reduction   . S/P tubal ligation     Patient Active Problem List   Diagnosis Date Noted  . Viral respiratory illness 03/24/2019  . Irregular menstrual cycle 06/03/2018  . Cervical stenosis of spine 09/05/2017  . Elevated BP 09/29/2015  . Healthcare maintenance 09/29/2015  . Paresthesia of hand 09/09/2014  . Cough 08/02/2014  . Acute bronchitis 07/13/2014  . Plantar fasciitis, bilateral 12/30/2013  . Back pain 02/03/2013  . Acid reflux 02/03/2013  . Unspecified constipation 02/03/2013  . Fatigue 12/10/2011  . Muscle spasm 12/04/2011  . Anxiety and depression 11/08/2011  . Osteoarthritis of right knee 06/08/2011  . Migraines 06/04/2011  . Obesity 06/04/2011  . Anemia  06/01/2011    Past Surgical History:  Procedure Laterality Date  . BREAST SURGERY     breast reduction  . CESAREAN SECTION    . TUBAL LIGATION       OB History    Gravida  3   Para  3   Term      Preterm      AB      Living        SAB      TAB      Ectopic      Multiple      Live Births              Family History  Problem Relation Age of Onset  . Hypertension Mother   . Heart disease Father   . Hyperlipidemia Father     Social History   Tobacco Use  . Smoking status: Never Smoker  . Smokeless tobacco: Never Used  Substance Use Topics  . Alcohol use: No    Alcohol/week: 2.0 standard drinks    Types: 2 Glasses of wine per week    Comment: occasional  . Drug use: No    Home Medications Prior to Admission medications   Medication Sig Start Date End Date Taking? Authorizing Provider  albuterol (VENTOLIN HFA) 108 (90 Base) MCG/ACT inhaler Inhale 2 puffs into the lungs every 4 (four) hours as  needed for wheezing or shortness of breath. 06/05/19   Oralia Manis, DO  amoxicillin-clavulanate (AUGMENTIN) 875-125 MG tablet Take 1 tablet by mouth 2 (two) times daily. 05/26/19   Latrelle Dodrill, MD  cetirizine (ZYRTEC) 10 MG tablet Take 1 tablet (10 mg total) by mouth daily. 05/26/19   Latrelle Dodrill, MD  fluticasone (FLONASE) 50 MCG/ACT nasal spray Place 2 sprays into both nostrils daily. 05/26/19   Latrelle Dodrill, MD  gabapentin (NEURONTIN) 100 MG capsule TAKE 1 CAPSULE BY MOUTH 3 TIMES DAILY AS NEEDED (NERVE PAIN). 06/02/18   Tillman Sers, DO  ibuprofen (ADVIL) 800 MG tablet TAKE 1 TABLET BY MOUTH EVERY 8 HOURS AS NEEDED. 03/23/19   Brock Bad, MD  ipratropium (ATROVENT) 0.03 % nasal spray Place 2 sprays into both nostrils every 12 (twelve) hours. 06/02/19   Oralia Manis, DO  loratadine-pseudoephedrine (CLARITIN-D 24 HOUR) 10-240 MG 24 hr tablet Take 1 tablet by mouth daily. 06/01/19   Oralia Manis, DO    Allergies    Sulfa drugs  cross reactors  Review of Systems   Review of Systems  All other systems reviewed and are negative.   Physical Exam Updated Vital Signs BP (!) 166/153   Pulse (!) 102   Temp 97.9 F (36.6 C) (Oral)   Resp (!) 25   Ht 5\' 7"  (1.702 m)   Wt 120 kg   LMP 01/28/2019   SpO2 100%   BMI 41.43 kg/m   Physical Exam Vitals and nursing note reviewed.  Constitutional:      General: She is not in acute distress.    Appearance: She is well-developed.  HENT:     Head: Normocephalic and atraumatic.     Mouth/Throat:     Comments: Hoarse, mild oropharyngeal erythema, but no edema or abscess, no stridor Eyes:     Conjunctiva/sclera: Conjunctivae normal.  Cardiovascular:     Rate and Rhythm: Regular rhythm. Tachycardia present.     Heart sounds: No murmur.  Pulmonary:     Effort: Pulmonary effort is normal. No respiratory distress.     Breath sounds: Normal breath sounds.     Comments: Coughing, wheezing Abdominal:     Palpations: Abdomen is soft.     Tenderness: There is no abdominal tenderness.  Musculoskeletal:     Cervical back: Neck supple.  Skin:    General: Skin is warm and dry.  Neurological:     Mental Status: She is alert.  Psychiatric:        Mood and Affect: Mood normal.        Behavior: Behavior normal.     ED Results / Procedures / Treatments   Labs (all labs ordered are listed, but only abnormal results are displayed) Labs Reviewed  CBC WITH DIFFERENTIAL/PLATELET - Abnormal; Notable for the following components:      Result Value   WBC 11.0 (*)    Hemoglobin 10.5 (*)    HCT 35.1 (*)    MCV 72.4 (*)    MCH 21.6 (*)    MCHC 29.9 (*)    RDW 19.0 (*)    Lymphs Abs 4.2 (*)    Eosinophils Absolute 1.9 (*)    All other components within normal limits  COMPREHENSIVE METABOLIC PANEL - Abnormal; Notable for the following components:   Glucose, Bld 121 (*)    BUN 5 (*)    Calcium 8.6 (*)    Total Bilirubin 0.2 (*)    All other components within normal  limits   I-STAT BETA HCG BLOOD, ED ([facility], [facility], [facility] ONLY)    EKG None  Radiology DG Chest 2 View  Result Date: 06/13/2019 CLINICAL DATA:  Cough EXAM: CHEST - 2 VIEW COMPARISON:  05/04/2019 FINDINGS: The heart size and mediastinal contours are within normal limits. Both lungs are clear. No pleural effusion. The visualized skeletal structures are unremarkable. IMPRESSION: No acute process in the chest. Electronically Signed   By: Macy Mis M.D.   On: 06/13/2019 22:49    Procedures Procedures (including critical care time)  Medications Ordered in ED Medications  ipratropium-albuterol (DUONEB) 0.5-2.5 (3) MG/3ML nebulizer solution 3 mL (has no administration in time range)  methylPREDNISolone sodium succinate (SOLU-MEDROL) 125 mg/2 mL injection 125 mg (has no administration in time range)    ED Course  I have reviewed the triage vital signs and the nursing notes.  Pertinent labs & imaging results that were available during my care of the patient were reviewed by me and considered in my medical decision making (see chart for details).    MDM Rules/Calculators/A&P                       Patient with cough and wheezing x4 months.  She has been seen by numerous providers and has not had any relief with allergy medication, antibiotics, steroids, inhalers.  She has referral to pulmonology, but has not seen them yet.  She states that today she had some hemoptysis, this prompted her to come to the emergency department.  Chest x-ray is negative.  She has mild leukocytosis of 11.0.  Will check CT PE to see if there is any evidence of clot or occult pneumonia.  Will treat with albuterol and Solu-Medrol. I do NOT suspect COVID given the persistent nature of the symptoms.  CT scan shows no PE.  There are few patchy areas of subpleural opacity which could be acute infection or inflammation.  We will treat with doxycycline, give prednisone, and refill inhaler.  Recommend follow-up with  pulmonology.   Final Clinical Impression(s) / ED Diagnoses Final diagnoses:  Cough  Cholelithiasis without cholecystitis    Rx / DC Orders ED Discharge Orders         Ordered    doxycycline (VIBRAMYCIN) 100 MG capsule  2 times daily     06/14/19 0455    predniSONE (DELTASONE) 20 MG tablet  Daily     06/14/19 0455           Montine Circle, PA-C 09/16/74 2831    Delora Fuel, MD 51/76/16 [phone]

## 2019-06-14 NOTE — Discharge Instructions (Signed)
Your CT scan today showed some evidence of either developing infection or inflammation.  Given the change in sputum production, we would like to cover you for developing infection with doxycycline.  Please also take the steroids and use the inhaler as directed.  It is imperative that you follow-up with the pulmonologist given the persistent nature of your symptoms.  Return to the emergency department if you worsen.  Incidentally, your CT scan showed gallstones in your gallbladder.  You can discuss this with your doctor.  If you ever had right-sided upper abdominal pain with fever, you should be seen in the emergency department.

## 2019-06-17 ENCOUNTER — Other Ambulatory Visit: Payer: Self-pay | Admitting: Family Medicine

## 2019-06-23 NOTE — Progress Notes (Signed)
[facility] Telemedicine Visit I connected with  TINSLEY EVERMAN on 06/24/19 by a video enabled telemedicine application and verified that I am speaking with the correct person using two identifiers.   I discussed the limitations of evaluation and management by telemedicine. The patient expressed understanding and agreed to proceed.   Patient consented to have virtual visit. Method of visit: Video was attempted, but technology challenges prevented patient from using video, so visit was conducted via telephone.  Encounter participants: Patient: Christina Sparks - located at Home Provider: Oralia Manis - located at [facility] Others (if applicable): None  Chief Complaint: cough  HPI:  Cough Initially seen by Dr. Pollie Meyer on 3/2. At that time trial of augmentin, zyrtec, flonase, and albuterol; no improvement. Seen by me on 3/8 & 3/12. Initially thought of as viral cough and was given atrovent as well as decongestant, no improvement. Patient was then referred to pulmonology given failure of multiple treatments.   Patient reports cough is somewhat improved but states she still "can't hardly breathe". States that after seeing ED she felt some relief. Can't lay on side or stomach without wheezing. Pain in chest after coughing. Now cough is "all in my chest". Reports she is having sputum but not yellow. Over the last week she felt her mucous has broken up and voice is coming back after treatment with antibiotics and steroids. Feels like she is not getting enough oxygen if she wheezes so much. Last night she had worsening symptoms due to being out of antibiotics. Sputum is now pink.   ROS: per HPI  Pertinent PMHx: obesity, cough   Exam:  Respiratory: speaking full sentences, no increased WOB  Assessment/Plan:  Cough Patient with continued chronic cough.  We have tried multiple modalities at this point.  Benefit from seeing pulmonology and obtain testing such as PFT.   Unfortunately there are no pulmonology appointments until the end of April, I called to see if we could get the patient an appointment sooner but was unable to.  Patient will call to schedule an appointment with pulmonology.  Recent chest x-ray in the ED.  Also CT chest obtained there.  Did have some improvement with steroids and antibiotics of consider COPD as a possible etiology.  Given this finding we will plan to treat with inhaled Flovent.  We will also treat with Levaquin as patient had some improvement with antibiotics.  This was per patient's request.  Advised to follow-up if no improvement.  Patient has an in person appointment in 1 week.  This was discussed with Dr. Lum Babe who approved the in person appointment.  Abdominal pain At the end of appointment patient did mention abdominal pain.  Was told in the ED she has gallstones.  Would like to discuss if this is the cause of her abdominal pain.  Will need to evaluate this in person.  I discussed this with Dr. Lum Babe who approved in person appointment given that patient has had chronic cough and is not an acute in nature.  Follow-up in 1 week.  Strict return precautions given.    Time spent during visit with patient: 15 minutes

## 2019-06-24 ENCOUNTER — Ambulatory Visit (INDEPENDENT_AMBULATORY_CARE_PROVIDER_SITE_OTHER): Payer: Self-pay | Admitting: Family Medicine

## 2019-06-24 ENCOUNTER — Encounter: Payer: Self-pay | Admitting: Family Medicine

## 2019-06-24 DIAGNOSIS — R109 Unspecified abdominal pain: Secondary | ICD-10-CM | POA: Insufficient documentation

## 2019-06-24 DIAGNOSIS — R05 Cough: Secondary | ICD-10-CM

## 2019-06-24 DIAGNOSIS — R059 Cough, unspecified: Secondary | ICD-10-CM

## 2019-06-24 MED ORDER — FLOVENT DISKUS 50 MCG/BLIST IN AEPB: 1.0000 | INHALATION_SPRAY | Freq: Two times a day (BID) | RESPIRATORY_TRACT | 12 refills | Status: DC

## 2019-06-24 MED ORDER — LEVOFLOXACIN 500 MG PO TABS: 500.0000 mg | ORAL_TABLET | Freq: Every day | ORAL | 0 refills | Status: AC

## 2019-06-24 NOTE — Assessment & Plan Note (Signed)
Patient with continued chronic cough.  We have tried multiple modalities at this point.  Benefit from seeing pulmonology and obtain testing such as PFT.  Unfortunately there are no pulmonology appointments until the end of April, I called to see if we could get the patient an appointment sooner but was unable to.  Patient will call to schedule an appointment with pulmonology.  Recent chest x-ray in the ED.  Also CT chest obtained there.  Did have some improvement with steroids and antibiotics of consider COPD as a possible etiology.  Given this finding we will plan to treat with inhaled Flovent.  We will also treat with Levaquin as patient had some improvement with antibiotics.  This was per patient's request.  Advised to follow-up if no improvement.  Patient has an in person appointment in 1 week.  This was discussed with Dr. Lum Babe who approved the in person appointment.

## 2019-06-24 NOTE — Assessment & Plan Note (Signed)
At the end of appointment patient did mention abdominal pain.  Was told in the ED she has gallstones.  Would like to discuss if this is the cause of her abdominal pain.  Will need to evaluate this in person.  I discussed this with Dr. Lum Babe who approved in person appointment given that patient has had chronic cough and is not an acute in nature.  Follow-up in 1 week.  Strict return precautions given.

## 2019-06-25 ENCOUNTER — Telehealth (INDEPENDENT_AMBULATORY_CARE_PROVIDER_SITE_OTHER): Payer: Self-pay | Admitting: Family Medicine

## 2019-06-25 ENCOUNTER — Other Ambulatory Visit: Payer: Self-pay

## 2019-06-25 ENCOUNTER — Telehealth: Payer: Self-pay | Admitting: Family Medicine

## 2019-06-25 ENCOUNTER — Encounter: Payer: Self-pay | Admitting: Family Medicine

## 2019-06-25 ENCOUNTER — Other Ambulatory Visit: Payer: Self-pay | Admitting: Family Medicine

## 2019-06-25 DIAGNOSIS — J989 Respiratory disorder, unspecified: Secondary | ICD-10-CM | POA: Insufficient documentation

## 2019-06-25 NOTE — Assessment & Plan Note (Signed)
Unknown respiratory complaint at this poin given waxing and waning symptoms over the last 4 months, CTA was negative for PE but did show concern for potential atoll versus small airway disease versus pneumonialike opacities.  Patient refuses second ED visit, feels levofloxacin has made this much worse so she will stop taking it, was initially requesting more doxy which she is already finished a course of this.  Refused Augmentin  We discussed that Shive return to the emergency department for evaluation I feel the best long-term solution for her is to get her to a pulmonologist, there has been some issue with scheduling her with the [facility] affiliated pulmonologist so we have called [facility].  They have agreed to see her and have scheduled her for a May appointment which they said they might be able to move up if her medical records are sent to them so we are working on that.  ED precautions discussed with patient

## 2019-06-25 NOTE — Progress Notes (Signed)
[facility] Telemedicine Visit  Patient consented to have virtual visit. Method of visit: Video was attempted, but technology challenges prevented patient from using video, so visit was conducted via telephone.  Encounter participants: Patient: Christina Sparks - located at home Provider: Marthenia Rolling - located at [facility] clinic  Chief Complaint: Cough and sore throat  HPI: Undulating cough and shortness of breath with sore throat for the last 3-4 months  Patient has had undulating cough/sore throat/shortness of breath over the last 4 months, she has had multiple antibiotics and over-the-counter medications during this time.  She most recently had an ED visit where a CTA showed no PE but did show some potential of small airway disease versus an atoll versus opacity concerning for pneumonia.  She was prescribed doxy and prednisone for 10 days and sent home.  She said that she felt there was some improvement during this but she ran out of the medication at the course completion and called and spoke to Dr. Darin Engels who gave her levofloxacin.  She said that she started immediately feeling significantly worse on the levofloxacin and stopped taking it, she is calling in for the next step.  She does not feel like her breathing is really improving but does not feel that she needs to be at the emergency department at this time.  She does have a sore throat and feels like she is lost her voice a bit, she does feel like she is not breathing at her normal baseline, she refuses to go the emergency department and does not have a pulse oximeter at home.  Has had negative Covid test during this situation although this was done mid January  ROS: per HPI  Pertinent PMHx: 110-month history of respiratory complaints  Exam:  Respiratory: Patient was speaking in a hoarse voice but speaking in full sentences, she appears to be mentating appropriately on the phone, video technology failed to no physical  exam was possible  Assessment/Plan:  Respiratory complication Unknown respiratory complaint at this poin given waxing and waning symptoms over the last 4 months, CTA was negative for PE but did show concern for potential atoll versus small airway disease versus pneumonialike opacities.  Patient refuses second ED visit, feels levofloxacin has made this much worse so she will stop taking it, was initially requesting more doxy which she is already finished a course of this.  Refused Augmentin  We discussed that Shive return to the emergency department for evaluation I feel the best long-term solution for her is to get her to a pulmonologist, there has been some issue with scheduling her with the [facility] affiliated pulmonologist so we have called [facility].  They have agreed to see her and have scheduled her for a May appointment which they said they might be able to move up if her medical records are sent to them so we are working on that.  ED precautions discussed with patient    Time spent during visit with patient: 15 minutes

## 2019-06-25 NOTE — Telephone Encounter (Signed)
Called patient to discuss symptoms. She was able to take 1 dose of Levaquin but continues to have worsening cough and now coughing up pink sputum, not frank blood. Her throat feels dry and she thinks that may be the cause. Would like to go back on medications prescribed by ED. We discussed antibiotic resistance at her previous appointment.   I discussed going to ED if patient is coughing up blood. We discussed virtual visit this afternoon if her cough is worsening and she is unable to take Levaquin. She is agreeable to this. Patient is only available after 2:30pm. I scheduled her at 3:50pm with Dr. Parke Simmers.   Will route message to Dr. Parke Simmers and PCP.   Orpah Clinton, PGY-3  Family Medicine 06/25/2019 11:25 AM

## 2019-06-29 ENCOUNTER — Ambulatory Visit: Payer: Self-pay

## 2019-07-21 ENCOUNTER — Other Ambulatory Visit: Payer: Self-pay

## 2019-07-21 ENCOUNTER — Encounter: Payer: Self-pay | Admitting: Family Medicine

## 2019-07-21 ENCOUNTER — Ambulatory Visit (INDEPENDENT_AMBULATORY_CARE_PROVIDER_SITE_OTHER): Payer: Self-pay | Admitting: Family Medicine

## 2019-07-21 VITALS — BP 124/76 | HR 84 | Ht 67.0 in | Wt 254.6 lb

## 2019-07-21 DIAGNOSIS — M545 Low back pain, unspecified: Secondary | ICD-10-CM

## 2019-07-21 DIAGNOSIS — R109 Unspecified abdominal pain: Secondary | ICD-10-CM

## 2019-07-21 LAB — POCT URINALYSIS DIP (MANUAL ENTRY)
Bilirubin, UA: NEGATIVE
Blood, UA: NEGATIVE
Glucose, UA: NEGATIVE mg/dL
Ketones, POC UA: NEGATIVE mg/dL
Leukocytes, UA: NEGATIVE
Nitrite, UA: NEGATIVE
Protein Ur, POC: NEGATIVE mg/dL
Spec Grav, UA: 1.025 (ref 1.010–1.025)
Urobilinogen, UA: 0.2 E.U./dL
pH, UA: 6 (ref 5.0–8.0)

## 2019-07-21 NOTE — Patient Instructions (Addendum)
It was great to see you!  Our plans for today:  -I am sorry you are dealing with this back and abdominal pain.  I am ordering a right upper quadrant ultrasound to further evaluate your gallbladder.  We are also checking your urine for signs of a kidney infection. -I will call you and let you know the results as these come back. -I would also like for you to get a follow-up CT scan in about 3 months.  Take care and seek immediate care sooner if you develop any concerns.   Dr. Daymon Larsen [facility]

## 2019-07-21 NOTE — Assessment & Plan Note (Addendum)
Assessment: Right-sided abdominal pain with concurrent right lower back pain.  Differential diagnosis can include shingles, gallbladder etiology, rib pain, musculoskeletal pain from coughing/pulled muscle, renal etiology.  Patient does not endorse a rash suggestive of shingles and pain though sharp appears to be deeper, patient does have recent CTA showing gallstones present without inflamed gallbladder so gallbladder etiology is a possibility.  Patient is also endorsed lots of coughing since December which is slowly improving but could be the cause of rib pain from a stress fracture or musculoskeletal pain from pulled muscle.  Plan: -Urinalysis negative for nitrite, leuk esterase, or other signs of infection -We will order right upper quadrant ultrasound -Plan for repeat CT scan to show resolution of her lung findings from her previous infection in about 2-3 months

## 2019-07-21 NOTE — Progress Notes (Signed)
   Subjective:    Patient ID: Christina Sparks, female    DOB: [birth year], 49 y.o.   MRN: [id number]   CC: Hospital follow-up  HPI:  Christina Sparks is a very pleasant 49 year old female that presents today for follow-up right upper quadrant and back pain after having a CTA in March which showed cholelithiasis with no findings to suggest acute cholecystitis.  She had a CTA done in the emergency department due to cough and shortness of breath to rule out pulmonary embolism.  Patient states her pain started about 5-6 days ago after the cough and starting getting better.  Pain was in a distribution around the left side of her back up to the anterior axillary line. Started several days ago, pain is constant until she takes some motrin and this takes it from a 10/10 to 5/10. No fevers. Pain feels deep that is worse with palpation. Recently has been ill with lots of coughing but states this has been improving.  Patient denies dysuria, vomiting, melena, blood in stool.  ROS: pertinent noted in the HPI   Pertinent PMH, PSH, FH, SoHx: History of chronic cough since December, improving  Smoking status -never smoker  Objective:  BP 124/76   Pulse 84   Ht 5\' 7"  (1.702 m)   Wt 254 lb 9.6 oz (115.5 kg)   LMP 06/28/2019   BMI 39.88 kg/m   Vitals and nursing note reviewed  General: NAD, pleasant, able to participate in exam Cardiac: RRR, no murmurs. Respiratory: CTAB, normal effort Abdomen: Bowel sounds present, patient does have some tenderness to palpation of the right upper quadrant as well as the right back in the distribution of the right kidney, nondistended, negative Murphy sign, no rashes noted.  There appears to be no hepatomegaly on exam Skin: warm and dry, no rashes noted Psych: Normal affect and mood   Assessment & Plan:    Right sided abdominal pain Assessment: Right-sided abdominal pain with concurrent right lower back pain.  Differential diagnosis can include shingles, gallbladder  etiology, rib pain, musculoskeletal pain from coughing/pulled muscle, renal etiology.  Patient does not endorse a rash suggestive of shingles and pain though sharp appears to be deeper, patient does have recent CTA showing gallstones present without inflamed gallbladder so gallbladder etiology is a possibility.  Patient is also endorsed lots of coughing since December which is slowly improving but could be the cause of rib pain from a stress fracture or musculoskeletal pain from pulled muscle.  Plan: -Urinalysis negative for nitrite, leuk esterase, or other signs of infection -We will order right upper quadrant ultrasound -Plan for repeat CT scan to show resolution of her lung findings from her previous infection in about 2-3 months    January, DO [facility] PGY-1

## 2019-07-27 ENCOUNTER — Other Ambulatory Visit: Payer: Self-pay

## 2019-07-27 ENCOUNTER — Other Ambulatory Visit: Payer: Self-pay | Admitting: Family Medicine

## 2019-07-27 ENCOUNTER — Ambulatory Visit (HOSPITAL_COMMUNITY)
Admission: RE | Admit: 2019-07-27 | Discharge: 2019-07-27 | Disposition: A | Discharge status: Home / Self Care | Payer: Self-pay | Source: Ambulatory Visit | Attending: Family Medicine | Admitting: Family Medicine

## 2019-07-27 DIAGNOSIS — R109 Unspecified abdominal pain: Secondary | ICD-10-CM | POA: Insufficient documentation

## 2019-07-29 NOTE — Progress Notes (Signed)
Called patient to inform her that her RUQ ultrasound showed gallstones but no signs of inflammation or infection of the gallbladder. Patient stated her pain was slowly improving and that it might have been muscular in nature. Instructed patient to follow up with [place] in 2-3 weeks if pain does not continue to improve or sooner if it worsens.

## 2019-08-06 ENCOUNTER — Other Ambulatory Visit: Payer: Self-pay | Admitting: *Deleted

## 2019-08-06 ENCOUNTER — Other Ambulatory Visit: Payer: Self-pay

## 2019-08-06 ENCOUNTER — Ambulatory Visit (INDEPENDENT_AMBULATORY_CARE_PROVIDER_SITE_OTHER): Payer: Self-pay | Admitting: Family Medicine

## 2019-08-06 ENCOUNTER — Encounter: Payer: Self-pay | Admitting: Family Medicine

## 2019-08-06 ENCOUNTER — Other Ambulatory Visit (HOSPITAL_COMMUNITY)
Admission: RE | Admit: 2019-08-06 | Discharge: 2019-08-06 | Disposition: A | Discharge status: Home / Self Care | Payer: Self-pay | Source: Ambulatory Visit | Attending: Family Medicine | Admitting: Family Medicine

## 2019-08-06 VITALS — BP 126/78 | HR 74 | Wt 252.0 lb

## 2019-08-06 DIAGNOSIS — N898 Other specified noninflammatory disorders of vagina: Secondary | ICD-10-CM

## 2019-08-06 DIAGNOSIS — B9689 Other specified bacterial agents as the cause of diseases classified elsewhere: Secondary | ICD-10-CM

## 2019-08-06 DIAGNOSIS — L259 Unspecified contact dermatitis, unspecified cause: Secondary | ICD-10-CM

## 2019-08-06 DIAGNOSIS — N760 Acute vaginitis: Secondary | ICD-10-CM

## 2019-08-06 LAB — POCT WET PREP (WET MOUNT)
Clue Cells Wet Prep Whiff POC: POSITIVE
Trichomonas Wet Prep HPF POC: ABSENT

## 2019-08-06 MED ORDER — HYDROCORTISONE 2 % EX LOTN: 1.0000 | TOPICAL_LOTION | Freq: Two times a day (BID) | CUTANEOUS | 0 refills | Status: DC

## 2019-08-06 MED ORDER — METRONIDAZOLE 500 MG PO TABS: 500.0000 mg | ORAL_TABLET | Freq: Two times a day (BID) | ORAL | 0 refills | Status: DC

## 2019-08-06 MED ORDER — HYDROCORTISONE ACETATE 2.5 % EX CREA: 1.0000 | TOPICAL_CREAM | Freq: Two times a day (BID) | CUTANEOUS | 0 refills | Status: AC

## 2019-08-06 NOTE — Telephone Encounter (Signed)
Pharmacy needs clarifications sent in on hydrocortisone lotion.  They want to make sure you want the lotion and not the cream.  Also the directions say to use 1 tube in morning and then at night.  They need an amount to be applied during those times.  Quentin Shorey,CMA

## 2019-08-06 NOTE — Patient Instructions (Signed)
It was great seeing you today!  Please check-out at the front desk before leaving the clinic.If you need to be seen for any new issues we're happy to fit you in, just give [place] a call.  Visit Remembers: - Stop by the pharmacy to pick up your prescriptions  - Continue to work on your healthy eating habits and incorporating exercise into your daily life.  - Medicine Changes: Start Metronidazole for bacterial vaginosis.  Start Hydrocortisone for rash on your back.     Call us if: your symptoms are not improving    Regarding lab work today:  Due to recent changes in healthcare laws, you may see the results of your imaging and laboratory studies on MyChart before your provider has had a chance to review them.  I understand that in some cases there may be results that are confusing or concerning to you. Not all laboratory results come back in the same time frame and you may be waiting for multiple results in order to interpret others.  Please give [place] 72 hours in order for your provider to thoroughly review all the results before contacting the office for clarification of your results. If everything is normal, you will get a letter in the mail or a message in My Chart. Please give [place] a call if you do not hear from [place] after 2 weeks.  Please bring all of your medications with you to each visit.    If you haven't already, sign up for My Chart to have easy access to your labs results, and communication with your primary care physician.  Feel free to call with any questions or concerns at any time, at [phone].   Take care,  Dr. [facility]  Bacterial Vaginosis  Bacterial vaginosis is an infection of the vagina. It happens when too many normal germs (healthy bacteria) grow in the vagina. This infection puts you at risk for infections from sex (STIs). Treating this infection can lower your risk for some STIs. You should also treat this if you are pregnant. It can cause  your baby to be born early. Follow these instructions at home: Medicines  Take over-the-counter and prescription medicines only as told by your doctor.  Take or use your antibiotic medicine as told by your doctor. Do not stop taking or using it even if you start to feel better. General instructions  If you your sexual partner is a woman, tell her that you have this infection. She needs to get treatment if she has symptoms. If you have a female partner, he does not need to be treated.  During treatment: ? Avoid sex. ? Do not douche. ? Avoid alcohol as told. ? Avoid breastfeeding as told.  Drink enough fluid to keep your pee (urine) clear or pale yellow.  Keep your vagina and butt (rectum) clean. ? Wash the area with warm water every day. ? Wipe from front to back after you use the toilet.  Keep all follow-up visits as told by your doctor. This is important. Preventing this condition  Do not douche.  Use only warm water to wash around your vagina.  Use protection when you have sex. This includes: ? Latex condoms. ? Dental dams.  Limit how many people you have sex with. It is best to only have sex with the same person (be monogamous).  Get tested for STIs. Have your partner get tested.  Wear underwear that is cotton or lined with cotton.  Avoid  tight pants and pantyhose. This is most important in summer.  Do not use any products that have nicotine or tobacco in them. These include cigarettes and e-cigarettes. If you need help quitting, ask your doctor.  Do not use illegal drugs.  Limit how much alcohol you drink. Contact a doctor if:  Your symptoms do not get better, even after you are treated.  You have more discharge or pain when you pee (urinate).  You have a fever.  You have pain in your belly (abdomen).  You have pain with sex.  Your bleed from your vagina between periods. Summary  This infection happens when too many germs (bacteria) grow in the vagina.   Treating this condition can lower your risk for some infections from sex (STIs).  You should also treat this if you are pregnant. It can cause early (premature) birth.  Do not stop taking or using your antibiotic medicine even if you start to feel better. This information is not intended to replace advice given to you by your health care provider. Make sure you discuss any questions you have with your health care provider. Document Revised: 02/22/2017 Document Reviewed: 11/26/2015 Elsevier Patient Education  2020 [employer].

## 2019-08-06 NOTE — Progress Notes (Signed)
    SUBJECTIVE:   CHIEF COMPLAINT / HPI:    Vaginal Discharge - has been ongoing for past week - Denies itching, burning, abdominal pain, nausea or vomiting. Denies burning with urination, hematuria, groin rash or fevers. Intermittent back pain.  - Discharge described as light white, thin.  - Patient reports Trich about 2 years ago STD in the past and was treated.  - Sexully active with one female partner that have been together about a year. Does not use condoms.  -Patient's last menstrual period was 06/28/2019 (exact date). and was normal for patient.  - Patient reports yeast in the past.  - Patient denies douching.  Scented lotions and soap. Washes externally only.   -Contraception: none   Rash Patient reports rash on abdomen and back that started about a month ago.  Reports itching and burning.    ROS see HPI Smoking Status noted    PERTINENT  PMH / PSH: hx of BV and trichomoniasis, obesity,    OBJECTIVE:   BP 126/78   Pulse 74   Wt 252 lb (114.3 kg)   LMP 06/28/2019 (Exact Date)   BMI 39.47 kg/m    GEN: well appearing female in no acute distress  CV: regular rate, distal pulses intact RESP: no increased work of breathing ABD: Soft, Nontender, Non-distended, abdominal straie present Pelvic: VULVA: normal appearing vulva with no masses, tenderness or lesions, VAGINA: vaginal discharge - white, malodorous and thin, no lesions, UTERUS: uterus is normal size, shape, consistency and nontender, ADNEXA: normal adnexa in size, nontender and no masses, no palpable internal organs, WET MOUNT done - results: KOH done, clue cells, excessive bacteria, DNA probe for chlamydia and GC obtained, exam chaperoned by CMA. MSK: no edema, or cyanosis noted SKIN: group of macules with some scaling visible at the borders, erythema present, no excoriations seen  NEURO:alert and oriented   ASSESSMENT/PLAN:   Bacterial vaginosis BV (bacterial vaginosis)  Confirmed on wet prep. G/C  Percell Locus is pending. Symptoms consistent with this.  - Treatment: Flagyl 500 BID x 7 days and abstain from coitus during course of treatment. Advised patient to not drink alcohol while taking this medication.  - F/U if symptoms not improving or getting worse.  - Will f/u on G/C Chlamydia and call in Rx if positive.  - Self care instructions given including avoiding douching. Handout given.  - F/U with PCP as needed.  - Return precautions including abdominal pain, fever, chills, nausea, or vomiting given.     Contact dermatitis Rx Topical hydrocortisone sent to pharmacy  - advised patient not to use >14 days due to risk of hypopigmentations and thinning of the skin     RASH: Hydrocortisone cream   Katha Cabal, DO [facility]

## 2019-08-06 NOTE — Progress Notes (Deleted)
    SUBJECTIVE:   CHIEF COMPLAINT / HPI:   Vaginal discharge:  ***  PERTINENT  PMH / PSH: ***  OBJECTIVE:   BP 126/78   Pulse 74   Wt 252 lb (114.3 kg)   LMP 06/28/2019 (Exact Date)   BMI 39.47 kg/m    General: NAD, pleasant, able to participate in exam Cardiac: RRR, no murmurs. Respiratory: CTAB, normal effort, No wheezes, rales or rhonchi Abdomen: Bowel sounds present, nontender, nondistended, no hepatosplenomegaly. Pelvic exam: {pelvic exam:315900::"normal external genitalia, vulva, vagina, cervix, uterus and adnexa"}. : no edema or cyanosis. Skin: warm and dry, no rashes noted Neuro: alert, no obvious focal deficits Psych: Normal affect and mood  ASSESSMENT/PLAN:   No problem-specific Assessment & Plan notes found for this encounter.     Jackelyn Poling, DO [facility]

## 2019-08-07 LAB — CERVICOVAGINAL ANCILLARY ONLY
Chlamydia: NEGATIVE
Comment: NEGATIVE
Comment: NORMAL
Neisseria Gonorrhea: NEGATIVE

## 2019-08-15 ENCOUNTER — Other Ambulatory Visit: Payer: Self-pay | Admitting: Family Medicine

## 2019-08-16 DIAGNOSIS — N760 Acute vaginitis: Secondary | ICD-10-CM | POA: Insufficient documentation

## 2019-08-16 DIAGNOSIS — L259 Unspecified contact dermatitis, unspecified cause: Secondary | ICD-10-CM | POA: Insufficient documentation

## 2019-08-16 NOTE — Assessment & Plan Note (Addendum)
Mid back. Rx Topical hydrocortisone sent to pharmacy  - advised patient not to use >14 days due to risk of hypopigmentations and thinning of the skin

## 2019-08-16 NOTE — Assessment & Plan Note (Addendum)
BV (bacterial vaginosis)  Confirmed on wet prep. G/C /Chlamydia is pending. Symptoms consistent with this.  - Treatment: Flagyl 500 BID x 7 days and abstain from coitus during course of treatment. Advised patient to not drink alcohol while taking this medication.  - F/U if symptoms not improving or getting worse.  - Will f/u on G/C Chlamydia and call in Rx if positive.  - Self care instructions given including avoiding douching. Handout given.  - F/U with PCP as needed.  - Return precautions including abdominal pain, fever, chills, nausea, or vomiting given.      

## 2019-09-28 ENCOUNTER — Ambulatory Visit
Admission: EM | Admit: 2019-09-28 | Discharge: 2019-09-28 | Disposition: A | Discharge status: Home / Self Care | Payer: Self-pay | Attending: Physician Assistant | Admitting: Physician Assistant

## 2019-09-28 ENCOUNTER — Encounter: Payer: Self-pay | Admitting: Emergency Medicine

## 2019-09-28 ENCOUNTER — Other Ambulatory Visit: Payer: Self-pay

## 2019-09-28 DIAGNOSIS — J209 Acute bronchitis, unspecified: Secondary | ICD-10-CM

## 2019-09-28 MED ORDER — PREDNISONE 50 MG PO TABS
50.0000 mg | ORAL_TABLET | Freq: Every day | ORAL | 0 refills | Status: DC
Start: 2019-09-28 — End: 2020-03-30

## 2019-09-28 MED ORDER — DOXYCYCLINE HYCLATE 100 MG PO CAPS
100.0000 mg | ORAL_CAPSULE | Freq: Two times a day (BID) | ORAL | 0 refills | Status: DC
Start: 2019-09-28 — End: 2020-03-30

## 2019-09-28 MED ORDER — ALBUTEROL SULFATE HFA 108 (90 BASE) MCG/ACT IN AERS: 2.0000 | INHALATION_SPRAY | RESPIRATORY_TRACT | 0 refills | Status: DC | PRN

## 2019-09-28 NOTE — ED Provider Notes (Signed)
[facility]    CSN: [id number] Arrival date & time: 09/28/19  1200      History   Chief Complaint Chief Complaint  Patient presents with  . URI    HPI Christina Sparks is a 49 y.o. female.   49 year old female comes in for 2 week of URI symptoms.  Cough, nasal congestion. Denies fever, chills, body aches. Denies abdominal pain, nausea, vomiting, diarrhea. Denies shortness of breath, loss of taste/smell. Wheezing at times. Never smoker.      Past Medical History:  Diagnosis Date  . Anemia   . Back pain   . Knee pain   . Migraines   . S/P bilateral breast reduction   . S/P tubal ligation     Patient Active Problem List   Diagnosis Date Noted  . Bacterial vaginosis 08/16/2019  . Contact dermatitis 08/16/2019  . Respiratory complication 06/25/2019  . Right sided abdominal pain 06/24/2019  . Viral respiratory illness 03/24/2019  . Irregular menstrual cycle 06/03/2018  . Cervical stenosis of spine 09/05/2017  . Elevated BP 09/29/2015  . Healthcare maintenance 09/29/2015  . Paresthesia of hand 09/09/2014  . Cough 08/02/2014  . Acute bronchitis 07/13/2014  . Plantar fasciitis, bilateral 12/30/2013  . Back pain 02/03/2013  . Acid reflux 02/03/2013  . Unspecified constipation 02/03/2013  . Fatigue 12/10/2011  . Muscle spasm 12/04/2011  . Anxiety and depression 11/08/2011  . Osteoarthritis of right knee 06/08/2011  . Migraines 06/04/2011  . Obesity 06/04/2011  . Anemia 06/01/2011    Past Surgical History:  Procedure Laterality Date  . BREAST SURGERY     breast reduction  . CESAREAN SECTION    . TUBAL LIGATION      OB History    Gravida  3   Para  3   Term      Preterm      AB      Living        SAB      TAB      Ectopic      Multiple      Live Births               Home Medications    Prior to Admission medications   Medication Sig Start Date End Date Taking? Authorizing Provider  albuterol (VENTOLIN HFA) 108 (90  Base) MCG/ACT inhaler Inhale 2 puffs into the lungs every 4 (four) hours as needed for wheezing or shortness of breath. 09/28/19   Cathie Hoops, Sohaib Vereen V, PA-C  cetirizine (ZYRTEC) 10 MG tablet TAKE 1 TABLET BY MOUTH EVERY DAY 06/25/19   Jackelyn Poling, DO  doxycycline (VIBRAMYCIN) 100 MG capsule Take 1 capsule (100 mg total) by mouth 2 (two) times daily. 09/28/19   Cathie Hoops, Maat Kafer V, PA-C  fluticasone (FLOVENT DISKUS) 50 MCG/BLIST diskus inhaler Inhale 1 puff into the lungs 2 (two) times daily. 06/24/19   Oralia Manis, DO  gabapentin (NEURONTIN) 100 MG capsule TAKE 1 CAPSULE BY MOUTH 3 TIMES DAILY AS NEEDED (NERVE PAIN). 06/02/18   Tillman Sers, DO  HYDROCORTISONE, TOPICAL, 2 % LOTN Apply 1 Tube topically in the morning and at bedtime. Do not use more than 14 days. 08/06/19   Brimage, Seward Meth, DO  ibuprofen (ADVIL) 800 MG tablet TAKE 1 TABLET BY MOUTH EVERY 8 HOURS AS NEEDED. 03/23/19   Brock Bad, MD  ipratropium (ATROVENT) 0.03 % nasal spray Place 2 sprays into both nostrils every 12 (twelve) hours. 06/02/19   Oralia Manis,  DO  loratadine-pseudoephedrine (CLARITIN-D 24 HOUR) 10-240 MG 24 hr tablet Take 1 tablet by mouth daily. 06/01/19   Oralia Manis, DO  predniSONE (DELTASONE) 50 MG tablet Take 1 tablet (50 mg total) by mouth daily with breakfast. 09/28/19   Belinda Fisher, PA-C    Family History Family History  Problem Relation Age of Onset  . Hypertension Mother   . Heart disease Father   . Hyperlipidemia Father     Social History Social History   Tobacco Use  . Smoking status: Never Smoker  . Smokeless tobacco: Never Used  Substance Use Topics  . Alcohol use: No    Alcohol/week: 2.0 standard drinks    Types: 2 Glasses of wine per week    Comment: occasional  . Drug use: No     Allergies   Sulfa drugs cross reactors   Review of Systems Review of Systems  Reason unable to perform ROS: See HPI as above.     Physical Exam Triage Vital Signs ED Triage Vitals  Enc Vitals Group     BP  09/28/19 1214 (!) 144/90     Pulse Rate 09/28/19 1214 80     Resp 09/28/19 1214 18     Temp 09/28/19 1214 98.1 F (36.7 C)     Temp Source 09/28/19 1214 Oral     SpO2 09/28/19 1214 97 %     Weight --      Height --      Head Circumference --      Peak Flow --      Pain Score 09/28/19 1217 5     Pain Loc --      Pain Edu? --      Excl. in GC? --    No data found.  Updated Vital Signs BP (!) 144/90 (BP Location: Left Arm)   Pulse 80   Temp 98.1 F (36.7 C) (Oral)   Resp 18   LMP 09/04/2019   SpO2 97%   Physical Exam Constitutional:      General: She is not in acute distress.    Appearance: Normal appearance. She is not ill-appearing, toxic-appearing or diaphoretic.  HENT:     Head: Normocephalic and atraumatic.     Mouth/Throat:     Mouth: Mucous membranes are moist.     Pharynx: Oropharynx is clear. Uvula midline.  Cardiovascular:     Rate and Rhythm: Normal rate and regular rhythm.     Heart sounds: Normal heart sounds. No murmur heard.  No friction rub. No gallop.   Pulmonary:     Effort: Pulmonary effort is normal. No accessory muscle usage, prolonged expiration, respiratory distress or retractions.     Comments: Speaking in full sentences without difficulty. Lungs with expiratory wheezing and rhonchi throughout. No rales.  Musculoskeletal:     Cervical back: Normal range of motion and neck supple.  Skin:    General: Skin is warm and dry.  Neurological:     General: No focal deficit present.     Mental Status: She is alert and oriented to person, place, and time.      UC Treatments / Results  Labs (all labs ordered are listed, but only abnormal results are displayed) Labs Reviewed - No data to display  EKG   Radiology No results found.  Procedures Procedures (including critical care time)  Medications Ordered in UC Medications - No data to display  Initial Impression / Assessment and Plan / UC Course  I have reviewed  the triage vital signs and  the nursing notes.  Pertinent labs & imaging results that were available during my care of the patient were reviewed by me and considered in my medical decision making (see chart for details).    Patient afebrile, nontoxic in appearance.  Stable vitals.  Lungs with expiratory wheezing and rhonchi throughout.  Will start prednisone and doxycycline as directed.  Albuterol as needed.  Return precautions given.  Final Clinical Impressions(s) / UC Diagnoses   Final diagnoses:  Acute bronchitis, unspecified organism   ED Prescriptions    Medication Sig Dispense Auth. Provider   albuterol (VENTOLIN HFA) 108 (90 Base) MCG/ACT inhaler Inhale 2 puffs into the lungs every 4 (four) hours as needed for wheezing or shortness of breath. 6.7 g Ezeriah Luty V, PA-C   predniSONE (DELTASONE) 50 MG tablet Take 1 tablet (50 mg total) by mouth daily with breakfast. 5 tablet Gurleen Larrivee V, PA-C   doxycycline (VIBRAMYCIN) 100 MG capsule Take 1 capsule (100 mg total) by mouth 2 (two) times daily. 14 capsule Belinda Fisher, PA-C     PDMP not reviewed this encounter.   Belinda Fisher, PA-C 09/28/19 1251

## 2019-09-28 NOTE — ED Notes (Signed)
Patient able to ambulate independently  

## 2019-09-28 NOTE — Discharge Instructions (Signed)
Start doxycycline and prednisone as directed.  Albuterol as needed.  Continue Flonase as directed.  Follow-up with your primary care for reevaluation, and follow-up of CT scan if needed.  If having worsening symptoms, chest pain, shortness of breath, go to the emergency department for further evaluation.

## 2019-09-28 NOTE — ED Triage Notes (Signed)
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  Patient states she was seen for similar symptoms in January and had a CT which showed "spots on her lungs".  Patient states she has tried alka-seltzer, sudafed, benadryl.  C/o heaviness to upper chest, worse with coughing.

## 2019-10-07 ENCOUNTER — Telehealth: Payer: Self-pay | Admitting: Family Medicine

## 2019-10-07 DIAGNOSIS — R059 Cough, unspecified: Secondary | ICD-10-CM

## 2019-10-07 NOTE — Telephone Encounter (Signed)
Called patient to discuss previous recommendation of repeating her CT scan around July after having a CTA in March which showed pneumonia, to look for resolution of her symptoms.  Patient states that she continues to have a cough even months later and does not feel quite back to normal since March.  Patient states she is a never smoker but would indeed like to repeat the CT scan to see if it still shows pneumonia or if there is anything else concerning in it.  We will plan to order repeat CT chest as recommended during previous patient encounter, particularly as patient's symptoms have not resolved after several months.

## 2019-10-16 ENCOUNTER — Telehealth: Payer: Self-pay

## 2019-10-16 NOTE — Telephone Encounter (Signed)
-----   Message from Jackelyn Poling, DO sent at 10/15/2019  7:31 AM EDT ----- Regarding: CT chest Hey team,  Is it possible to get this patient scheduled for her CT chest? I ordered it a few days ago after speaking to her via phone (and confirming with attending that she indeed needs it), but am not sure if we have to actually schedule it or not.  Thank you, Alycia Rossetti

## 2019-10-16 NOTE — Telephone Encounter (Signed)
Attempted to reach Ms. Inda to verify her current insurance. I was unable to reach her, but LVM of needing this information in order to make her appt with GI. I also attempted to reach out to the other number that is in her chart for friend Lucina Mellow. No one answered and there was no option for me to leave a voice message.  I attempted to make her appt at [facility], but could not continue due to having questions about what insurance she is carrying? Erica from [facility] stated that their office has been trying to reach Ms. Gass as well but have not be able to reach her. Alcario Drought stated that she will reach out to the ones that can tell what the price may be for self pay.  Aquilla Solian, CMA

## 2019-10-28 ENCOUNTER — Other Ambulatory Visit: Payer: Self-pay

## 2020-02-25 ENCOUNTER — Ambulatory Visit: Payer: Self-pay

## 2020-03-30 ENCOUNTER — Ambulatory Visit: Payer: 59 | Admitting: Family Medicine

## 2020-03-30 ENCOUNTER — Other Ambulatory Visit: Payer: Self-pay

## 2020-03-30 ENCOUNTER — Encounter: Payer: Self-pay | Admitting: Family Medicine

## 2020-03-30 VITALS — BP 120/80 | HR 86 | Wt 258.8 lb

## 2020-03-30 DIAGNOSIS — F419 Anxiety disorder, unspecified: Secondary | ICD-10-CM | POA: Diagnosis not present

## 2020-03-30 DIAGNOSIS — Z23 Encounter for immunization: Secondary | ICD-10-CM | POA: Diagnosis not present

## 2020-03-30 DIAGNOSIS — F32A Depression, unspecified: Secondary | ICD-10-CM

## 2020-03-30 NOTE — Assessment & Plan Note (Signed)
-  PHQ-9 score of 21, reviewed and discussed -offered short-term therapy sessions with Dr. Shawnee Knapp, patient refuses at this time -refuses any medication at this time -discussed sleep hygiene techniques  -instructed to contact office if needed or if she decides to participate in therapy  -follow up with mood check in 3 weeks when patient comes in for 2nd COVID vaccine dose

## 2020-03-30 NOTE — Patient Instructions (Signed)
It was great seeing you today!  Congratulations on getting the 1st dose of your COVID vaccine. It is normal for you to not feel yourself, but should feel back to yourself within 48 hours. Please avoid screens before bed and try to practice some of the things we discussed to help you better fall and stay asleep. If you do decide to try short-term therapy, please contact our office.   Please follow up for your 2nd COVID vaccine dose, if anything arises between now and then, please don't hesitate to contact our office.   Thank you for allowing [place] to be a part of your medical care!  Thank you, Dr. Robyne Peers

## 2020-03-30 NOTE — Progress Notes (Addendum)
    SUBJECTIVE:   CHIEF COMPLAINT / HPI:   Patient presents to clinic for 1st dose of COVID vaccine. Denies previous allergic reactions to vaccines since she never had a vaccine prior to this encounter. Denies cough, congestion, rhinorrhea, fever and dyspnea. Denies any concerns or hesitations about the vaccine.   PERTINENT  PMH / PSH:   Depression Patient endorsing more episodes of feeling down and depressed that have worsened over the past 2 months. She attributes this to her personal life and career changes. Denies having a support system, states that she likes to be alone during these times. Currently lives  With 31 year old son. Also endorsing difficulty falling and staying asleep, little interest in doing things that were once pleasurable to her and states that she just feels like sleeping during the day. Denies taking any medications, denies psychiatric-related hospitalizations. Denies participating in therapy currently but states that she does receive counseling with her preacher at church.   OBJECTIVE:   BP 120/80   Pulse 86   Wt 258 lb 12.8 oz (117.4 kg)   SpO2 98%   BMI 40.53 kg/m   General: Patient well-appearing, in no acute distress. CV: RRR, no murmurs or gallops auscultated Resp: lungs clear to auscultation bilaterally Abdomen: soft, non-tender, presence of active bowel sounds Ext: radial pulses strong and equal bilaterally, no LE edema noted, no calf tenderness noted Psych: denies suicidal ideations  ASSESSMENT/PLAN:   Anxiety and depression -PHQ-9 score of 21, reviewed and discussed -offered short-term therapy sessions with Dr. Shawnee Knapp, patient refuses at this time -refuses any medication at this time -discussed sleep hygiene techniques  -instructed to contact office if needed or if she decides to participate in therapy  -follow up with mood check in 3 weeks when patient comes in for 2nd COVID vaccine dose    Patient received first dose of COVID vaccine, waited  the appropriate period without immediate complications. Return precautions discussed. Patient refuses influenza vaccine at this time.  Follow up in about 3 weeks for 2nd COVID vaccine dose.   Reece Leader, DO [facility]

## 2020-04-13 ENCOUNTER — Ambulatory Visit (INDEPENDENT_AMBULATORY_CARE_PROVIDER_SITE_OTHER): Payer: 59 | Admitting: Family Medicine

## 2020-04-13 VITALS — BP 122/80 | HR 97 | Temp 98.4°F

## 2020-04-13 DIAGNOSIS — R0981 Nasal congestion: Secondary | ICD-10-CM | POA: Diagnosis not present

## 2020-04-13 MED ORDER — ALBUTEROL SULFATE HFA 108 (90 BASE) MCG/ACT IN AERS: 2.0000 | INHALATION_SPRAY | RESPIRATORY_TRACT | 0 refills | Status: DC | PRN

## 2020-04-13 NOTE — Patient Instructions (Signed)
Your symptoms are likely caused by a viral upper respiratory infection which usually last about 5 days.  We have tested you for COVID and you should not return to work until their test result returns and is negative.  If you are positive for COVID, you must stay out of work 5 days from symptom onset or positive test (whichever is first).  If after five days, you are without symptoms or have only minimal symptoms, you can go back to work, but MUST wear a mask.  COVID guidelines are changing rapidly, so I recommended checking the [facility] website as well.  You can try honey in warm (not hot) liquid for cough.  You can also try nasal saline for congestion, vaseline for nose irritation, and a humidifer in your room at night.  Avoid over the counter cough/cold medications.  You can also use tylenol.  Come back if you aren't better in a week or have difficulty breathing.    If you have any questions, please contact our office.

## 2020-04-13 NOTE — Assessment & Plan Note (Signed)
Symptoms most likely secondary to viral upper respiratory infection.  Advised patient that given current COVID-19 pandemic, will obtain test to rule out.  Advised proper isolation.  Per CDC guidelines, 5 days, then can return to work if symptoms are mild, improving, and she is without fever.  Must continue to wear mask strictly for 5 days.  Note written for work.  Supportive care advised including honey, nasal saline.  Given that she reports wheezing at home, discussed albuterol and she would like to continue this, refill sent.  Lungs sound clear on exam and she has stable vitals which are reassuring.  Advised to continue hydration as well.  Return to care if no improvement or if worsening in symptoms including difficulty breathing, decreased p.o. intake, decreased UOP.  She voiced understanding.

## 2020-04-13 NOTE — Progress Notes (Signed)
    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 female in NAD, sounds congested while speaking HEENT: b/l nares with swollen turbinates and clear rhinorrhea, throat clear, MMM Neck: no cervical LAD Cardio: RRR no m/r/g Lungs: CTAB, no wheezing, no rhonchi, no crackles, no IWOB on RA Skin: warm and dry Extremities: No edema. ambulating without difficulty   ASSESSMENT/PLAN:   Nasal congestion Symptoms most likely secondary to viral upper respiratory infection.  Advised patient that given current COVID-19 pandemic, will obtain test to rule out.  Advised proper isolation.  Per CDC guidelines, 5 days, then can return to work if symptoms are mild, improving, and she is without fever.  Must continue to wear mask strictly for 5 days.  Note written for work.  Supportive care advised including honey, nasal saline.  Given that she reports wheezing at home, discussed albuterol and she would like to continue this, refill sent.  Lungs sound clear on exam and she has stable vitals which are reassuring.  Advised to continue hydration as well.  Return to care if no improvement or if worsening in symptoms including difficulty breathing, decreased p.o. intake, decreased UOP.  She voiced understanding.     Unknown Jim, DO [facility]

## 2020-04-15 LAB — SARS-COV-2, NAA 2 DAY TAT

## 2020-04-15 LAB — NOVEL CORONAVIRUS, NAA: SARS-CoV-2, NAA: NOT DETECTED

## 2020-04-20 ENCOUNTER — Ambulatory Visit: Payer: 59

## 2020-04-21 ENCOUNTER — Ambulatory Visit: Payer: 59

## 2020-04-21 NOTE — Progress Notes (Deleted)
    SUBJECTIVE:   CHIEF COMPLAINT / HPI: congestion/fatigue  Christina Sparks is a 50 year old female presenting for evaluation of congestion/fatigue.  She was most recently seen in our office on 1/19 for congestion and cough for a few days, recommended Covid testing at that time.  Today,    PERTINENT  PMH / PSH: Osteoarthritis, anxiety/depression, anemia, migraines  OBJECTIVE:   There were no vitals taken for this visit.  ***  ASSESSMENT/PLAN:   No problem-specific Assessment & Plan notes found for this encounter.     Allayne Stack, DO [facility]

## 2020-04-23 ENCOUNTER — Encounter (HOSPITAL_COMMUNITY): Payer: Self-pay | Admitting: Emergency Medicine

## 2020-04-23 ENCOUNTER — Other Ambulatory Visit: Payer: Self-pay

## 2020-04-23 ENCOUNTER — Ambulatory Visit (INDEPENDENT_AMBULATORY_CARE_PROVIDER_SITE_OTHER): Payer: 59

## 2020-04-23 ENCOUNTER — Ambulatory Visit (HOSPITAL_COMMUNITY)
Admission: EM | Admit: 2020-04-23 | Discharge: 2020-04-23 | Disposition: A | Discharge status: Home / Self Care | Payer: 59 | Attending: Emergency Medicine | Admitting: Emergency Medicine

## 2020-04-23 DIAGNOSIS — R0602 Shortness of breath: Secondary | ICD-10-CM | POA: Diagnosis present

## 2020-04-23 DIAGNOSIS — J069 Acute upper respiratory infection, unspecified: Secondary | ICD-10-CM | POA: Diagnosis not present

## 2020-04-23 DIAGNOSIS — Z20822 Contact with and (suspected) exposure to covid-19: Secondary | ICD-10-CM | POA: Diagnosis not present

## 2020-04-23 DIAGNOSIS — Z6841 Body Mass Index (BMI) 40.0 and over, adult: Secondary | ICD-10-CM | POA: Insufficient documentation

## 2020-04-23 DIAGNOSIS — E669 Obesity, unspecified: Secondary | ICD-10-CM | POA: Insufficient documentation

## 2020-04-23 DIAGNOSIS — R030 Elevated blood-pressure reading, without diagnosis of hypertension: Secondary | ICD-10-CM | POA: Insufficient documentation

## 2020-04-23 DIAGNOSIS — R509 Fever, unspecified: Secondary | ICD-10-CM

## 2020-04-23 DIAGNOSIS — J0110 Acute frontal sinusitis, unspecified: Secondary | ICD-10-CM

## 2020-04-23 LAB — SARS CORONAVIRUS 2 (TAT 6-24 HRS): SARS Coronavirus 2: NEGATIVE

## 2020-04-23 MED ORDER — AMOXICILLIN 875 MG PO TABS: 875.0000 mg | ORAL_TABLET | Freq: Two times a day (BID) | ORAL | 0 refills | Status: AC

## 2020-04-23 NOTE — ED Provider Notes (Signed)
[facility]    CSN: [id number] Arrival date & time: 04/23/20  1211      History   Chief Complaint Chief Complaint  Patient presents with  . Cough  . Shortness of Breath    HPI Christina Sparks is a 50 y.o. female.  Patient presents with 2-week history of cough, nasal congestion, runny nose, shortness of breath, tightness in her chest with coughing, fever, headache. She states her symptoms started after she received the first COVID vaccine. She denies vomiting, diarrhea, or other symptoms. OTC treatment attempted at home. She was seen by her PCP on 04/13/2020; diagnosed with nasal congestion; COVID negative. She states her PCP sent her here to get a chest x-ray. Her medical history includes cervical stenosis, migraines, osteoarthritis, obesity, anxiety, depression.  The history is provided by the patient and medical records.    Past Medical History:  Diagnosis Date  . Anemia   . Back pain   . Knee pain   . Migraines   . S/P bilateral breast reduction   . S/P tubal ligation     Patient Active Problem List   Diagnosis Date Noted  . Nasal congestion 04/13/2020  . Bacterial vaginosis 08/16/2019  . Contact dermatitis 08/16/2019  . Respiratory complication 06/25/2019  . Right sided abdominal pain 06/24/2019  . Viral respiratory illness 03/24/2019  . Irregular menstrual cycle 06/03/2018  . Cervical stenosis of spine 09/05/2017  . Elevated BP 09/29/2015  . Healthcare maintenance 09/29/2015  . Paresthesia of hand 09/09/2014  . Cough 08/02/2014  . Acute bronchitis 07/13/2014  . Plantar fasciitis, bilateral 12/30/2013  . Back pain 02/03/2013  . Acid reflux 02/03/2013  . Unspecified constipation 02/03/2013  . Fatigue 12/10/2011  . Muscle spasm 12/04/2011  . Anxiety and depression 11/08/2011  . Osteoarthritis of right knee 06/08/2011  . Migraines 06/04/2011  . Obesity 06/04/2011  . Anemia 06/01/2011    Past Surgical History:  Procedure Laterality Date  .  BREAST SURGERY     breast reduction  . CESAREAN SECTION    . TUBAL LIGATION      OB History    Gravida  3   Para  3   Term      Preterm      AB      Living        SAB      IAB      Ectopic      Multiple      Live Births               Home Medications    Prior to Admission medications   Medication Sig Start Date End Date Taking? Authorizing Provider  albuterol (VENTOLIN HFA) 108 (90 Base) MCG/ACT inhaler Inhale 2 puffs into the lungs every 4 (four) hours as needed for wheezing or shortness of breath. 04/13/20  Yes Meccariello, Solmon Ice, DO  amoxicillin (AMOXIL) 875 MG tablet Take 1 tablet (875 mg total) by mouth 2 (two) times daily for 7 days. 04/23/20 04/30/20 Yes Mickie Bail, NP  cetirizine (ZYRTEC) 10 MG tablet TAKE 1 TABLET BY MOUTH EVERY DAY Patient not taking: Reported on 03/30/2020 06/25/19   Jackelyn Poling, DO  fluticasone (FLOVENT DISKUS) 50 MCG/BLIST diskus inhaler Inhale 1 puff into the lungs 2 (two) times daily. Patient not taking: Reported on 03/30/2020 06/24/19   Oralia Manis, DO  gabapentin (NEURONTIN) 100 MG capsule TAKE 1 CAPSULE BY MOUTH 3 TIMES DAILY AS NEEDED (NERVE PAIN). Patient not taking: Reported  on 03/30/2020 06/02/18   Tillman Sers, DO  HYDROCORTISONE, TOPICAL, 2 % LOTN Apply 1 Tube topically in the morning and at bedtime. Do not use more than 14 days. Patient not taking: Reported on 03/30/2020 08/06/19   Katha Cabal, DO  ibuprofen (ADVIL) 800 MG tablet TAKE 1 TABLET BY MOUTH EVERY 8 HOURS AS NEEDED. Patient not taking: Reported on 03/30/2020 03/23/19   Brock Bad, MD  ipratropium (ATROVENT) 0.03 % nasal spray Place 2 sprays into both nostrils every 12 (twelve) hours. Patient not taking: Reported on 03/30/2020 06/02/19   Oralia Manis, DO  loratadine-pseudoephedrine (CLARITIN-D 24 HOUR) 10-240 MG 24 hr tablet Take 1 tablet by mouth daily. Patient not taking: Reported on 03/30/2020 06/01/19   Oralia Manis, DO    Family History Family  History  Problem Relation Age of Onset  . Hypertension Mother   . Heart disease Father   . Hyperlipidemia Father     Social History Social History   Tobacco Use  . Smoking status: Never Smoker  . Smokeless tobacco: Never Used  Substance Use Topics  . Alcohol use: No    Alcohol/week: 2.0 standard drinks    Types: 2 Glasses of wine per week    Comment: occasional  . Drug use: No     Allergies   Sulfa drugs cross reactors   Review of Systems Review of Systems  Constitutional: Positive for fever. Negative for chills.  HENT: Positive for congestion and rhinorrhea. Negative for ear pain and sore throat.   Eyes: Negative for pain and visual disturbance.  Respiratory: Positive for cough, chest tightness and shortness of breath.   Cardiovascular: Negative for chest pain and palpitations.  Gastrointestinal: Negative for abdominal pain, diarrhea and vomiting.  Genitourinary: Negative for dysuria and hematuria.  Musculoskeletal: Negative for arthralgias and back pain.  Skin: Negative for color change and rash.  Neurological: Positive for headaches. Negative for seizures and syncope.  All other systems reviewed and are negative.    Physical Exam Triage Vital Signs ED Triage Vitals  Enc Vitals Group     BP      Pulse      Resp      Temp      Temp src      SpO2      Weight      Height      Head Circumference      Peak Flow      Pain Score      Pain Loc      Pain Edu?      Excl. in GC?    No data found.  Updated Vital Signs BP (!) 156/89 (BP Location: Left Arm)   Pulse 83   Temp 98.2 F (36.8 C) (Oral)   Resp 18   Ht 5\' 7"  (1.702 m)   Wt 256 lb (116.1 kg)   LMP 04/23/2020   SpO2 100%   BMI 40.10 kg/m   Visual Acuity Right Eye Distance:   Left Eye Distance:   Bilateral Distance:    Right Eye Near:   Left Eye Near:    Bilateral Near:     Physical Exam Vitals and nursing note reviewed.  Constitutional:      General: She is not in acute distress.     Appearance: She is well-developed and well-nourished.  HENT:     Head: Normocephalic and atraumatic.     Right Ear: Tympanic membrane normal.     Left Ear: Tympanic membrane normal.  Nose: Congestion and rhinorrhea present.     Mouth/Throat:     Mouth: Mucous membranes are moist.     Pharynx: Oropharynx is clear.  Eyes:     Conjunctiva/sclera: Conjunctivae normal.  Cardiovascular:     Rate and Rhythm: Normal rate and regular rhythm.     Heart sounds: Normal heart sounds.  Pulmonary:     Effort: Pulmonary effort is normal. No respiratory distress.     Breath sounds: Normal breath sounds. No wheezing or rhonchi.     Comments: Diminished breath sounds.  Abdominal:     Palpations: Abdomen is soft.     Tenderness: There is no abdominal tenderness. There is no guarding or rebound.  Musculoskeletal:        General: No edema.     Cervical back: Neck supple.  Skin:    General: Skin is warm and dry.  Neurological:     General: No focal deficit present.     Mental Status: She is alert and oriented to person, place, and time.     Gait: Gait normal.  Psychiatric:        Mood and Affect: Mood and affect and mood normal.        Behavior: Behavior normal.      UC Treatments / Results  Labs (all labs ordered are listed, but only abnormal results are displayed) Labs Reviewed  SARS CORONAVIRUS 2 (TAT 6-24 HRS)    EKG   Radiology DG Chest 2 View  Result Date: 04/23/2020 CLINICAL DATA:  50 year old female with cough EXAM: CHEST - 2 VIEW COMPARISON:  06/14/2019 FINDINGS: Cardiomediastinal silhouette unchanged in size and contour. No pneumothorax or pleural effusion. No confluent airspace disease. No interlobular septal thickening. No displaced fracture.  Mild degenerative changes of the spine IMPRESSION: Negative for acute cardiopulmonary disease Electronically Signed   By: Gilmer Mor D.O.   On: 04/23/2020 12:48    Procedures Procedures (including critical care  time)  Medications Ordered in UC Medications - No data to display  Initial Impression / Assessment and Plan / UC Course  I have reviewed the triage vital signs and the nursing notes.  Pertinent labs & imaging results that were available during my care of the patient were reviewed by me and considered in my medical decision making (see chart for details).   Acute sinusitis, URI. Elevated blood pressure.  Treating with amoxicillin.  COVID pending.  Instructed patient to self quarantine until the test results are back.  Discussed symptomatic treatment including Tylenol, rest, hydration.  Instructed patient to follow up with PCP if her symptoms are not improving.  Discussed with patient that her blood pressure is elevated today and needs to be rechecked by her PCP in 2 to 4 weeks.  Patient agrees to plan of care.    Final Clinical Impressions(s) / UC Diagnoses   Final diagnoses:  Acute non-recurrent frontal sinusitis  Upper respiratory tract infection, unspecified type  Elevated blood pressure reading     Discharge Instructions     Take the amoxicillin as directed.    Your COVID test is pending.  You should self quarantine until the test result is back.    Take Tylenol or ibuprofen as needed for fever or discomfort.  Rest and keep yourself hydrated.    Your blood pressure is elevated today at 156/89.  Please have this rechecked by your primary care provider in 2-4 weeks.         ED Prescriptions    Medication Sig  Dispense Auth. Provider   amoxicillin (AMOXIL) 875 MG tablet Take 1 tablet (875 mg total) by mouth 2 (two) times daily for 7 days. 14 tablet Mickie Bail, NP     PDMP not reviewed this encounter.   Mickie Bail, NP 04/23/20 1302

## 2020-04-23 NOTE — ED Triage Notes (Signed)
Pt presents today with cough, congestion, tightness in chest, fever and SOB x 2 weeks. She reports getting Covid symptoms after received Covid vaccine 2 wks ago. Recently tested 04/13/20 for Covid and was negative.

## 2020-04-23 NOTE — ED Notes (Signed)
Patient transported to X-ray 

## 2020-04-23 NOTE — Discharge Instructions (Addendum)
Take the amoxicillin as directed.    Your COVID test is pending.  You should self quarantine until the test result is back.    Take Tylenol or ibuprofen as needed for fever or discomfort.  Rest and keep yourself hydrated.    Your blood pressure is elevated today at 156/89.  Please have this rechecked by your primary care provider in 2-4 weeks.

## 2020-04-27 ENCOUNTER — Other Ambulatory Visit: Payer: Self-pay | Admitting: Family Medicine

## 2020-04-29 ENCOUNTER — Other Ambulatory Visit: Payer: Self-pay

## 2020-04-29 ENCOUNTER — Ambulatory Visit (INDEPENDENT_AMBULATORY_CARE_PROVIDER_SITE_OTHER): Payer: 59 | Admitting: Family Medicine

## 2020-04-29 ENCOUNTER — Telehealth (INDEPENDENT_AMBULATORY_CARE_PROVIDER_SITE_OTHER): Payer: 59 | Admitting: Family Medicine

## 2020-04-29 DIAGNOSIS — B9789 Other viral agents as the cause of diseases classified elsewhere: Secondary | ICD-10-CM | POA: Diagnosis not present

## 2020-04-29 DIAGNOSIS — J988 Other specified respiratory disorders: Secondary | ICD-10-CM

## 2020-04-29 DIAGNOSIS — R0981 Nasal congestion: Secondary | ICD-10-CM

## 2020-04-29 MED ORDER — BENZONATATE 200 MG PO CAPS: 200.0000 mg | ORAL_CAPSULE | Freq: Two times a day (BID) | ORAL | 0 refills | Status: DC | PRN

## 2020-04-29 NOTE — Patient Instructions (Signed)
Thank you for coming to see me today. It was a pleasure. Today we discussed your chest infection. I do not think we need to treat it further today. Please continue the inhalers, fluids, tessalon pearls etc.  Please follow-up with your PCP if no improvement.   If you have any questions or concerns, please do not hesitate to call the office at [phone].  If you develop fevers>100.5, shortness of breath, chest pain, palpitations, dizziness, abdominal pain, nausea, vomiting, diarrhea or cannot eat or drink then please go to the ER immediately.  Best wishes,   Dr Allena Katz

## 2020-04-29 NOTE — Progress Notes (Signed)
[facility] Telemedicine Visit  Patient consented to have virtual visit and was identified by name and date of birth. Method of visit: Video  Encounter participants: Patient: Christina Sparks - located at home Provider: Lavonda Jumbo - located at home office Others (if applicable): N/a  Chief Complaint: Cough, congestion  HPI:  [name] is a 50 yo F who is experiencing cough and congestion for 2 weeks. Patient states symptoms began 2 days after receiving COVID vaccine on 1/5. She had chills, cough, fever, and body aches. Most symptoms improved but continues to have cough and congestion. Associated mucus production that is yellow in color. Recently seen in urgent care and was prescribed 7 days of amoxicillin. Today is her last day of antibiotics. She feels as if the congestion has "broken up" some. She is also using afrin spray and sinex for nasal congestion which is helpful but creates postnasal drip making her cough more. She has also tried mucinex, nyquil, and honey. States she does not take fluticasone or atrovent (which are on her medication list). She is concern that the spot on her lung has returned and would like CT imaging.   ROS: per HPI  Pertinent PMHx: Nasal congestion, cough, anxiety/depression  Exam:  LMP 04/23/2020   Respiratory: Coughing, audible nasal congestion with speech. Speaking in full sentences.   Assessment/Plan:  Nasal congestion Ongoing per patient reported since 1/7. Seen in office 1/19 and supportive care for URI discussed and COVID negative. Seen in urgent care 1/29 and subsequently treated for sinus infection with 7 days of amoxicillin. Today is day 7 of 7. Continues with lingering symptoms of congestion and cough. Patient concerned for possible reoccurrence of lung infection that was noted on Chest CT 05/2019. Review most recent CXR 1/29 and was without acute findings. Given that patient admits to taking supportive care measures  and recent antibiotic treatment [facility] appt scheduled today at 1:50pm for further evaluation with vitals and physical exam. Patient agreed with this plan. Consider restarting atrovent, fluticasone, and albuterol as need. Suspect cough precipitated by post nasal drip.     Time spent during visit with patient: 20 minutes

## 2020-04-29 NOTE — Progress Notes (Addendum)
     SUBJECTIVE:   CHIEF COMPLAINT / HPI:   Christina Sparks is a 50 y.o. female presents to [facility] for cough   Primary symptom: productive cough producing green sputum Duration: 3 weeks  Associated symptoms: congestion, dyspnea, headache Fever? Tmax?: 103 early Jan after 1st dose of covid vaccine, no recent fevers Sick contacts: none  Covid test: negative on 04/13/20, 03/2920 Covid vaccination(s): 03/30/20 Pfizer Recently treated with amoxicillin for acute sinusitis at recent urgent care visit, her last dose today. She reports similar sx last year and had to go to the ER. They gave her doxycycline and prednisone which cleared her infection. She says amoxicillin does not help her. Assoc sx: dyspnea, wheezing and hurting in back when she breathes in. Able to tolerate PO. Pt has never smoked.  PERTINENT  PMH / PSH: Migraines, acid reflux, OA  OBJECTIVE:   BP 139/85   Pulse 80   Ht 5\' 7"  (1.702 m)   LMP 04/23/2020   SpO2 98%   BMI 40.10 kg/m    General: Alert, no acute distress, well appearing Cardio: Normal S1 and S2, RRR, no r/m/g Pulm: mild expiratory wheeze, good AE bilaterally, normal work of breathing Extremities: No peripheral edema.  Neuro: Cranial nerves grossly intact   ASSESSMENT/PLAN:   Viral respiratory illness Symptoms are most likely related to acute viral illness. Low suspicion for COVID as she has had 2 negative tests recently. She is finishing a course of amoxicillin today. Pt would like another course abx such as doxycycline as she said this has cleared a similar infection she had last year. I explained to pt that given that she is just finishing a course of abx, we should watch and wait for her sx to improve and that the lingering cough will improve with time. In the mean time I recommended OTC cough remedies, honey and prescribed tessalon pearls for the cough. Return precautions provided. Follow up with PCP in 1-2 weeks. Could consider a course of  doxycycline/azithromycin at this point if persistent sx. Could consider referral for PFTs and referral to pulmonology given recurrent URTIs.     04/25/2020, MD PGY-2 [facility]

## 2020-04-29 NOTE — Assessment & Plan Note (Signed)
Ongoing per patient reported since 1/7. Seen in office 1/19 and supportive care for URI discussed and COVID negative. Seen in urgent care 1/29 and subsequently treated for sinus infection with 7 days of amoxicillin. Today is day 7 of 7. Continues with lingering symptoms of congestion and cough. Patient concerned for possible reoccurrence of lung infection that was noted on Chest CT 05/2019. Review most recent CXR 1/29 and was without acute findings. Given that patient admits to taking supportive care measures and recent antibiotic treatment [facility] appt scheduled today at 1:50pm for further evaluation with vitals and physical exam. Patient agreed with this plan. Consider restarting atrovent, fluticasone, and albuterol as need. Suspect cough precipitated by post nasal drip.

## 2020-04-30 NOTE — Assessment & Plan Note (Addendum)
Symptoms are most likely related to acute viral illness. Low suspicion for COVID as she has had 2 negative tests recently. She is finishing a course of amoxicillin today. Pt would like another course abx such as doxycycline as she said this has cleared a similar infection she had last year. I explained to pt that given that she is just finishing a course of abx, we should watch and wait for her sx to improve and that the lingering cough will improve with time. In the mean time I recommended OTC cough remedies, honey and prescribed tessalon pearls for the cough. Return precautions provided. Follow up with PCP in 1-2 weeks. Could consider a course of doxycycline/azithromycin at this point if persistent sx. Could consider referral for PFTs and referral to pulmonology given recurrent URTIs.

## 2020-05-04 ENCOUNTER — Telehealth: Payer: Self-pay

## 2020-05-04 NOTE — Telephone Encounter (Signed)
Patient calls nurse line stating she was seen on 02/4 and was told an inhaler would be sent to her pharmacy along with tessalon pearls. Patient reports she picked up the pears, however the inhaler was not there. I do not see where one was called in. Patient reports she has used her refill from 01/19 up. Will forward to provider who saw patient on 02/04.

## 2020-05-05 ENCOUNTER — Other Ambulatory Visit: Payer: Self-pay | Admitting: Family Medicine

## 2020-05-05 MED ORDER — ALBUTEROL SULFATE HFA 108 (90 BASE) MCG/ACT IN AERS: 2.0000 | INHALATION_SPRAY | RESPIRATORY_TRACT | 0 refills | Status: DC | PRN

## 2020-05-05 NOTE — Telephone Encounter (Signed)
Sent in refill for albuterol, thank you. Pt should follow up with PCP for further refills.

## 2020-05-10 ENCOUNTER — Telehealth: Payer: Self-pay

## 2020-05-10 NOTE — Telephone Encounter (Signed)
I think it is alright for patient to be seen in regular clinic though the provider should use Covid precautions as this patient could be positive for Covid since her most recent test. If this is appropriate per our clinic's current protocols based off the above then I have no concerns with the patient being seen outside of regular clinic.

## 2020-05-10 NOTE — Telephone Encounter (Signed)
Attempted to reach pt. No answer. LVM for pt to call the office to make appt. I informed that Dr. Atha Starks does not have an opening until 3/4 . If pts wants to be seen sooner that it would have to be with a different provider. Dr. Atha Starks did give the ok for pt to be seen on regular clinic for coughing as long as its ok with the clinic protocol and whatever provider pt may see that day says that they are ok with it as well. Aquilla Solian, CMA

## 2020-05-10 NOTE — Telephone Encounter (Signed)
Pt calling requesting to see her PCP bc this cough and shortness of breath has been going on too long. Pt states has been covid tested 3x's in [facility] and results have been negative, please advise if ok to be seen in regular clinic

## 2020-05-10 NOTE — Telephone Encounter (Signed)
Routed to PCP. Christina Sparks, CMA  

## 2020-05-11 NOTE — Telephone Encounter (Signed)
Spoke with pt to see if she would see Dr. Obie Dredge on 2/22. Since 2/18 spot was already taken that we had initially talked about. Pt said no that she would just go to the ED. Aquilla Solian, CMA

## 2020-05-26 ENCOUNTER — Other Ambulatory Visit: Payer: Self-pay | Admitting: Family Medicine

## 2020-08-11 ENCOUNTER — Emergency Department (HOSPITAL_COMMUNITY)
Admission: EM | Admit: 2020-08-11 | Discharge: 2020-08-12 | Disposition: A | Discharge status: Home / Self Care | Payer: 59 | Attending: Emergency Medicine | Admitting: Emergency Medicine

## 2020-08-11 ENCOUNTER — Emergency Department (HOSPITAL_COMMUNITY): Payer: 59

## 2020-08-11 ENCOUNTER — Encounter (HOSPITAL_COMMUNITY): Payer: Self-pay | Admitting: Pharmacy Technician

## 2020-08-11 ENCOUNTER — Other Ambulatory Visit: Payer: Self-pay

## 2020-08-11 DIAGNOSIS — R059 Cough, unspecified: Secondary | ICD-10-CM | POA: Diagnosis present

## 2020-08-11 DIAGNOSIS — J209 Acute bronchitis, unspecified: Secondary | ICD-10-CM | POA: Insufficient documentation

## 2020-08-11 DIAGNOSIS — J208 Acute bronchitis due to other specified organisms: Secondary | ICD-10-CM

## 2020-08-11 LAB — CBC
HCT: 38.5 % (ref 36.0–46.0)
Hemoglobin: 12.1 g/dL (ref 12.0–15.0)
MCH: 25.2 pg — ABNORMAL LOW (ref 26.0–34.0)
MCHC: 31.4 g/dL (ref 30.0–36.0)
MCV: 80 fL (ref 80.0–100.0)
Platelets: 299 10*3/uL (ref 150–400)
RBC: 4.81 MIL/uL (ref 3.87–5.11)
RDW: 14.5 % (ref 11.5–15.5)
WBC: 8.8 10*3/uL (ref 4.0–10.5)
nRBC: 0 % (ref 0.0–0.2)

## 2020-08-11 LAB — BASIC METABOLIC PANEL
Anion gap: 6 (ref 5–15)
BUN: 13 mg/dL (ref 6–20)
CO2: 26 mmol/L (ref 22–32)
Calcium: 8.8 mg/dL — ABNORMAL LOW (ref 8.9–10.3)
Chloride: 104 mmol/L (ref 98–111)
Creatinine, Ser: 0.66 mg/dL (ref 0.44–1.00)
GFR, Estimated: >60 mL/min (ref >60)
Glucose, Bld: 90 mg/dL (ref 70–99)
Potassium: 3.5 mmol/L (ref 3.5–5.1)
Sodium: 136 mmol/L (ref 135–145)

## 2020-08-11 LAB — I-STAT BETA HCG BLOOD, ED (MC, WL, AP ONLY): I-stat hCG, quantitative: <5 m[IU]/mL (ref <5)

## 2020-08-11 LAB — TROPONIN I (HIGH SENSITIVITY)
Troponin I (High Sensitivity): <2 ng/L (ref <18)
Troponin I (High Sensitivity): <2 ng/L (ref <18)

## 2020-08-11 MED ORDER — IPRATROPIUM BROMIDE HFA 17 MCG/ACT IN AERS
2.0000 | INHALATION_SPRAY | Freq: Once | RESPIRATORY_TRACT | Status: AC
  Administered 2020-08-12: 2 via RESPIRATORY_TRACT
  Filled 2020-08-11: qty 12.9

## 2020-08-11 MED ORDER — ALBUTEROL SULFATE HFA 108 (90 BASE) MCG/ACT IN AERS
4.0000 | INHALATION_SPRAY | Freq: Once | RESPIRATORY_TRACT | Status: AC
  Administered 2020-08-12: 4 via RESPIRATORY_TRACT
  Filled 2020-08-11: qty 6.7

## 2020-08-11 MED ORDER — METHYLPREDNISOLONE SODIUM SUCC 125 MG IJ SOLR: 125.0000 mg | Freq: Once | INTRAMUSCULAR | Status: DC

## 2020-08-11 NOTE — ED Triage Notes (Signed)
Pt here with reports of tightness in chest with cough. Pt reports cough has been going on for a few months. Pt now with pain to L side. Pt told to come here for CT scan.

## 2020-08-11 NOTE — ED Provider Notes (Signed)
[facility] Provider Note   CSN: [id number] Arrival date & time: 08/11/20  1817     History Chief Complaint  Patient presents with  . Cough    Christina Sparks is a 50 y.o. female.  The history is provided by the patient.  Cough Cough characteristics:  Non-productive Sputum characteristics:  Nondescript Severity:  Mild Onset quality:  Gradual Timing:  Intermittent Progression:  Waxing and waning Chronicity:  Recurrent Smoker: yes   Relieved by:  Nothing Worsened by:  Nothing Associated symptoms: sinus congestion and wheezing   Associated symptoms: no chest pain, no chills, no ear pain, no fever, no rash, no shortness of breath and no sore throat        Past Medical History:  Diagnosis Date  . Anemia   . Back pain   . Knee pain   . Migraines   . S/P bilateral breast reduction   . S/P tubal ligation     Patient Active Problem List   Diagnosis Date Noted  . Nasal congestion 04/13/2020  . Bacterial vaginosis 08/16/2019  . Contact dermatitis 08/16/2019  . Respiratory complication 06/25/2019  . Right sided abdominal pain 06/24/2019  . Viral respiratory illness 03/24/2019  . Irregular menstrual cycle 06/03/2018  . Cervical stenosis of spine 09/05/2017  . Elevated BP 09/29/2015  . Healthcare maintenance 09/29/2015  . Paresthesia of hand 09/09/2014  . Cough 08/02/2014  . Acute bronchitis 07/13/2014  . Plantar fasciitis, bilateral 12/30/2013  . Back pain 02/03/2013  . Acid reflux 02/03/2013  . Unspecified constipation 02/03/2013  . Fatigue 12/10/2011  . Muscle spasm 12/04/2011  . Anxiety and depression 11/08/2011  . Osteoarthritis of right knee 06/08/2011  . Migraines 06/04/2011  . Obesity 06/04/2011  . Anemia 06/01/2011    Past Surgical History:  Procedure Laterality Date  . BREAST SURGERY     breast reduction  . CESAREAN SECTION    . TUBAL LIGATION       OB History    Gravida  3   Para  3   Term       Preterm      AB      Living        SAB      IAB      Ectopic      Multiple      Live Births              Family History  Problem Relation Age of Onset  . Hypertension Mother   . Heart disease Father   . Hyperlipidemia Father     Social History   Tobacco Use  . Smoking status: Never Smoker  . Smokeless tobacco: Never Used  Substance Use Topics  . Alcohol use: No    Alcohol/week: 2.0 standard drinks    Types: 2 Glasses of wine per week    Comment: occasional  . Drug use: No    Home Medications Prior to Admission medications   Medication Sig Start Date End Date Taking? Authorizing Provider  azithromycin (ZITHROMAX) 250 MG tablet Take 1 tablet (250 mg total) by mouth daily. Take first 2 tablets together, then 1 every day until finished. 08/12/20  Yes Mindie Rawdon, DO  predniSONE (DELTASONE) 20 MG tablet Take 3 tablets (60 mg total) by mouth daily for 5 days. 08/12/20 08/17/20 Yes Grecia Lynk, DO  albuterol (VENTOLIN HFA) 108 (90 Base) MCG/ACT inhaler INHALE 2 PUFFS INTO THE LUNGS EVERY 4 HOURS AS NEEDED FOR WHEEZING  OR SHORTNESS OF BREATH. 05/26/20   Jackelyn Poling, DO  benzonatate (TESSALON) 200 MG capsule Take 1 capsule (200 mg total) by mouth 2 (two) times daily as needed for cough. 04/29/20   Towanda Octave, MD    Allergies    Sulfa drugs cross reactors  Review of Systems   Review of Systems  Constitutional: Negative for chills and fever.  HENT: Negative for ear pain and sore throat.   Eyes: Negative for pain and visual disturbance.  Respiratory: Positive for cough and wheezing. Negative for shortness of breath.   Cardiovascular: Negative for chest pain and palpitations.  Gastrointestinal: Negative for abdominal pain and vomiting.  Genitourinary: Negative for dysuria and hematuria.  Musculoskeletal: Negative for arthralgias and back pain.  Skin: Negative for color change and rash.  Neurological: Negative for seizures and syncope.  All other systems  reviewed and are negative.   Physical Exam Updated Vital Signs  ED Triage Vitals  Enc Vitals Group     BP 08/11/20 1856 137/86     Pulse Rate 08/11/20 1856 82     Resp 08/11/20 1856 16     Temp 08/11/20 1856 98.2 F (36.8 C)     Temp Source 08/11/20 1856 Oral     SpO2 08/11/20 1856 98 %     Weight 08/11/20 1856 259 lb (117.5 kg)     Height 08/11/20 1856 5\' 7"  (1.702 m)     Head Circumference --      Peak Flow --      Pain Score --      Pain Loc --      Pain Edu? --      Excl. in GC? --     Physical Exam Vitals and nursing note reviewed.  Constitutional:      General: She is not in acute distress.    Appearance: She is well-developed. She is not ill-appearing.  HENT:     Head: Normocephalic and atraumatic.     Nose: Nose normal.     Mouth/Throat:     Mouth: Mucous membranes are moist.  Eyes:     Extraocular Movements: Extraocular movements intact.     Conjunctiva/sclera: Conjunctivae normal.     Pupils: Pupils are equal, round, and reactive to light.  Cardiovascular:     Rate and Rhythm: Normal rate and regular rhythm.     Pulses: Normal pulses.     Heart sounds: Normal heart sounds. No murmur heard.   Pulmonary:     Effort: Pulmonary effort is normal. No respiratory distress.     Breath sounds: Wheezing present.  Abdominal:     Palpations: Abdomen is soft.     Tenderness: There is no abdominal tenderness.  Musculoskeletal:     Cervical back: Normal range of motion and neck supple.  Skin:    General: Skin is warm and dry.     Capillary Refill: Capillary refill takes less than 2 seconds.  Neurological:     General: No focal deficit present.     Mental Status: She is alert.  Psychiatric:        Mood and Affect: Mood normal.     ED Results / Procedures / Treatments   Labs (all labs ordered are listed, but only abnormal results are displayed) Labs Reviewed  BASIC METABOLIC PANEL - Abnormal; Notable for the following components:      Result Value    Calcium 8.8 (*)    All other components within normal limits  CBC - Abnormal;  Notable for the following components:   MCH 25.2 (*)    All other components within normal limits  I-STAT BETA HCG BLOOD, ED ([facility], [facility], [facility] ONLY)  TROPONIN I (HIGH SENSITIVITY)  TROPONIN I (HIGH SENSITIVITY)    EKG EKG Interpretation  Date/Time:  Thursday Aug 11 2020 18:54:56 EDT Ventricular Rate:  77 PR Interval:  150 QRS Duration: 138 QT Interval:  390 QTC Calculation: 441 R Axis:   40 Text Interpretation: Normal sinus rhythm Right bundle branch block Abnormal ECG Confirmed by Virgina Norfolk ([id number]) on 08/11/2020 11:00:32 PM   Radiology DG Chest 2 View  Result Date: 08/11/2020 CLINICAL DATA:  Chest pain and cough, initial encounter EXAM: CHEST - 2 VIEW COMPARISON:  None. FINDINGS: The heart size and mediastinal contours are within normal limits. Both lungs are clear. The visualized skeletal structures are unremarkable. IMPRESSION: No active cardiopulmonary disease. Electronically Signed   By: Alcide Clever M.D.   On: 08/11/2020 19:43   CT Chest Wo Contrast  Result Date: 08/11/2020 CLINICAL DATA:  Chest tightness and cough for several months, now with left-sided pain EXAM: CT CHEST WITHOUT CONTRAST TECHNIQUE: Multidetector CT imaging of the chest was performed following the standard protocol without IV contrast. COMPARISON:  Radiograph 08/11/2020, 04/23/2020, CT 06/14/2019 FINDINGS: Cardiovascular: Cardiac size within normal limits. No sizable pericardial effusion. Trace fluid in the pericardial recesses is likely within physiologic normal. The aortic root is suboptimally assessed given cardiac pulsation artifact. The aorta is normal caliber. Normal 3 vessel branching of the aortic arch. Central pulmonary arteries are normal caliber. No major venous abnormalities. Luminal assessment of the vasculature is precluded in the absence of contrast media. Mediastinum/Nodes: No mediastinal fluid or gas. Normal thyroid gland  and thoracic inlet. No acute abnormality of the trachea or esophagus. Few scattered subcentimeter lymph nodes in the mediastinum are not significantly changed from prior. No worrisome enlarged or enlarging mediastinal or axillary adenopathy. Hilar nodal evaluation is limited in the absence of intravenous contrast media. Lungs/Pleura: There is diffuse mild airways thickening and some scattered secretions which could reflect an acute or chronic bronchitic change. Resolution of previously seen patchy opacities in the lungs. No new consolidative process is seen. No convincing CT features of edema. No pneumothorax or effusion. No worrisome pulmonary nodules or masses. Mosaic attenuation present on the comparison prior is less apparent on this exam likely reflect changes related to imaging during exhalation on the prior study. Upper Abdomen: Calcified gallstone within the gallbladder partially visualized on this exam. No visible adjacent inflammation within the margins of imaging. No other acute or conspicuous upper abdominal abnormality. Musculoskeletal: Multilevel degenerative changes are present in the imaged portions of the spine. No acute osseous abnormality or suspicious osseous lesion. No worrisome chest wall masses or lesions IMPRESSION: Some mild airways thickening and scattered secretions could reflect acute or chronic bronchitic change particularly in the given clinical setting. Resolution of previously seen patchy subpleural opacities comparison exam. No new acute consolidative process is seen. No other acute or significant cardiopulmonary abnormalities. Electronically Signed   By: Kreg Shropshire M.D.   On: 08/11/2020 23:46    Procedures Procedures   Medications Ordered in ED Medications  albuterol (VENTOLIN HFA) 108 (90 Base) MCG/ACT inhaler 4 puff (has no administration in time range)  ipratropium (ATROVENT HFA) inhaler 2 puff (has no administration in time range)  predniSONE (DELTASONE) tablet 60 mg  (has no administration in time range)    ED Course  I have reviewed the triage vital signs  and the nursing notes.  Pertinent labs & imaging results that were available during my care of the patient were reviewed by me and considered in my medical decision making (see chart for details).    MDM Rules/Calculators/A&P                          SHAVONDA WIEDMAN is here with cough.  Normal vitals.  No fever.  Has had on and off cough for the last 6 to 7 months.  Has been using inhaler with some improvement.  No history of asthma.  Has had a bad cough with wheezing.  Chest x-ray shows no pneumonia.  CT scan showed some mild airway thickening and scattered secretions which likely reflect an acute bronchitis.  Similar to what she is had in the past.  No obvious pneumonia.  No concern for PE.  Troponins negative x2 and unremarkable EKG.  Doubt cardiac process.  Overall suspect reactive airway process.  Will give albuterol, steroids, Z-Pak and have her follow-up with pulmonology given multiple events here over the last half a year with no history of reactive airway disease.  Discharged in good condition.  This chart was dictated using voice recognition software.  Despite best efforts to proofread,  errors can occur which can change the documentation meaning.    Final Clinical Impression(s) / ED Diagnoses Final diagnoses:  Acute bronchitis due to other specified organisms    Rx / DC Orders ED Discharge Orders         Ordered    predniSONE (DELTASONE) 20 MG tablet  Daily        08/12/20 0006    azithromycin (ZITHROMAX) 250 MG tablet  Daily        08/12/20 0006           Virgina Norfolk, DO 08/12/20 0011

## 2020-08-12 MED ORDER — PREDNISONE 20 MG PO TABS: 60.0000 mg | ORAL_TABLET | Freq: Every day | ORAL | 0 refills | Status: AC

## 2020-08-12 MED ORDER — PREDNISONE 20 MG PO TABS
60.0000 mg | ORAL_TABLET | Freq: Once | ORAL | Status: AC
  Administered 2020-08-12: 60 mg via ORAL
  Filled 2020-08-12: qty 3

## 2020-08-12 MED ORDER — AZITHROMYCIN 250 MG PO TABS: 250.0000 mg | ORAL_TABLET | Freq: Every day | ORAL | 0 refills | Status: DC

## 2020-08-12 NOTE — ED Notes (Signed)
All appropriate discharge materials reviewed at length with patient. Time for questions provided. Pt has no other questions at this time and verbalizes understanding of all provided materials.  

## 2020-10-18 ENCOUNTER — Institutional Professional Consult (permissible substitution): Payer: 59 | Admitting: Pulmonary Disease

## 2020-10-18 NOTE — Progress Notes (Deleted)
Synopsis: Referred in July 2022 for COPD by Jackelyn Poling, DO  Subjective:   PATIENT ID: Christina Sparks GENDER: female DOB: Mar 19, 1971, MRN: [id number]   HPI  No chief complaint on file.   Christina Sparks is a 50 year old woman, never smoker who is referred to pulmonary clinic for COPD.    Past Medical History:  Diagnosis Date   Anemia    Back pain    Knee pain    Migraines    S/P bilateral breast reduction    S/P tubal ligation      Family History  Problem Relation Age of Onset   Hypertension Mother    Heart disease Father    Hyperlipidemia Father      Social History   Socioeconomic History   Marital status: Single    Spouse name: Not on file   Number of children: Not on file   Years of education: Not on file   Highest education level: Not on file  Occupational History   Not on file  Tobacco Use   Smoking status: Never   Smokeless tobacco: Never  Substance and Sexual Activity   Alcohol use: No    Alcohol/week: 2.0 standard drinks    Types: 2 Glasses of wine per week    Comment: occasional   Drug use: No   Sexual activity: Yes    Partners: Male    Birth control/protection: Surgical  Other Topics Concern   Not on file  Social History Narrative   Not on file   Social Determinants of Health   Financial Resource Strain: Not on file  Food Insecurity: Not on file  Transportation Needs: Not on file  Physical Activity: Not on file  Stress: Not on file  Social Connections: Not on file  Intimate Partner Violence: Not on file     Allergies  Allergen Reactions   Sulfa Drugs Cross Reactors Nausea And Vomiting and Swelling     Outpatient Medications Prior to Visit  Medication Sig Dispense Refill   albuterol (VENTOLIN HFA) 108 (90 Base) MCG/ACT inhaler INHALE 2 PUFFS INTO THE LUNGS EVERY 4 HOURS AS NEEDED FOR WHEEZING OR SHORTNESS OF BREATH. 8 g 0   azithromycin (ZITHROMAX) 250 MG tablet Take 1 tablet (250 mg total) by mouth daily. Take first 2 tablets  together, then 1 every day until finished. 6 tablet 0   benzonatate (TESSALON) 200 MG capsule Take 1 capsule (200 mg total) by mouth 2 (two) times daily as needed for cough. 30 capsule 0   No facility-administered medications prior to visit.    Review of Systems  Constitutional:  Negative for chills, fever, malaise/fatigue and weight loss.  HENT:  Negative for congestion, sinus pain and sore throat.   Eyes: Negative.   Respiratory:  Negative for cough, hemoptysis, sputum production, shortness of breath and wheezing.   Cardiovascular:  Negative for chest pain, palpitations, orthopnea, claudication and leg swelling.  Gastrointestinal:  Negative for abdominal pain, heartburn, nausea and vomiting.  Genitourinary: Negative.   Musculoskeletal:  Negative for joint pain and myalgias.  Skin:  Negative for rash.  Neurological:  Negative for weakness.  Endo/Heme/Allergies: Negative.   Psychiatric/Behavioral: Negative.       Objective:  There were no vitals filed for this visit.   Physical Exam Constitutional:      General: She is not in acute distress.    Appearance: She is not ill-appearing.  HENT:     Head: Normocephalic and atraumatic.  Eyes:  General: No scleral icterus.    Conjunctiva/sclera: Conjunctivae normal.     Pupils: Pupils are equal, round, and reactive to light.  Cardiovascular:     Rate and Rhythm: Normal rate and regular rhythm.     Pulses: Normal pulses.     Heart sounds: Normal heart sounds. No murmur heard. Pulmonary:     Effort: Pulmonary effort is normal.     Breath sounds: Normal breath sounds. No wheezing, rhonchi or rales.  Abdominal:     General: Bowel sounds are normal.     Palpations: Abdomen is soft.  Musculoskeletal:     Right lower leg: No edema.     Left lower leg: No edema.  Lymphadenopathy:     Cervical: No cervical adenopathy.  Skin:    General: Skin is warm and dry.  Neurological:     General: No focal deficit present.     Mental  Status: She is alert.  Psychiatric:        Mood and Affect: Mood normal.        Behavior: Behavior normal.        Thought Content: Thought content normal.        Judgment: Judgment normal.      CBC    Component Value Date/Time   WBC 8.8 08/11/2020 1936   RBC 4.81 08/11/2020 1936   HGB 12.1 08/11/2020 1936   HGB 11.3 06/02/2018 1658   HCT 38.5 08/11/2020 1936   HCT 37.3 06/02/2018 1658   PLT 299 08/11/2020 1936   PLT 350 06/02/2018 1658   MCV 80.0 08/11/2020 1936   MCV 70 (L) 06/02/2018 1658   MCH 25.2 (L) 08/11/2020 1936   MCHC 31.4 08/11/2020 1936   RDW 14.5 08/11/2020 1936   RDW 19.0 (H) 06/02/2018 1658   LYMPHSABS 4.2 (H) 06/13/2019 2225   MONOABS 1.0 06/13/2019 2225   EOSABS 1.9 (H) 06/13/2019 2225   BASOSABS 0.1 06/13/2019 2225   BMP Latest Ref Rng & Units 08/11/2020 06/13/2019 09/28/2015  Glucose 70 - 99 mg/dL 90 109(N) 85  BUN 6 - 20 mg/dL 13 5(L) 8  Creatinine 2.35 - 1.00 mg/dL 5.73 2.20 2.54  Sodium 135 - 145 mmol/L 136 141 138  Potassium 3.5 - 5.1 mmol/L 3.5 3.8 4.4  Chloride 98 - 111 mmol/L 104 108 106  CO2 22 - 32 mmol/L 26 26 27   Calcium 8.9 - 10.3 mg/dL ) 2.7(C) 8.6   Chest imaging: CT Chest 08/11/20 Some mild airways thickening and scattered secretions could reflect acute or chronic bronchitic change particularly in the given clinical setting. Resolution of previously seen patchy subpleural opacities comparison exam. No new acute consolidative process is seen.  CTA Chest 06/14/19 1. No findings to suggest acute pulmonary embolism. 2. Few patchy areas of subpleural opacity anterior segment right upper lobe and basal segments of the left and right lower lobes. Appearance is nonspecific and may reflect acute infection or inflammation. 3. Cholelithiasis with no findings to suggest acute cholecystitis.  PFT: No flowsheet data found.    Assessment & Plan:   No diagnosis found.  Discussion: ***    Current Outpatient Medications:     albuterol (VENTOLIN HFA) 108 (90 Base) MCG/ACT inhaler, INHALE 2 PUFFS INTO THE LUNGS EVERY 4 HOURS AS NEEDED FOR WHEEZING OR SHORTNESS OF BREATH., Disp: 8 g, Rfl: 0   azithromycin (ZITHROMAX) 250 MG tablet, Take 1 tablet (250 mg total) by mouth daily. Take first 2 tablets together, then 1 every day until finished., Disp: 6  tablet, Rfl: 0   benzonatate (TESSALON) 200 MG capsule, Take 1 capsule (200 mg total) by mouth 2 (two) times daily as needed for cough., Disp: 30 capsule, Rfl: 0

## 2021-02-11 ENCOUNTER — Ambulatory Visit (HOSPITAL_COMMUNITY)
Admission: EM | Admit: 2021-02-11 | Discharge: 2021-02-11 | Disposition: A | Discharge status: Home / Self Care | Payer: 59 | Attending: Family Medicine | Admitting: Family Medicine

## 2021-02-11 ENCOUNTER — Encounter (HOSPITAL_COMMUNITY): Payer: Self-pay | Admitting: Emergency Medicine

## 2021-02-11 ENCOUNTER — Other Ambulatory Visit: Payer: Self-pay

## 2021-02-11 DIAGNOSIS — R062 Wheezing: Secondary | ICD-10-CM

## 2021-02-11 DIAGNOSIS — R0602 Shortness of breath: Secondary | ICD-10-CM

## 2021-02-11 DIAGNOSIS — J22 Unspecified acute lower respiratory infection: Secondary | ICD-10-CM | POA: Diagnosis not present

## 2021-02-11 MED ORDER — DOXYCYCLINE HYCLATE 100 MG PO CAPS: 100.0000 mg | ORAL_CAPSULE | Freq: Two times a day (BID) | ORAL | 0 refills | Status: DC

## 2021-02-11 MED ORDER — PREDNISONE 20 MG PO TABS: 40.0000 mg | ORAL_TABLET | Freq: Every day | ORAL | 0 refills | Status: DC

## 2021-02-11 MED ORDER — PROMETHAZINE-DM 6.25-15 MG/5ML PO SYRP: 5.0000 mL | ORAL_SOLUTION | Freq: Four times a day (QID) | ORAL | 0 refills | Status: DC | PRN

## 2021-02-11 MED ORDER — DEXAMETHASONE SODIUM PHOSPHATE 10 MG/ML IJ SOLN
10.0000 mg | Freq: Once | INTRAMUSCULAR | Status: AC
  Administered 2021-02-11: 10 mg via INTRAMUSCULAR

## 2021-02-11 MED ORDER — DEXAMETHASONE SODIUM PHOSPHATE 10 MG/ML IJ SOLN
INTRAMUSCULAR | Status: AC
  Filled 2021-02-11: qty 1

## 2021-02-11 MED ORDER — ALBUTEROL SULFATE HFA 108 (90 BASE) MCG/ACT IN AERS: 1.0000 | INHALATION_SPRAY | Freq: Four times a day (QID) | RESPIRATORY_TRACT | 0 refills | Status: DC | PRN

## 2021-02-11 NOTE — ED Triage Notes (Signed)
Pt is present today with SOB, cough, chest congestion,and wheezing. Pt sx started Monday Pt states that she last used her inhaler 

## 2021-02-11 NOTE — ED Provider Notes (Signed)
[facility]    CSN: [id number] Arrival date & time: 02/11/21  1320      History   Chief Complaint Chief Complaint  Patient presents with   Shortness of Breath   Wheezing    Chest tightness     HPI Christina Sparks is a 50 y.o. female.   Presenting today with about a week of progressively worsening cough productive of thick green sputum, chest tightness, shortness of breath, wheezing, fatigue, congestion, scratchy throat.  Denies abdominal pain, nausea vomiting diarrhea, known fevers.  So far taking TheraFlu, albuterol inhaler with minimal relief.  Past history of significant bronchitis episodes.  No known sick contacts recently.   Past Medical History:  Diagnosis Date   Anemia    Back pain    Knee pain    Migraines    S/P bilateral breast reduction    S/P tubal ligation     Patient Active Problem List   Diagnosis Date Noted   Nasal congestion 04/13/2020   Bacterial vaginosis 08/16/2019   Contact dermatitis 08/16/2019   Respiratory complication 06/25/2019   Right sided abdominal pain 06/24/2019   Viral respiratory illness 03/24/2019   Irregular menstrual cycle 06/03/2018   Cervical stenosis of spine 09/05/2017   Elevated BP 09/29/2015   Healthcare maintenance 09/29/2015   Paresthesia of hand 09/09/2014   Cough 08/02/2014   Acute bronchitis 07/13/2014   Plantar fasciitis, bilateral 12/30/2013   Back pain 02/03/2013   Acid reflux 02/03/2013   Unspecified constipation 02/03/2013   Fatigue 12/10/2011   Muscle spasm 12/04/2011   Anxiety and depression 11/08/2011   Osteoarthritis of right knee 06/08/2011   Migraines 06/04/2011   Obesity 06/04/2011   Anemia 06/01/2011    Past Surgical History:  Procedure Laterality Date   BREAST SURGERY     breast reduction   CESAREAN SECTION     TUBAL LIGATION      OB History     Gravida  3   Para  3   Term      Preterm      AB      Living         SAB      IAB      Ectopic      Multiple       Live Births               Home Medications    Prior to Admission medications   Medication Sig Start Date End Date Taking? Authorizing Provider  albuterol (VENTOLIN HFA) 108 (90 Base) MCG/ACT inhaler Inhale 1-2 puffs into the lungs every 6 (six) hours as needed for wheezing or shortness of breath. 02/11/21  Yes Particia Nearing, PA-C  doxycycline (VIBRAMYCIN) 100 MG capsule Take 1 capsule (100 mg total) by mouth 2 (two) times daily. 02/11/21  Yes Particia Nearing, PA-C  predniSONE (DELTASONE) 20 MG tablet Take 2 tablets (40 mg total) by mouth daily with breakfast. 02/11/21  Yes Particia Nearing, PA-C  promethazine-dextromethorphan (PROMETHAZINE-DM) 6.25-15 MG/5ML syrup Take 5 mLs by mouth 4 (four) times daily as needed for cough. 02/11/21  Yes Particia Nearing, PA-C  albuterol (VENTOLIN HFA) 108 (90 Base) MCG/ACT inhaler INHALE 2 PUFFS INTO THE LUNGS EVERY 4 HOURS AS NEEDED FOR WHEEZING OR SHORTNESS OF BREATH. 05/26/20   Atha Starks, Ryan, DO  azithromycin (ZITHROMAX) 250 MG tablet Take 1 tablet (250 mg total) by mouth daily. Take first 2 tablets together, then 1 every day until finished. 08/12/20  Curatolo, Adam, DO  benzonatate (TESSALON) 200 MG capsule Take 1 capsule (200 mg total) by mouth 2 (two) times daily as needed for cough. 04/29/20   Towanda Octave, MD    Family History Family History  Problem Relation Age of Onset   Hypertension Mother    Heart disease Father    Hyperlipidemia Father     Social History Social History   Tobacco Use   Smoking status: Never   Smokeless tobacco: Never  Substance Use Topics   Alcohol use: No    Alcohol/week: 2.0 standard drinks    Types: 2 Glasses of wine per week    Comment: occasional   Drug use: No     Allergies   Sulfa drugs cross reactors   Review of Systems Review of Systems Per HPI  Physical Exam Triage Vital Signs ED Triage Vitals  Enc Vitals Group     BP 02/11/21 1331 (!) 160/95     Pulse  Rate 02/11/21 1329 95     Resp 02/11/21 1329 19     Temp --      Temp src --      SpO2 02/11/21 1329 98 %     Weight --      Height --      Head Circumference --      Peak Flow --      Pain Score 02/11/21 1329 10     Pain Loc --      Pain Edu? --      Excl. in GC? --    No data found.  Updated Vital Signs BP (!) 160/95   Pulse 95   Resp 19   SpO2 98%   Visual Acuity Right Eye Distance:   Left Eye Distance:   Bilateral Distance:    Right Eye Near:   Left Eye Near:    Bilateral Near:     Physical Exam Vitals and nursing note reviewed.  Constitutional:      Appearance: Normal appearance.  HENT:     Head: Atraumatic.     Right Ear: Tympanic membrane and external ear normal.     Left Ear: Tympanic membrane and external ear normal.     Nose: Congestion present.     Mouth/Throat:     Mouth: Mucous membranes are moist.     Pharynx: Posterior oropharyngeal erythema present.  Eyes:     Extraocular Movements: Extraocular movements intact.     Conjunctiva/sclera: Conjunctivae normal.  Cardiovascular:     Rate and Rhythm: Normal rate and regular rhythm.     Heart sounds: Normal heart sounds.  Pulmonary:     Effort: Pulmonary effort is normal.     Breath sounds: Wheezing present. No rales.     Comments: Moderate to severe diffuse wheezes.  This was improved on recheck after 4 puffs of albuterol inhaler and IM Decadron. Musculoskeletal:        General: Normal range of motion.     Cervical back: Normal range of motion and neck supple.  Skin:    General: Skin is warm and dry.  Neurological:     Mental Status: She is alert and oriented to person, place, and time.  Psychiatric:        Mood and Affect: Mood normal.        Thought Content: Thought content normal.     UC Treatments / Results  Labs (all labs ordered are listed, but only abnormal results are displayed) Labs Reviewed - No data to  display  EKG   Radiology No results found.  Procedures Procedures  (including critical care time)  Medications Ordered in UC Medications  dexamethasone (DECADRON) injection 10 mg (10 mg Intramuscular Given 02/11/21 1341)    Initial Impression / Assessment and Plan / UC Course  I have reviewed the triage vital signs and the nursing notes.  Pertinent labs & imaging results that were available during my care of the patient were reviewed by me and considered in my medical decision making (see chart for details).     Given worsening course and duration will cover for lower respiratory tract infection with doxycycline, prednisone, albuterol inhaler, Phenergan DM.  Discussed supportive home care and return precautions.  IM Decadron was given in triage with mild fairly immediate relief. Final Clinical Impressions(s) / UC Diagnoses   Final diagnoses:  Lower respiratory infection  Wheezing  Shortness of breath   Discharge Instructions   None    ED Prescriptions     Medication Sig Dispense Auth. Provider   doxycycline (VIBRAMYCIN) 100 MG capsule Take 1 capsule (100 mg total) by mouth 2 (two) times daily. 14 capsule Particia Nearing, [place]   predniSONE (DELTASONE) 20 MG tablet Take 2 tablets (40 mg total) by mouth daily with breakfast. 10 tablet Particia Nearing, PA-C   promethazine-dextromethorphan (PROMETHAZINE-DM) 6.25-15 MG/5ML syrup Take 5 mLs by mouth 4 (four) times daily as needed for cough. 100 mL Particia Nearing, PA-C   albuterol (VENTOLIN HFA) 108 (90 Base) MCG/ACT inhaler Inhale 1-2 puffs into the lungs every 6 (six) hours as needed for wheezing or shortness of breath. 18 g Particia Nearing, [place]      PDMP not reviewed this encounter.   Particia Nearing, [place] 02/11/21 1554

## 2021-02-27 NOTE — Progress Notes (Signed)
    SUBJECTIVE:   CHIEF COMPLAINT / HPI:   Fever/congestion/cough: Symptoms started late last week but she thinks they've been going on for weeks to months. She was having fevers in the 101F range last week. She was having worsening of her asthma. She is still coughing and sometimes having chest tightness when her asthma flares. She went to urgent care and got decadron and doxycycline and it improved her symptoms but when she completed this course it worsened. She ran out of her asthma pump yesterday. She completed the antibiotics last Monday.   Left sided thoracic back pain No trauma, located in the muscle. Worse with position when sleeping. Has been going on for months. Worse when she gets ill and coughs.  She does not worsen her pain while taking deep breaths.  PERTINENT  PMH / PSH: None relevant  OBJECTIVE:   BP (!) 130/96   Pulse 95   Wt 258 lb 12.8 oz (117.4 kg)   SpO2 100%   BMI 40.53 kg/m    General: NAD, pleasant, able to participate in exam Cardiac: RRR, no murmurs. Respiratory: No respiratory distress, end expiratory wheezing present but it is mild, some fine crackles present in bilateral bases. MSK: No midline tenderness.  Some discomfort with palpation in the left trapezius, rhomboid, latissimus dorsi, and serratus anterior.  No pain experienced when patient takes deep breath Skin: warm and dry, no rashes noted  ASSESSMENT/PLAN:     Cough/congestion: Assessment: 50 y.o. female with symptoms consistent with a viral upper respiratory tract infection.  She has residual cough that is been going on for several months.  She was previously documented as having bronchitis back in May and she does not think her symptoms of ever really fully improved.  Her symptoms did significantly worsen last week with fevers and worsening cough and congestion I think this was due to new viral infection.  Lungs are mostly clear to auscultation with some fine expiratory wheezing present and few  crackles in bilateral bases.  We will get a chest x-ray to evaluate as her symptoms have been going on for some time.  COVID/RSV/influenza testing was performed.  Follow-up in 1 week.  We will follow-up in about a week to discuss doing PFTs as I think her symptoms are likely due to asthma which is not fully treated but she is never had PFTs before.  I did refill her albuterol inhaler.  Thoracic back pain: Likely musculoskeletal and likely secondary to her ongoing cough.  No trauma..  No red flag symptoms.  Recommended using ibuprofen 400 mg every 6 hours for next 4 days.  I do not think there is a need for imaging at this time.  Jackelyn Poling, DO [facility]

## 2021-03-02 ENCOUNTER — Other Ambulatory Visit: Payer: Self-pay | Admitting: Family Medicine

## 2021-03-02 NOTE — Telephone Encounter (Signed)
   Notes to clinic:  Pt not in this practice, requesting inhaler early, please assess.   Requested Prescriptions  Pending Prescriptions Disp Refills   albuterol (VENTOLIN HFA) 108 (90 Base) MCG/ACT inhaler [Pharmacy Med Name: ALBUTEROL HFA (VENTOLIN) INH] 18 each     Sig: INHALE 1-2 PUFFS BY MOUTH EVERY 6 HOURS AS NEEDED FOR WHEEZE OR SHORTNESS OF BREATH     Pulmonology:  Beta Agonists Failed - 03/02/2021  1:40 PM      Failed - One inhaler should last at least one month. If the patient is requesting refills earlier, contact the patient to check for uncontrolled symptoms.      Failed - Valid encounter within last 12 months    Recent Outpatient Visits   None

## 2021-03-03 ENCOUNTER — Encounter: Payer: Self-pay | Admitting: Family Medicine

## 2021-03-03 ENCOUNTER — Other Ambulatory Visit: Payer: Self-pay

## 2021-03-03 ENCOUNTER — Ambulatory Visit (INDEPENDENT_AMBULATORY_CARE_PROVIDER_SITE_OTHER): Payer: 59 | Admitting: Family Medicine

## 2021-03-03 ENCOUNTER — Ambulatory Visit (HOSPITAL_COMMUNITY)
Admission: RE | Admit: 2021-03-03 | Discharge: 2021-03-03 | Disposition: A | Discharge status: Home / Self Care | Payer: 59 | Source: Ambulatory Visit | Attending: Family Medicine | Admitting: Family Medicine

## 2021-03-03 VITALS — BP 130/96 | HR 95 | Wt 258.8 lb

## 2021-03-03 DIAGNOSIS — J069 Acute upper respiratory infection, unspecified: Secondary | ICD-10-CM | POA: Insufficient documentation

## 2021-03-03 DIAGNOSIS — M546 Pain in thoracic spine: Secondary | ICD-10-CM

## 2021-03-03 NOTE — Patient Instructions (Addendum)
We are sending you for chest x-ray to get an idea of how you are lungs are doing.  I am also tested for COVID, influenza, RSV.  I would like to see back in about a week for [place] to discuss the possibility of doing PFTs.  If you develop any trouble breathing or other concerning symptoms do not hesitate to go to the emergency department.  For your back pain I think this is due to your ongoing cough and recommend using ibuprofen 400 mg every 6 hours for the next 3 to 4 days.  I think if we can improve the cough they will improve this symptom.  If you develop any worsening pain or other symptoms do not hesitate to reach out to me

## 2021-03-04 LAB — COVID-19, FLU A+B AND RSV
Influenza A, NAA: NOT DETECTED
Influenza B, NAA: NOT DETECTED
RSV, NAA: NOT DETECTED
SARS-CoV-2, NAA: NOT DETECTED

## 2021-03-08 ENCOUNTER — Encounter: Payer: Self-pay | Admitting: Family Medicine

## 2021-03-09 ENCOUNTER — Other Ambulatory Visit: Payer: Self-pay | Admitting: Family Medicine

## 2021-03-09 MED ORDER — ALBUTEROL SULFATE HFA 108 (90 BASE) MCG/ACT IN AERS: 2.0000 | INHALATION_SPRAY | RESPIRATORY_TRACT | 0 refills | Status: DC | PRN

## 2021-03-14 ENCOUNTER — Encounter: Payer: Self-pay | Admitting: Family Medicine

## 2021-03-14 ENCOUNTER — Ambulatory Visit: Payer: 59 | Admitting: Family Medicine

## 2021-03-14 ENCOUNTER — Other Ambulatory Visit: Payer: Self-pay | Admitting: Family Medicine

## 2021-03-14 DIAGNOSIS — Z1231 Encounter for screening mammogram for malignant neoplasm of breast: Secondary | ICD-10-CM

## 2021-03-14 NOTE — Telephone Encounter (Signed)
LVM informing order has been placed along with number to contact [facility] directly for an appt.

## 2021-03-28 ENCOUNTER — Encounter: Payer: Self-pay | Admitting: Pharmacist

## 2021-03-28 ENCOUNTER — Ambulatory Visit (INDEPENDENT_AMBULATORY_CARE_PROVIDER_SITE_OTHER): Payer: 59 | Admitting: Pharmacist

## 2021-03-28 ENCOUNTER — Other Ambulatory Visit: Payer: Self-pay

## 2021-03-28 DIAGNOSIS — J989 Respiratory disorder, unspecified: Secondary | ICD-10-CM | POA: Diagnosis not present

## 2021-03-28 MED ORDER — FLUTICASONE FUROATE-VILANTEROL 100-25 MCG/ACT IN AEPB: 1.0000 | INHALATION_SPRAY | Freq: Every day | RESPIRATORY_TRACT | 0 refills | Status: DC

## 2021-03-28 NOTE — Progress Notes (Signed)
° °  S:    Patient arrives in good spirits, ambulating without assistance, and obvious cough.  Presents for lung function evaluation.   Patient was referred and last seen by Primary Care Provider on 03/03/2021.    Patient reports breathing has been sub-optimal since having a respiratory syndrome (possibily covid) in late 2021. Patient denies any asthma-like symptoms prior to her illness.  She reports previously singing and able to do much more without respiratory symptoms.   Patient reports adherence to medications Patient reports last dose of asthma medications was albuterol - PRN.   Subjectively her lung function / breathing was 4-5 /10 (Best in her history) Post Nebulization - patient reported her breathing was 7/10  O: Physical Exam Constitutional:      Appearance: Normal appearance. She is obese.  Neurological:     Mental Status: She is alert.  Psychiatric:        Mood and Affect: Mood normal.        Behavior: Behavior normal.        Thought Content: Thought content normal.        Judgment: Judgment normal.    Review of Systems  Constitutional:  Positive for fever (not recently - since last visit).  Respiratory:  Positive for cough, sputum production, shortness of breath and wheezing.   Musculoskeletal:  Positive for back pain (Thoracic).  All other systems reviewed and are negative.  Vitals:   03/28/21 0935  BP: 132/78  Pulse: 98  SpO2: 99%    See "scanned report" or Documentation Flowsheet (discrete results - PFTs) for  Spirometry results. Patient provided good effort while attempting spirometry.   Lung Age =  Albuterol Neb  Lot# [id number]     Exp. 05/2022   A/P: Patient has been experiencing shortness of breath, with increased sputum production, cough and thoracic/chest / back pain for ~ 1 year since respiratory illness.  She noted that her most improvement was following a burst of prednisone. Medication adherence is good with use of albuterol only PRN, however she  notes that her albuterol MDI is not provided as much relief. Spirometry evaluation showed pre- post bronchodilator change that was significant with both FEV1 and Fef 25/75 -begin [name] Ellipta 1 inhalation daily . -Educated patient on purpose, proper use, potential adverse effects including risk of esophageal candidiasis and need to rinse mouth after each use.  -Reviewed results of pulmonary function tests.   Patient verbalized understanding of results and education.  Written pt instructions provided.   F/U Rx Clinic visit PRN.  Next visit with Dr. Vincente Poli, PCP to reevaluate benefit of ICS/LABA therapy.  Consider long-term use of this therapy if patient is significant. Total time in face to face counseling 50 minutes.  Patient seen with Penny Pia, PharmD Candidate.

## 2021-03-28 NOTE — Progress Notes (Signed)
Reviewed: I agree with Dr. Koval's documentation and management. 

## 2021-03-28 NOTE — Assessment & Plan Note (Signed)
Patient has been experiencing shortness of breath, with increased sputum production, cough and thoracic/chest / back pain for ~ 1 year since respiratory illness.  She noted that her most improvement was following a burst of prednisone. Medication adherence is good with use of albuterol only PRN, however she notes that her albuterol MDI is not provided as much relief. Spirometry evaluation showed pre- post bronchodilator change that was significant with both FEV1 and Fef 25/75 -begin [name] Ellipta 1 inhalation daily. -Educated patient on purpose, proper use, potential adverse effects including risk of esophageal candidiasis and need to rinse mouth after each use.  -Reviewed results of pulmonary function tests.

## 2021-03-28 NOTE — Patient Instructions (Signed)
Nice to meet you today.   Your lung function test showed reversibility after albuterol treatment.   Please start  [name] Ellipta 1 inhalation daily.   Follow-up with Dr. Atha Starks within the next month.

## 2021-08-29 ENCOUNTER — Encounter: Payer: Self-pay | Admitting: *Deleted

## 2022-01-04 ENCOUNTER — Ambulatory Visit: Payer: 59 | Admitting: Student

## 2022-01-04 ENCOUNTER — Ambulatory Visit (HOSPITAL_COMMUNITY)
Admission: RE | Admit: 2022-01-04 | Discharge: 2022-01-04 | Disposition: A | Discharge status: Home / Self Care | Payer: 59 | Source: Ambulatory Visit | Attending: Family Medicine | Admitting: Family Medicine

## 2022-01-04 ENCOUNTER — Encounter: Payer: Self-pay | Admitting: Student

## 2022-01-04 ENCOUNTER — Telehealth: Payer: Self-pay | Admitting: Family Medicine

## 2022-01-04 VITALS — BP 124/80 | HR 92 | Wt 245.0 lb

## 2022-01-04 DIAGNOSIS — B9789 Other viral agents as the cause of diseases classified elsewhere: Secondary | ICD-10-CM

## 2022-01-04 DIAGNOSIS — R051 Acute cough: Secondary | ICD-10-CM | POA: Diagnosis present

## 2022-01-04 DIAGNOSIS — J989 Respiratory disorder, unspecified: Secondary | ICD-10-CM

## 2022-01-04 DIAGNOSIS — J988 Other specified respiratory disorders: Secondary | ICD-10-CM

## 2022-01-04 MED ORDER — FLUTICASONE FUROATE-VILANTEROL 100-25 MCG/ACT IN AEPB: 1.0000 | INHALATION_SPRAY | Freq: Every day | RESPIRATORY_TRACT | 0 refills | Status: DC

## 2022-01-04 NOTE — Addendum Note (Signed)
Addended by: Orvis Brill on: 01/04/2022 02:48 PM   Modules accepted: Orders

## 2022-01-04 NOTE — Assessment & Plan Note (Addendum)
5 days of cough, intermittent fever and headache.  Consistent with likely viral illness.  Consider bronchitis, pneumonia, other acute complication secondary to undiagnosed lung disease.  No history of CHF or signs of peripheral edema on exam.  No focal diminished lung sounds.  On exam, well-appearing in no distress without hypoxia.  Lungs with crackles at bases with productive cough. Of note, she does have a history of unclear diagnosis of respiratory complication following COVID infection where she has consistent shortness of breath, sputum production.  She had spirometry completed in the office with Dr. Everitt Amber that showed prepost bronchodilator with FEV V1 and FeF 25/75 and she was started on [name] Ellipta 1 puff daily to lungs.  She was to follow-up after that to determine if this was beneficial for her and should be continued, but I do not see any follow-up visit. She started taking the [facility] when her symptoms started this time around, which has been helpful. -Obtain chest x-ray today, if signs of multifocal pneumonia or, will plan for antibiotic +/-steroids -COVID/flu swabs collected  -Referral to pulmonology -[name] Ellipta refilled

## 2022-01-04 NOTE — Progress Notes (Signed)
    SUBJECTIVE:   CHIEF COMPLAINT / HPI:   Christina Sparks is a 51 year old female with a history of COVID s/p respiratory complications following her infection back in 2022, here with cough and fever for 5 days.  She had fever to 102.0 Fahrenheit at home, also has had headaches.  Her cough was initially dry that started 5 days ago, now she is having productive sputum that is tan in color.  Denies any hemoptysis.  Denies any other symptoms including vomiting, diarrhea, abdominal pain.  She is a never smoker.  PERTINENT  PMH / PSH: Reviewed  OBJECTIVE:   BP 124/80   Pulse 92   Wt 245 lb (111.1 kg)   LMP 12/05/2021   SpO2 98%   BMI 37.81 kg/m   General: Well-appearing, no acute distress, wearing mask CV: Regular rate and rhythm Respiratory: Normal work of breathing.  Diffuse crackles throughout left lung fields, worse at the base.  Scant wheezing and crackles at the right lung base.  Good aeration throughout. Wet cough. Extremities: Warm, well-perfused, no edema   ASSESSMENT/PLAN:   Viral respiratory illness 5 days of cough, intermittent fever and headache.  Consistent with likely viral illness.  Consider bronchitis, pneumonia, other acute complication secondary to undiagnosed lung disease.  No history of CHF or signs of peripheral edema on exam.  No focal diminished lung sounds.  On exam, well-appearing in no distress without hypoxia.  Lungs with crackles at bases with productive cough. Of note, she does have a history of unclear diagnosis of respiratory complication following COVID infection where she has consistent shortness of breath, sputum production.  She had spirometry completed in the office with Dr. Everitt Amber that showed prepost bronchodilator with FEV V1 and FeF 25/75 and she was started on [name] Ellipta 1 puff daily to lungs.  She was to follow-up after that to determine if this was beneficial for her and should be continued, but I do not see any follow-up visit. She started taking the [employer] when her symptoms started this time around, which has been helpful. -Obtain chest x-ray today, if signs of multifocal pneumonia or, will plan for antibiotic +/-steroids -COVID/flu swabs collected  -Referral to pulmonology -[name] Ellipta refilled     Christina Sparks, Christina Sparks

## 2022-01-04 NOTE — Patient Instructions (Signed)
It was great meeting you today.  Please go get your chest x-ray completed at [facility] radiology department.  You to go to the main entrance and they will direct you where to go.  Depending on these results, we may or may not start treatment with antibiotics and/or steroids.  I will call you with results and further steps.  In the meantime, I refilled your [name] Ellipta inhaler.  I also referred you to pulmonology so that you can have complete pulmonary function test for a formal diagnosis.  Let me know if you have any questions, Dr. Owens Shark

## 2022-01-04 NOTE — Telephone Encounter (Signed)
After-hours/emergency line  Patient calls the after-hours line stating that she was seen in clinic earlier today and was provided a [name] Ellipta sample in clinic however patient forgot the inhaler in the clinic.  She states she called the nurse line earlier and was provided a prescription of the [facility]; however, patient reports that it would cost her $140.  She was asking if she would be able to get a sample from the hospital.  I informed her that would not be a possibility and advised that she return to clinic in the morning for the inhaler.

## 2022-01-05 MED ORDER — TRELEGY ELLIPTA 100-62.5-25 MCG/ACT IN AEPB: 1.0000 | INHALATION_SPRAY | Freq: Every day | RESPIRATORY_TRACT | 0 refills | Status: DC

## 2022-01-05 NOTE — Telephone Encounter (Signed)
Contacted patient to discuss potential availability of a sample to replace [name] Patient had very quiet voice and slow to speak.  Denied severe dyspnea.  We discussed only alternative sample we have today was TRELEGY.  Patient educated on purpose, proper use and potential adverse effects of thrush.  Following instruction patient verbalized understanding of treatment plan of 1 inhalation once daily.   Medication Samples have been provided to the patient.  Drug name: Trelegy       Strength: 123mcg fluticasone        Qty: 28 day supply (2 inhalers)  LOT: [id number]  Exp.Date: 04/26/2023  Dosing instructions: 1 inhalation daily  The patient has been instructed regarding the correct time, dose, and frequency of taking this medication, including desired effects and most common side effects.   Christina Sparks 2:18 PM 01/05/2022  Patient will pick-up later today - medication left at front desk.

## 2022-01-05 NOTE — Addendum Note (Signed)
Addended by: Leavy Cella on: 01/05/2022 02:27 PM   Modules accepted: Orders

## 2022-01-05 NOTE — Telephone Encounter (Signed)
Patient returns call to nurse in regards to [facility] sample.   Patient is requesting a sample.   Will forward to pharmacy team to advise on sample.

## 2022-01-06 LAB — COVID-19, FLU A+B NAA
Influenza A, NAA: NOT DETECTED
Influenza B, NAA: NOT DETECTED
SARS-CoV-2, NAA: NOT DETECTED

## 2022-01-08 ENCOUNTER — Other Ambulatory Visit: Payer: Self-pay | Admitting: Student

## 2022-01-08 ENCOUNTER — Telehealth: Payer: Self-pay | Admitting: Student

## 2022-01-08 MED ORDER — AZITHROMYCIN 250 MG PO TABS: ORAL_TABLET | ORAL | 0 refills | Status: DC

## 2022-01-08 MED ORDER — AMOXICILLIN 500 MG PO CAPS: 1000.0000 mg | ORAL_CAPSULE | Freq: Three times a day (TID) | ORAL | 0 refills | Status: AC

## 2022-01-08 NOTE — Telephone Encounter (Signed)
Called patient with no answer to discuss Chest X-ray results showing left lower lobe pneumonia. Left HIPAA compliant voicemail to call clinic back.

## 2022-01-08 NOTE — Telephone Encounter (Signed)
Patient returns call to nurse line.  Will forward back to provider.

## 2022-01-08 NOTE — Telephone Encounter (Signed)
Noted and agree. 

## 2022-01-08 NOTE — Progress Notes (Signed)
Discussed CXR results with patient. Sent in Amoxicillin 1g TID x 7 days and Z-pak for CAP. Discussed return precautions with patient.

## 2022-01-18 ENCOUNTER — Other Ambulatory Visit: Payer: Self-pay

## 2022-01-18 ENCOUNTER — Ambulatory Visit (INDEPENDENT_AMBULATORY_CARE_PROVIDER_SITE_OTHER): Payer: 59 | Admitting: Student

## 2022-01-18 VITALS — BP 134/80 | HR 91 | Wt 245.8 lb

## 2022-01-18 DIAGNOSIS — J989 Respiratory disorder, unspecified: Secondary | ICD-10-CM

## 2022-01-18 MED ORDER — PREDNISONE 20 MG PO TABS: 40.0000 mg | ORAL_TABLET | Freq: Every day | ORAL | 0 refills | Status: AC

## 2022-01-18 MED ORDER — ALBUTEROL SULFATE (2.5 MG/3ML) 0.083% IN NEBU
2.5000 mg | INHALATION_SOLUTION | Freq: Once | RESPIRATORY_TRACT | Status: AC
  Administered 2022-01-18: 2.5 mg via RESPIRATORY_TRACT

## 2022-01-18 MED ORDER — IPRATROPIUM-ALBUTEROL 0.5-2.5 (3) MG/3ML IN SOLN: 3.0000 mL | Freq: Once | RESPIRATORY_TRACT | Status: DC

## 2022-01-18 MED ORDER — IPRATROPIUM BROMIDE 0.02 % IN SOLN
0.5000 mg | Freq: Once | RESPIRATORY_TRACT | Status: AC
  Administered 2022-01-18: 0.5 mg via RESPIRATORY_TRACT

## 2022-01-18 MED ORDER — ALBUTEROL SULFATE HFA 108 (90 BASE) MCG/ACT IN AERS: 1.0000 | INHALATION_SPRAY | RESPIRATORY_TRACT | 1 refills | Status: DC | PRN

## 2022-01-18 MED ORDER — TRELEGY ELLIPTA 100-62.5-25 MCG/ACT IN AEPB: 100.0000 ug | INHALATION_SPRAY | Freq: Once | RESPIRATORY_TRACT | 0 refills | Status: AC

## 2022-01-18 NOTE — Patient Instructions (Signed)
It was great to see you! Thank you for allowing me to participate in your care!  Looking at your lung testing, it seems like you have some asthma component going on. As your breathing improved with medication, which is the case with asthma. I recommend you keep a rescue inhaler around to be used for shortness of breath/wheezing. Sometimes airways can flare/be more reactive after an illness and this sounds like what may be going on with you.   Our plans for today:  - Albuterol inhaler:   To be used as needed for shortness of breath, or wheezing.  - Prednisone  40 mg for 5 days  Seek medical care if:  Developing fevers, body aches, feeling ill  Shortness of breath/wheezing, not responsive to albuterol  Follow up with Pulmonology on 01/25/22  Take care and seek immediate care sooner if you develop any concerns.   Dr. Holley Bouche, MD [place]

## 2022-01-18 NOTE — Progress Notes (Signed)
  SUBJECTIVE:   CHIEF COMPLAINT / HPI:   Hx of lung PFT showing reversibility with breathing treatment, started on ICS/Laba - [name] ellipta, requesting breathing treatment today.   Was seen two weeks ago and had PNA with fluid on the lungs. Has been having coughing whenever she sits up. Has appointment with pulmonary on Tuesday 11/2. Had a similar episode of this last year. Is also feeling SOB when talking, and difficulty using voice/tired from talking. Also appreciates she's having some wheezing.   Has also been on breo ellipta with good results, but was switched to trellergy sample at last visit. Wanting to switch back to breo ellipta.   PERTINENT  PMH / PSH:   OBJECTIVE:  BP 134/80   Pulse 91   Wt 245 lb 12.8 oz (111.5 kg)   LMP 12/05/2021   SpO2 98%   BMI 37.93 kg/m  Physical Exam Constitutional:      Appearance: Normal appearance.  Cardiovascular:     Rate and Rhythm: Normal rate and regular rhythm.     Pulses: Normal pulses.     Heart sounds: Normal heart sounds. No murmur heard.    No friction rub. No gallop.  Pulmonary:     Effort: No respiratory distress.     Breath sounds: No wheezing (crackles heard in left lung field, decreased breath sounds gloablly).  Neurological:     Mental Status: She is alert.      ASSESSMENT/PLAN:  Respiratory complication Assessment & Plan: Patient with Hx of breathing issues, correctable with bronchodilator. Patient reports breathing issues following recent PNA infection. She has completed all abx and is not having any fever or body aches/feeling ill. Just notes that she has some SOB and cough whenever she sits up. Patient with slightly diminished breath sounds on exam, improved with duonebs tx in clinic. Patient unlikely to have recurrence of PNA, most likely has reactive airways following this sickness. Will recommend tx with prednisone and starting rescue inhaler.  -Trellegy sample given -Albuterol refilled -Prednisone 40 mg x 5  days -f/u with Pulm -Patient told to seek medical care if Resp symptoms not responsive to albuterol, or if she develops new systemic symptoms  Orders: -     Albuterol Sulfate -     Ipratropium Bromide  Other orders -     Albuterol Sulfate HFA; Inhale 1-2 puffs into the lungs every 4 (four) hours as needed for wheezing or shortness of breath.  Dispense: 8.5 each; Refill: 1 -     predniSONE; Take 2 tablets (40 mg total) by mouth daily with breakfast for 5 days.  Dispense: 10 tablet; Refill: 0 -     Trelegy Ellipta; Inhale 100 mcg into the lungs once for 1 dose.  Dispense: 1 each; Refill: 0   No follow-ups on file. Holley Bouche, MD 01/18/2022, 12:06 PM PGY-2, [place]

## 2022-01-18 NOTE — Assessment & Plan Note (Addendum)
Patient with Hx of breathing issues, correctable with bronchodilator. Patient reports breathing issues following recent PNA infection. She has completed all abx and is not having any fever or body aches/feeling ill. Just notes that she has some SOB and cough whenever she sits up. Patient with slightly diminished breath sounds on exam, improved with duonebs tx in clinic. Patient unlikely to have recurrence of PNA, most likely has reactive airways following this sickness. Will recommend tx with prednisone and starting rescue inhaler.  -Trellegy sample given -Albuterol refilled -Prednisone 40 mg x 5 days -f/u with Pulm -Patient told to seek medical care if Resp symptoms not responsive to albuterol, or if she develops new systemic symptoms

## 2022-01-25 ENCOUNTER — Encounter: Payer: Self-pay | Admitting: Internal Medicine

## 2022-01-25 ENCOUNTER — Ambulatory Visit (INDEPENDENT_AMBULATORY_CARE_PROVIDER_SITE_OTHER): Payer: 59

## 2022-01-25 ENCOUNTER — Ambulatory Visit: Payer: 59 | Admitting: Internal Medicine

## 2022-01-25 VITALS — BP 136/82 | HR 89 | Temp 98.3°F | Ht 67.0 in

## 2022-01-25 DIAGNOSIS — R053 Chronic cough: Secondary | ICD-10-CM

## 2022-01-25 DIAGNOSIS — R918 Other nonspecific abnormal finding of lung field: Secondary | ICD-10-CM | POA: Diagnosis not present

## 2022-01-25 LAB — CBC WITH DIFFERENTIAL/PLATELET
Basophils Absolute: 0.1 10*3/uL (ref 0.0–0.1)
Basophils Relative: 1.1 % (ref 0.0–3.0)
Eosinophils Absolute: 0.3 10*3/uL (ref 0.0–0.7)
Eosinophils Relative: 2.9 % (ref 0.0–5.0)
HCT: 32.2 % — ABNORMAL LOW (ref 36.0–46.0)
Hemoglobin: 11.1 g/dL — ABNORMAL LOW (ref 12.0–15.0)
Lymphocytes Relative: 48.6 % — ABNORMAL HIGH (ref 12.0–46.0)
Lymphs Abs: 4.3 10*3/uL — ABNORMAL HIGH (ref 0.7–4.0)
MCHC: 34.6 g/dL (ref 30.0–36.0)
MCV: 84.5 fl (ref 78.0–100.0)
Monocytes Absolute: 0.6 10*3/uL (ref 0.1–1.0)
Monocytes Relative: 7.1 % (ref 3.0–12.0)
Neutro Abs: 3.6 10*3/uL (ref 1.4–7.7)
Neutrophils Relative %: 40.3 % — ABNORMAL LOW (ref 43.0–77.0)
Platelets: 341 10*3/uL (ref 150.0–400.0)
RBC: 3.8 Mil/uL — ABNORMAL LOW (ref 3.87–5.11)
RDW: 18.5 % — ABNORMAL HIGH (ref 11.5–15.5)
WBC: 8.9 10*3/uL (ref 4.0–10.5)

## 2022-01-25 LAB — SEDIMENTATION RATE: Sed Rate: 25 mm/hr (ref 0–30)

## 2022-01-25 LAB — POCT EXHALED NITRIC OXIDE: FeNO level (ppb): 14

## 2022-01-25 MED ORDER — PREDNISONE 10 MG PO TABS: ORAL_TABLET | ORAL | 0 refills | Status: DC

## 2022-01-25 MED ORDER — TRAMADOL HCL 50 MG PO TABS: 50.0000 mg | ORAL_TABLET | ORAL | 0 refills | Status: AC | PRN

## 2022-01-25 MED ORDER — PANTOPRAZOLE SODIUM 40 MG PO TBEC: 40.0000 mg | DELAYED_RELEASE_TABLET | Freq: Every day | ORAL | 2 refills | Status: DC

## 2022-01-25 MED ORDER — FAMOTIDINE 20 MG PO TABS: ORAL_TABLET | ORAL | 11 refills | Status: DC

## 2022-01-25 NOTE — Patient Instructions (Addendum)
Mucinex dm 1200 mg twice daily and supplement with tramadol 50 mg  1 every 4 hours as needed  Prednisone 10 mg take  4 each am x 2 days,   2 each am x 2 days,  1 each am x 2 days and stop   Pantoprazole (protonix) 40 mg   Take  30-60 min before first meal of the day and Pepcid (famotidine)  20 mg after supper until return to office - this is the best way to tell whether stomach acid is contributing to your problem.    GERD (REFLUX)  is an extremely common cause of respiratory symptoms just like yours , many times with no obvious heartburn at all.    It can be treated with medication, but also with lifestyle changes including elevation of the head of your bed (ideally with 6 -8inch blocks under the headboard of your bed),  Smoking cessation, avoidance of late meals, excessive alcohol, and avoid fatty foods, chocolate, peppermint, colas, red wine, and acidic juices such as orange juice.  NO MINT OR MENTHOL PRODUCTS SO NO COUGH DROPS  USE SUGARLESS CANDY INSTEAD (Jolley ranchers or Stover's or Life Savers) or even ice chips will also do - the key is to swallow to prevent all throat clearing. NO OIL BASED VITAMINS - use powdered substitutes.  Avoid fish oil when coughing.   Please remember to go to the lab and x-ray department  for your tests - we will call you with the results when they are available.  Please schedule a follow up office visit in 2  weeks, sooner if needed  Consider ct sinus and hrct chest if not improving

## 2022-01-25 NOTE — Progress Notes (Signed)
Christina Sparks, female    DOB: October 13, 1970   MRN: [id number]   Brief patient profile:  25 yobf  never smoker referred to pulmonary clinic 01/25/2022 by Dorris Singh for refractory cough  With a pattern of cough that lasts months to a year starting  in her 67s ? Responsive to [facility] well for up to a year and then worse "with the cold weather" but  since  the year of covid 19 came and stayed though never testest pos for covid and no response to trelegy   Baseline wt 215 peak was 278   History of Present Illness  01/25/2022  Pulmonary/ 1st office eval/Chailyn Racette  Chief Complaint  Patient presents with   Consult  Dyspnea:  not aerobically active but loses breath to speak/not walk   Cough: yellowish better at bedtime as long as rotates off the L side / sma SABA use: not helpful, makes her cough  L cp with cough rad front to back recurrent x years  same pattern with neg ct x 2, one of which CTa  06/14/19   No obvious day to day or daytime pattern/variability or assoc   mucus plugs or hemoptysis  , subjective wheeze or overt sinus or hb symptoms.   Sleeping as above  without nocturnal  or early am exacerbation  of respiratory  c/o's or need for noct saba. Also denies any obvious fluctuation of symptoms with weather or environmental changes or other aggravating or alleviating factors except as outlined above   No unusual exposure hx or h/o childhood pna/ asthma or knowledge of premature birth.  Current Allergies, Complete Past Medical History, Past Surgical History, Family History, and Social History were reviewed in [employer] record.  ROS  The following are not active complaints unless bolded Hoarseness, sore throat, dysphagia, dental problems, itching, sneezing,  nasal congestion or discharge of excess mucus or purulent secretions, ear ache,   fever, chills, sweats, unintended wt loss or wt gain, classically pleuritic or exertional cp,  orthopnea pnd or arm/hand swelling  or  leg swelling, presyncope, palpitations, abdominal pain, anorexia, nausea, vomiting, diarrhea  or change in bowel habits or change in bladder habits, change in stools or change in urine, dysuria, hematuria,  rash, arthralgias, visual complaints, headache, numbness, weakness or ataxia or problems with walking or coordination,  change in mood or  memory.             Past Medical History:  Diagnosis Date   Anemia    Back pain    Knee pain    Migraines    S/P bilateral breast reduction    S/P tubal ligation     Outpatient Medications Prior to Visit  Medication Sig Dispense Refill   albuterol (VENTOLIN HFA) 108 (90 Base) MCG/ACT inhaler Inhale 1-2 puffs into the lungs every 4 (four) hours as needed for wheezing or shortness of breath. 8.5 each 1   azithromycin (ZITHROMAX Z-PAK) 250 MG tablet Take 2 tablets on day 1. Take 1 tablet for days 2,3,4,5. (Patient not taking: Reported on 01/25/2022) 6 each 0   Fluticasone-Umeclidin-Vilant (TRELEGY ELLIPTA) 100-62.5-25 MCG/ACT AEPB Inhale 1 Inhalation into the lungs daily. (Patient not taking: Reported on 01/25/2022) 2 each 0   ibuprofen (ADVIL) 200 MG tablet Take 600 mg by mouth every 8 (eight) hours as needed for headache. (Patient not taking: Reported on 01/25/2022)     No facility-administered medications prior to visit.     Objective:     BP  136/82 (BP Location: Left Arm, Cuff Size: Normal)   Pulse 89   Temp 98.3 F (36.8 C) (Oral)   Ht 5\' 7"  (1.702 m)   LMP 12/05/2021   SpO2 98% Comment: RA  BMI 38.50 kg/m   SpO2: 98 % (RA)  Amb extremely hoars bf /harsh dry cough    HEENT : Oropharynx  clear   Nasal turbinates nl    NECK :  without  apparent JVD/ palpable Nodes/TM    LUNGS: no acc muscle use,  Nl contour chest which is clear to A and P bilaterally without cough on insp or exp maneuvers   CV:  RRR  no s3 or murmur or increase in P2, and no edema   ABD:  soft and nontender with nl inspiratory excursion in the supine  position. No bruits or organomegaly appreciated   MS:  Nl gait/ ext warm without deformities Or obvious joint restrictions  calf tenderness, cyanosis or clubbing    SKIN: warm and dry without lesions    NEURO:  alert, approp, nl sensorium with  no motor or cerebellar deficits apparent.   CXR PA and Lateral:   01/25/2022 :    I personally reviewed images and agree with radiology impression as follows:    No active cardiopulmonary disease.   Labs ordered/ reviewed:      Chemistry      Component Value Date/Time   NA 136 08/11/2020 1936   K 3.5 08/11/2020 1936   CL 104 08/11/2020 1936   CO2 26 08/11/2020 1936   BUN 13 08/11/2020 1936   CREATININE 0.66 08/11/2020 1936   CREATININE 0.71 09/28/2015 1637      Component Value Date/Time   CALCIUM 8.8 (L) 08/11/2020 1936   ALKPHOS 71 06/13/2019 2225   AST 32 06/13/2019 2225   ALT 20 06/13/2019 2225   BILITOT 0.2 (L) 06/13/2019 2225        Lab Results  Component Value Date   WBC 8.9 01/25/2022   HGB 11.1 (L) 01/25/2022   HCT 32.2 (L) 01/25/2022   MCV 84.5 01/25/2022   PLT 341.0 01/25/2022       EOS                                                              0.3                                   01/25/2022          Lab Results  Component Value Date   ESRSEDRATE 25 01/25/2022            Assessment   Chronic cough Onset in her 40s lasting months at a time then up to a year s symptoms or need for meds/ worse since 2020 covid (neg testing) - cough worse on use of saba/trelegy  01/25/2022  - FENO 01/25/2022 = 14 - Spirometry 01/25/2022  nl off trelegy x 3 days  - Allergy screen 01/25/2022 >  Eos 0. 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   These are the same pts (now being labeled as having "irritable larynx syndrome" by some cough centers) who not infrequently have a history of having failed to tolerate ace inhibitors,  dry powder inhalers or biphosphonates or report having atypical/extraesophageal reflux symptoms that don't respond to standard doses of PPI  and are easily confused as having aecopd or asthma flares by even experienced allergists/ pulmonologists (myself included).   Of the three most common causes of  Sub-acute / recurrent or chronic cough, only one (GERD)  can actually contribute to/ trigger  the other two (asthma and post nasal drip syndrome)  and perpetuate the cylce of cough.  While not intuitively obvious, many patients with chronic low grade reflux do not cough until there is a primary insult that disturbs the protective epithelial barrier and exposes sensitive nerve endings.   This is typically viral but can due to PNDS and  either may apply here.   The point is that once this occurs, it is difficult to eliminate the cycle  using anything but a maximally effective acid suppression regimen at least in the short run, accompanied by an appropriate diet to address non acid GERD and control / eliminate the cough itself for at least 3 days with tramadol and >>> also so added 6 day taper off  Prednisone starting at 40 mg per day in case of component of Th-2 driven upper or lower airways inflammation (if cough responds short term only to relapse before return while will on full rx for uacs (as above), then  that would point to allergic rhinitis/ asthma or eos bronchitis as alternative dx)   Regroup in 2 weeks with all meds in hand  using a trust but verify approach to confirm accurate Medication  Reconciliation The principal here is that until we are certain that the  patients are doing what we've asked, it makes no sense to ask them to do more.   Pulmonary infiltrates on CXR Assoc with recurrent cp on L x years with neg CTa in 2021 and CT w/o in 08/11/20   Suggest bronchiectasis but not obvious on exam on CT as recently as 08/11/20 - next step in w/u =  HRCT and sinus ct if cough or cp don't respond to cyclical cough rx         Each maintenance medication was reviewed in detail including emphasizing most importantly the difference between maintenance and prns and under what circumstances the prns are to be triggered using an action plan format where appropriate.  Total time for H and P, chart review, counseling, reviewing hfa device(s) and generating customized AVS unique to this office visit / same day charting > 60 min initial eval of acute on chronic resp cc of ? Etiology  x  over a decade.          Sandrea Hughs, MD 01/25/2022

## 2022-01-26 ENCOUNTER — Encounter: Payer: Self-pay | Admitting: Internal Medicine

## 2022-01-26 DIAGNOSIS — R918 Other nonspecific abnormal finding of lung field: Secondary | ICD-10-CM | POA: Insufficient documentation

## 2022-01-26 LAB — IGE: IgE (Immunoglobulin E), Serum: 1502 kU/L — ABNORMAL HIGH (ref <=114)

## 2022-01-26 NOTE — Assessment & Plan Note (Signed)
Onset in her 40s lasting months at a time then up to a year s symptoms or need for meds/ worse since 2020 covid (neg testing) - cough worse on use of saba/trelegy  01/25/2022  - FENO 01/25/2022 = 14 - Spirometry 01/25/2022  nl off trelegy x 3 days  - Allergy screen 01/25/2022 >  Eos 0. 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   These are the same pts (now being labeled as having "irritable larynx syndrome" by some cough centers) who not infrequently have a history of having failed to tolerate ace inhibitors,  dry powder inhalers or biphosphonates or report having atypical/extraesophageal reflux symptoms that don't respond to standard doses of PPI  and are easily confused as having aecopd or asthma flares by even experienced allergists/ pulmonologists (myself included).   Of the three most common causes of  Sub-acute / recurrent or chronic cough, only one (GERD)  can actually contribute to/ trigger  the other two (asthma and post nasal drip syndrome)  and perpetuate the cylce of cough.  While not intuitively obvious, many patients with chronic low grade reflux do not cough until there is a primary insult that disturbs the protective epithelial barrier and exposes sensitive nerve endings.   This is typically viral but can due to PNDS and  either may apply here.   The point is that once this occurs, it is difficult to eliminate the cycle   using anything but a maximally effective acid suppression regimen at least in the short run, accompanied by an appropriate diet to address non acid GERD and control / eliminate the cough itself for at least 3 days with tramadol and >>> also so added 6 day taper off  Prednisone starting at 40 mg per day in case of component of Th-2 driven upper or lower airways inflammation (if cough responds short term only to relapse before return while will on full rx for uacs (as above), then  that would point to allergic rhinitis/ asthma or eos bronchitis as alternative dx)   Regroup in 2 weeks with all meds in hand using a trust but verify approach to confirm accurate Medication  Reconciliation The principal here is that until we are certain that the  patients are doing what we've asked, it makes no sense to ask them to do more.

## 2022-01-26 NOTE — Assessment & Plan Note (Signed)
Assoc with recurrent cp on L x years with neg CTa in 2021 and CT w/o in 08/11/20   Suggest bronchiectasis but not obvious on exam on CT as recently as 08/11/20 - next step in w/u =  HRCT and sinus ct if cough or cp don't respond to cyclical cough rx         Each maintenance medication was reviewed in detail including emphasizing most importantly the difference between maintenance and prns and under what circumstances the prns are to be triggered using an action plan format where appropriate.  Total time for H and P, chart review, counseling, reviewing hfa device(s) and generating customized AVS unique to this office visit / same day charting > 60 min initial eval of acute on chronic resp cc of ? Etiology  x  over a decade.

## 2022-01-29 NOTE — Progress Notes (Signed)
Called pt and there was no answer-LMTCB °

## 2022-02-07 ENCOUNTER — Encounter: Payer: Self-pay | Admitting: *Deleted

## 2022-02-07 NOTE — Progress Notes (Signed)
Called the pt and there was no answer and no VM. Mailed letter.

## 2022-02-08 ENCOUNTER — Encounter: Payer: Self-pay | Admitting: Internal Medicine

## 2022-02-08 ENCOUNTER — Ambulatory Visit (INDEPENDENT_AMBULATORY_CARE_PROVIDER_SITE_OTHER): Payer: 59 | Admitting: Internal Medicine

## 2022-02-08 VITALS — BP 126/64 | HR 85 | Temp 98.1°F | Ht 67.5 in | Wt 250.4 lb

## 2022-02-08 DIAGNOSIS — R053 Chronic cough: Secondary | ICD-10-CM | POA: Diagnosis not present

## 2022-02-08 MED ORDER — BENZONATATE 200 MG PO CAPS: 200.0000 mg | ORAL_CAPSULE | Freq: Three times a day (TID) | ORAL | 1 refills | Status: DC | PRN

## 2022-02-08 MED ORDER — PANTOPRAZOLE SODIUM 40 MG PO TBEC: 40.0000 mg | DELAYED_RELEASE_TABLET | Freq: Every day | ORAL | 2 refills | Status: DC

## 2022-02-08 MED ORDER — FAMOTIDINE 20 MG PO TABS: ORAL_TABLET | ORAL | 11 refills | Status: DC

## 2022-02-08 MED ORDER — PREDNISONE 10 MG PO TABS: ORAL_TABLET | ORAL | 0 refills | Status: DC

## 2022-02-08 NOTE — Progress Notes (Signed)
[name], female    DOB: Mar 04, 1971   MRN: [id number]   Brief patient profile:  63 yobf  never smoker referred to pulmonary clinic 01/25/2022 by Christina Sparks for refractory cough  With a pattern of cough that lasts months to a year starting  in her 19s ? Responsive to [facility] well for up to a year and then worse "with the cold weather" but  since  the year of covid 19 came and stayed though never tested pos for covid and no response to trelegy   Baseline wt 215 in her 40s  peak was 278   History of Present Illness  01/25/2022  Pulmonary/ 1st office eval/Christina Sparks  Chief Complaint  Patient presents with   Consult  Dyspnea:  not aerobically active but loses breath to speak/not walk   Cough: yellowish better at bedtime as long as rotates off the L side / sma SABA use: not helpful, makes her cough  L cp with cough rad front to back recurrent x years  same pattern with neg ct x 2, one of which CTa  06/14/19 Rec Mucinex dm 1200 mg twice daily and supplement with tramadol 50 mg  1 every 4 hours as needed Prednisone 10 mg take  4 each am x 2 days,   2 each am x 2 days,  1 each am x 2 days and stop  Pantoprazole (protonix) 40 mg   Take  30-60 min before first meal of the day and Pepcid (famotidine)  20 mg after supper until return to office - GERD diet reviewed, bed blocks rec   lab and x-ray department  for your tests - we will call you with the results when they are available. Please schedule a follow up office visit in 2  weeks, sooner if needed    Allergy screen 01/25/2022 >  Eos 0. 3/  IgE  1502   02/08/2022  f/u ov/Christina Sparks re: cough    maint on no rx   Chief Complaint  Patient presents with   Follow-up    Feeling better 85%. Cough-yellow, not as hoarse, slight sob  Dyspnea:  loses breath to speak, not walk  Cough: bad cough each am slt yellowish mucus  Sleeping: flat bed ok for a few hours then wakes up cough w/in a few hours q noct assoc with thorat tickle  SABA use: none  02: none   Covid status:   vax one injections     No obvious day to day or daytime variability or assoc excess/ purulent sputum or mucus plugs or hemoptysis or cp or chest tightness, subjective wheeze or overt sinus or hb symptoms.    Also denies any obvious fluctuation of symptoms with weather or environmental changes or other aggravating or alleviating factors except as outlined above   No unusual exposure hx or h/o childhood pna/ asthma or knowledge of premature birth.  Current Allergies, Complete Past Medical History, Past Surgical History, Family History, and Social History were reviewed in [employer] record.  ROS  The following are not active complaints unless bolded Hoarseness, sore throat, dysphagia, dental problems, itching, sneezing,  nasal congestion or discharge of excess mucus or purulent secretions, ear ache,   fever, chills, sweats, unintended wt loss or wt gain, classically pleuritic or exertional cp,  orthopnea pnd or arm/hand swelling  or leg swelling, presyncope, palpitations, abdominal pain, anorexia, nausea, vomiting, diarrhea  or change in bowel habits or change in bladder habits, change in stools or  change in urine, dysuria, hematuria,  rash, arthralgias, visual complaints, headache, numbness, weakness or ataxia or problems with walking or coordination,  change in mood or  memory.        No meds      Past Medical History:  Diagnosis Date   Anemia    Back pain    Knee pain    Migraines    S/P bilateral breast reduction    S/P tubal ligation         Objective:       Wt Readings from Last 3 Encounters:  02/08/22 250 lb 6.4 oz (113.6 kg)  01/18/22 245 lb 12.8 oz (111.5 kg)  01/04/22 245 lb (111.1 kg)      Vital signs reviewed  02/08/2022  - Note at rest 02 sats  100% on RA   General appearance:    amb pleasant bf freq cough and throat clearing    HEENT : Oropharynx  clear      Nasal turbinates nl    NECK :  without  apparent JVD/  palpable Nodes/TM    LUNGS: no acc muscle use,  Nl contour chest which is clear to A and P bilaterally without cough on insp or exp maneuvers   CV:  RRR  no s3 or murmur or increase in P2, and no edema   ABD:  soft and nontender   MS:  Nl gait/ ext warm without deformities Or obvious joint restrictions  calf tenderness, cyanosis or clubbing    SKIN: warm and dry without lesions    NEURO:  alert, approp, nl sensorium with  no motor or cerebellar deficits apparent.          Assessment       Outpatient Encounter Medications as of 02/08/2022  Medication Sig   benzonatate (TESSALON) 200 MG capsule Take 1 capsule (200 mg total) by mouth 3 (three) times daily as needed for cough.   predniSONE (DELTASONE) 10 MG tablet Take  4 each am x 2 days,   2 each am x 2 days,  1 each am x 2 days and stop   famotidine (PEPCID) 20 MG tablet One after supper   pantoprazole (PROTONIX) 40 MG tablet Take 1 tablet (40 mg total) by mouth daily. Take 30-60 min before first meal of the day

## 2022-02-08 NOTE — Patient Instructions (Addendum)
For cough tessalon pearls every 6 hours as needed   Prednisone 10 mg take  4 each am x 2 days,   2 each am x 2 days,  1 each am x 2 days and stop   Pantoprazole (protonix) 40 mg   Take  30-60 min before first meal of the day and Pepcid (famotidine)  20 mg after supper  for a full  3 months   GERD (REFLUX)  is an extremely common cause of respiratory symptoms just like yours , many times with no obvious heartburn at all.    It can be treated with medication, but also with lifestyle changes including elevation of the head of your bed (ideally with 6 -8inch blocks under the headboard of your bed),  Smoking cessation, avoidance of late meals, excessive alcohol, and avoid fatty foods, chocolate, peppermint, colas, red wine, and acidic juices such as orange juice.  NO MINT OR MENTHOL PRODUCTS SO NO COUGH DROPS  USE SUGARLESS CANDY INSTEAD (Jolley ranchers or Stover's or Life Savers) or even ice chips will also do - the key is to swallow to prevent all throat clearing. NO OIL BASED VITAMINS - use powdered substitutes.  Avoid fish oil when coughing.   For drainage / throat tickle try take CHLORPHENIRAMINE  4 mg  ("Allergy Relief" 4mg   at [facility] should be easiest to find in the blue box usually on bottom shelf)  take   start with just a dose or two an hour before bedtime.  My office will be contacting you by phone for referral to allergy testing   - if you don't hear back from my office within one week please call [facility] back or notify us thru MyChart and we'll address it right away.   Take all medications with you to each visit

## 2022-02-09 ENCOUNTER — Encounter: Payer: Self-pay | Admitting: Internal Medicine

## 2022-02-09 NOTE — Assessment & Plan Note (Signed)
nset in her 40s lasting months at a time then up to a year s symptoms or need for meds/ worse since 2020 covid (neg testing) - cough worse on use of saba/trelegy  01/25/2022  - FENO 01/25/2022 = 14 - Spirometry 01/25/2022  nl off trelegy x 3 days  - Allergy screen 01/25/2022 >  Eos 0. 3/  IgE  1502 > referred to allergy 02/08/2022  - cyclical cough rx 01/25/2022 and off inhalers > did not follow instructions - 02/08/2022 repeat cough protocol but use tessalon instead of tramadol and added  1st gen H1 blockers per guidelines  at hs for noct cough component   Advised: The standardized cough guidelines published in Chest by [place] in 2006 are still the best available and consist of a multiple step process (up to 12!) , not a single office visit,  and are intended  to address this problem logically,  with an alogrithm dependent on response to empiric treatment at  each progressive step  to determine a specific diagnosis with  minimal addtional testing needed. Therefore if adherence is an issue or can't be accurately verified,  it's very unlikely the standard evaluation and treatment will be successful here.    Furthermore, response to therapy (other than acute cough suppression, which should only be used short term with avoidance of narcotic containing cough syrups if possible), can be a gradual process for which the patient is not likely to  perceive immediate benefit.  Unlike going to an eye doctor where the best perscription is almost always the first one and is immediately effective, this is almost never the case in the management of chronic cough syndromes. Therefore the patient needs to commit up front to consistently adhere to recommendations  for up to 6 weeks of therapy directed at the likely underlying problem(s) before the response can be reasonably evaluated.   If cough not controlled on above prior to her eval by allergy can see her back here but this time with all meds in hand using a  trust but verify approach to confirm accurate Medication  Reconciliation The principal here is that until we are certain that the  patients are doing what we've asked, it makes no sense to ask them to do more.   Discussed in detail all the  indications, usual  risks and alternatives  relative to the benefits with patient who agrees to proceed with Rx as outlined.             Each maintenance medication was reviewed in detail including emphasizing most importantly the difference between maintenance and prns and under what circumstances the prns are to be triggered using an action plan format where appropriate.  Total time for H and P, chart review, counseling,  and generating customized AVS unique to this office visit / same day charting = 33 min for refractory chronic cough of ? Etiology

## 2022-08-06 ENCOUNTER — Ambulatory Visit (INDEPENDENT_AMBULATORY_CARE_PROVIDER_SITE_OTHER): Payer: Managed Care, Other (non HMO) | Admitting: Family Medicine

## 2022-08-06 ENCOUNTER — Encounter: Payer: Self-pay | Admitting: Family Medicine

## 2022-08-06 VITALS — BP 143/95 | HR 67 | Ht 67.5 in | Wt 256.4 lb

## 2022-08-06 DIAGNOSIS — F32A Depression, unspecified: Secondary | ICD-10-CM

## 2022-08-06 DIAGNOSIS — R030 Elevated blood-pressure reading, without diagnosis of hypertension: Secondary | ICD-10-CM

## 2022-08-06 DIAGNOSIS — Z566 Other physical and mental strain related to work: Secondary | ICD-10-CM | POA: Diagnosis not present

## 2022-08-06 DIAGNOSIS — F419 Anxiety disorder, unspecified: Secondary | ICD-10-CM | POA: Diagnosis not present

## 2022-08-06 NOTE — Assessment & Plan Note (Signed)
PHQ-9 score of 21 today with response of "extremely difficult". No SI. Seems majority of her symptoms are related to unhealthy work situation and burnout. Don't think medication would have significant impact as it will not address root cause. Patient to bring FMLA paperwork and I will complete. Encouraged therapy, especially during her leave of absence, to work on coping mechanisms and boundary setting for when she returns. Follow up in ~2 weeks.

## 2022-08-06 NOTE — Patient Instructions (Addendum)
It was great to meet you!  If you bring FMLA paperwork to our office, I will complete it for you.  Please schedule a visit in 2 weeks to follow-up on your stress/anxiety/depression and your blood pressure.  I highly recommend therapy to help you cope with the stressors of your job and to help with boundary setting.  To schedule an appointment with a therapist, contact: [facility]  [address] [place], [place] [phone] Crisis [phone]  You can also use [email or web address] to find a therapist If you prefer virtual therapy, there are also apps such as [employer]kspace or BetterHelp  Take care, Dr Anner Crete

## 2022-08-06 NOTE — Assessment & Plan Note (Signed)
BP elevated today x2. No prior history of HTN. Patient under extreme stress as above. Follow up in 2 weeks.

## 2022-08-06 NOTE — Progress Notes (Signed)
    SUBJECTIVE:   CHIEF COMPLAINT / HPI:   Stress -patient reports "extreme stress" for approximately 2 months now -worse over past 2 weeks -attributes it entirely to her job -overwhelmed with her amount of responsibility at work -works as Nature conservation officer. Works remotely. Works 7 days per week -even during her "time off", people from work are calling her -2 other people in her position quit, she absorbed their workload -not sleeping well, states her brain won't shut off -states she can't focus, getting headaches -also having anxiety attacks -sometimes just screams -feels she's now getting so behind in her personal life -requesting FMLA (~3 weeks) -unable to attend therapy right now because she's constantly working, doesn't have time  PERTINENT  PMH / PSH: reviewed  OBJECTIVE:   BP (!) 143/95   Pulse 67   Ht 5' 7.5" (1.715 m)   Wt 256 lb 6.4 oz (116.3 kg)   LMP 07/11/2022   SpO2 98%   BMI 39.57 kg/m   General: NAD, able to participate in exam Respiratory: No respiratory distress Skin: warm and dry, no rashes noted Psych: well-groomed, appropriate eye contact, mildly anxious, visibly overwhelmed when discussing work Neuro: grossly intact  ASSESSMENT/PLAN:   Anxiety and depression PHQ-9 score of 21 today with response of "extremely difficult". No SI. Seems majority of her symptoms are related to unhealthy work situation and burnout. Don't think medication would have significant impact as it will not address root cause. Patient to bring FMLA paperwork and I will complete. Encouraged therapy, especially during her leave of absence, to work on coping mechanisms and boundary setting for when she returns. Follow up in ~2 weeks.  Elevated blood pressure reading BP elevated today x2. No prior history of HTN. Patient under extreme stress as above. Follow up in 2 weeks.     Maury Dus, MD [facility]

## 2022-08-09 ENCOUNTER — Telehealth: Payer: Self-pay | Admitting: Family Medicine

## 2022-08-09 NOTE — Telephone Encounter (Signed)
Form is placed in your box, please complete whenever you have time. Thank you. Penni Bombard CMA

## 2022-08-09 NOTE — Telephone Encounter (Signed)
Patient dropped off form at front desk for [name].  Verified that patient section of form has been completed.  Last DOS/WCC with PCP was 08/06/2022.  Placed form in Red team folder to be completed by clinical staff.  Christina Sparks

## 2022-08-10 NOTE — Telephone Encounter (Signed)
Patient walked in to pick up forms I told her they were not ready. She said the doctor told her it would be ready the next day. She is needing it by this afternoon due to her being out of town on Monday and her job needing it.   Call patient when ready for pick up.   Please Advise.  Thanks!

## 2022-08-10 NOTE — Telephone Encounter (Signed)
Called patient to inform her the form was ready for pick up but she did not answer. I left a detailed VM letting her know it is ready and she can pick it up before the office closes. Penni Bombard CMA

## 2022-08-10 NOTE — Telephone Encounter (Signed)
Hey, I put it in your box patient. I got a message stating the patient needed her paperwork today, is it possible for you to complete it? I will update patient today on what is decided.  Thanks!  Penni Bombard CMA

## 2022-08-14 ENCOUNTER — Telehealth: Payer: Self-pay | Admitting: Internal Medicine

## 2022-08-14 NOTE — Telephone Encounter (Signed)
PT's cough has not gone away. She wonders if Dr. Sherene Sires will put her back on Antibx.  Pharm is [facility] on [address]  Her # is [phone]  Jamal Maes to get appt but too far out for the relief she needs.

## 2022-08-16 NOTE — Telephone Encounter (Signed)
Pt will need to be seen for an appt prior to receiving antibiotic. Attempted to call pt but unable to reach. Left her a detailed message letting her know that she will need to be seen for an appt. Nothing further needed.

## 2022-08-16 NOTE — Telephone Encounter (Signed)
Patient is calling again because she has not heard back regarding getting an antibiotic for her constant cough.  Please call patient back with an update.  CB# [phone]

## 2022-08-19 ENCOUNTER — Emergency Department (HOSPITAL_COMMUNITY): Payer: Managed Care, Other (non HMO)

## 2022-08-19 ENCOUNTER — Encounter (HOSPITAL_COMMUNITY): Payer: Self-pay | Admitting: Emergency Medicine

## 2022-08-19 ENCOUNTER — Other Ambulatory Visit: Payer: Self-pay

## 2022-08-19 ENCOUNTER — Emergency Department (HOSPITAL_COMMUNITY)
Admission: EM | Admit: 2022-08-19 | Discharge: 2022-08-19 | Disposition: A | Discharge status: Home / Self Care | Payer: Managed Care, Other (non HMO) | Attending: Emergency Medicine | Admitting: Emergency Medicine

## 2022-08-19 DIAGNOSIS — R052 Subacute cough: Secondary | ICD-10-CM | POA: Diagnosis not present

## 2022-08-19 DIAGNOSIS — R0602 Shortness of breath: Secondary | ICD-10-CM | POA: Insufficient documentation

## 2022-08-19 DIAGNOSIS — R059 Cough, unspecified: Secondary | ICD-10-CM | POA: Diagnosis present

## 2022-08-19 MED ORDER — IPRATROPIUM-ALBUTEROL 0.5-2.5 (3) MG/3ML IN SOLN
3.0000 mL | Freq: Once | RESPIRATORY_TRACT | Status: AC
  Administered 2022-08-19: 3 mL via RESPIRATORY_TRACT
  Filled 2022-08-19: qty 3

## 2022-08-19 MED ORDER — PREDNISONE 10 MG (21) PO TBPK: ORAL_TABLET | Freq: Every day | ORAL | 0 refills | Status: DC

## 2022-08-19 MED ORDER — PREDNISONE 20 MG PO TABS
60.0000 mg | ORAL_TABLET | Freq: Once | ORAL | Status: AC
  Administered 2022-08-19: 60 mg via ORAL
  Filled 2022-08-19: qty 3

## 2022-08-19 MED ORDER — ALBUTEROL SULFATE HFA 108 (90 BASE) MCG/ACT IN AERS
2.0000 | INHALATION_SPRAY | RESPIRATORY_TRACT | Status: DC | PRN
  Administered 2022-08-19: 2 via RESPIRATORY_TRACT
  Filled 2022-08-19: qty 6.7

## 2022-08-19 MED ORDER — METHYLPREDNISOLONE SODIUM SUCC 125 MG IJ SOLR: 125.0000 mg | INTRAMUSCULAR | Status: DC

## 2022-08-19 MED ORDER — HYDROCOD POLI-CHLORPHE POLI ER 10-8 MG/5ML PO SUER
5.0000 mL | Freq: Once | ORAL | Status: AC
  Administered 2022-08-19: 5 mL via ORAL
  Filled 2022-08-19: qty 5

## 2022-08-19 MED ORDER — PROMETHAZINE-DM 6.25-15 MG/5ML PO SYRP: 5.0000 mL | ORAL_SOLUTION | Freq: Four times a day (QID) | ORAL | 0 refills | Status: DC | PRN

## 2022-08-19 MED ORDER — BENZONATATE 100 MG PO CAPS: 100.0000 mg | ORAL_CAPSULE | Freq: Three times a day (TID) | ORAL | 0 refills | Status: DC

## 2022-08-19 MED ORDER — BENZONATATE 100 MG PO CAPS
200.0000 mg | ORAL_CAPSULE | Freq: Once | ORAL | Status: AC
  Administered 2022-08-19: 200 mg via ORAL
  Filled 2022-08-19: qty 2

## 2022-08-19 NOTE — ED Triage Notes (Signed)
Pt presents for cough, SOB, L side pain. She has experienced this before 6 months ago and was given abx. Pt is a smoker. Wheeze bilaterally. Last inhaler use 2 hrs ago

## 2022-08-19 NOTE — Progress Notes (Deleted)
Christina Sparks, female    DOB: October 05, 1970   MRN: [id number]   Brief patient profile:  62 yobf  never smoker referred to pulmonary clinic 01/25/2022 by Terisa Starr for refractory cough  With a pattern of cough that lasts months to a year starting  in her 56s ? Responsive to [facility] well for up to a year and then worse "with the cold weather" but  since  the year of covid 19 came and stayed though never tested pos for covid and no response to trelegy   Baseline wt 215 in her 40s  peak was 278   History of Present Illness  01/25/2022  Pulmonary/ 1st office eval/Claus Silvestro  Chief Complaint  Patient presents with   Consult  Dyspnea:  not aerobically active but loses breath to speak/not walk   Cough: yellowish better at bedtime as long as rotates off the L side / sma SABA use: not helpful, makes her cough  L cp with cough rad front to back recurrent x years  same pattern with neg ct x 2, one of which CTa  06/14/19 Rec Mucinex dm 1200 mg twice daily and supplement with tramadol 50 mg  1 every 4 hours as needed Prednisone 10 mg take  4 each am x 2 days,   2 each am x 2 days,  1 each am x 2 days and stop  Pantoprazole (protonix) 40 mg   Take  30-60 min before first meal of the day and Pepcid (famotidine)  20 mg after supper until return to office - GERD diet reviewed, bed blocks rec   lab and x-ray department  for your tests - we will call you with the results when they are available. Please schedule a follow up office visit in 2  weeks, sooner if needed    Allergy screen 01/25/2022 >  Eos 0. 3/  IgE  1502   02/08/2022  f/u ov/Meenakshi Sazama re: cough   maint on no rx   Chief Complaint  Patient presents with   Follow-up    Feeling better 85%. Cough-yellow, not as hoarse, slight sob  Dyspnea:  loses breath to speak, not walk  Cough: bad cough each am slt yellowish mucus  Sleeping: flat bed ok for a few hours then wakes up cough w/in a few hours q noct assoc with thorat tickle  SABA use: none  02: none   Covid status:   vax one injections Rec For cough tessalon pearls every 6 hours as needed  Prednisone 10 mg take  4 each am x 2 days,   2 each am x 2 days,  1 each am x 2 days and stop  Pantoprazole (protonix) 40 mg   Take  30-60 min before first meal of the day and Pepcid (famotidine)  20 mg after supper  for a full  3 months  GERD diet reviewed, bed blocks rec   For drainage / throat tickle try take CHLORPHENIRAMINE  4 mg   My office will be contacting you by phone for referral to allergy testing   - if you don't hear back from my office within one week please call us back or notify us thru MyChart and we'll address it right away.   Take all medications with you to each visit    08/21/2022  f/u ov/Bryant Saye re: cough / atopic    maint on ***  No chief complaint on file.   Dyspnea:  *** Cough: *** Sleeping: *** SABA use: *** 02: ***  Covid status:   *** Lung cancer screening :  ***    No obvious day to day or daytime variability or assoc excess/ purulent sputum or mucus plugs or hemoptysis or cp or chest tightness, subjective wheeze or overt sinus or hb symptoms.   *** without nocturnal  or early am exacerbation  of respiratory  c/o's or need for noct saba. Also denies any obvious fluctuation of symptoms with weather or environmental changes or other aggravating or alleviating factors except as outlined above   No unusual exposure hx or h/o childhood pna/ asthma or knowledge of premature birth.  Current Allergies, Complete Past Medical History, Past Surgical History, Family History, and Social History were reviewed in [employer] record.  ROS  The following are not active complaints unless bolded Hoarseness, sore throat, dysphagia, dental problems, itching, sneezing,  nasal congestion or discharge of excess mucus or purulent secretions, ear ache,   fever, chills, sweats, unintended wt loss or wt gain, classically pleuritic or exertional cp,  orthopnea pnd or  arm/hand swelling  or leg swelling, presyncope, palpitations, abdominal pain, anorexia, nausea, vomiting, diarrhea  or change in bowel habits or change in bladder habits, change in stools or change in urine, dysuria, hematuria,  rash, arthralgias, visual complaints, headache, numbness, weakness or ataxia or problems with walking or coordination,  change in mood or  memory.        No outpatient medications have been marked as taking for the 08/21/22 encounter (Appointment) with Nyoka Cowden, MD.              Past Medical History:  Diagnosis Date   Anemia    Back pain    Knee pain    Migraines    S/P bilateral breast reduction    S/P tubal ligation         Objective:    wts   08/21/2022       ***   02/08/22 250 lb 6.4 oz (113.6 kg)  01/18/22 245 lb 12.8 oz (111.5 kg)  01/04/22 245 lb (111.1 kg)      Vital signs reviewed  08/21/2022  - Note at rest 02 sats  ***% on ***   General appearance:    ***          Assessment

## 2022-08-19 NOTE — ED Provider Notes (Incomplete)
[place] EMERGENCY DEPARTMENT AT [facility] Provider Note   CSN: [id number] Arrival date & time: 08/19/22  0141     History {Add pertinent medical, surgical, social history, OB history to HPI:1} Chief Complaint  Patient presents with  . Shortness of Breath    MEILAH HEMPEL is a 52 y.o. female.   Shortness of Breath Patient is a 52 year old female with past medical history significant for back pain, anemia, migraines  Patient presents to the emergency room today with complaints of cough.  Seems that she has had issues with coughing over the past decade.  Seems that she will have episodes where she coughs for a month or more at a time.  She has been trialed on Trelegy inhaler in the past and has not had improvement with that she has had multiple doses of oral steroids with some improvement.   She tells me that she has been following with pulmonology.  She initially thought that she had been treated antibiotics however after some clarification and review of notes we were able to confirm that she was not treated with antibiotics.  Seems that benzonatate and prednisone were primarily used for treatment.  No recent surgeries, hospitalization, long travel, hemoptysis, estrogen containing OCP, cancer history. No history of PE or VTE.     Home Medications Prior to Admission medications   Medication Sig Start Date End Date Taking? Authorizing Provider  benzonatate (TESSALON) 100 MG capsule Take 1 capsule (100 mg total) by mouth every 8 (eight) hours. 08/19/22  Yes Gianlucas Evenson S, PA  predniSONE (STERAPRED UNI-PAK 21 TAB) 10 MG (21) TBPK tablet Take by mouth daily. Take 6 tabs by mouth daily  for 2 days, then 5 tabs for 2 days, then 4 tabs for 2 days, then 3 tabs for 2 days, 2 tabs for 2 days, then 1 tab by mouth daily for 2 days 08/19/22  Yes Tiziana Cislo S, PA  promethazine-dextromethorphan (PROMETHAZINE-DM) 6.25-15 MG/5ML syrup Take 5 mLs by mouth 4 (four) times daily as  needed for cough. 08/19/22  Yes Gailen Shelter, PA  famotidine (PEPCID) 20 MG tablet One after supper 02/08/22   Nyoka Cowden, MD  pantoprazole (PROTONIX) 40 MG tablet Take 1 tablet (40 mg total) by mouth daily. Take 30-60 min before first meal of the day 02/08/22   Nyoka Cowden, MD      Allergies    Sulfa drugs cross reactors    Review of Systems   Review of Systems  Respiratory:  Positive for shortness of breath.     Physical Exam Updated Vital Signs BP 127/84 (BP Location: Right Arm)   Pulse 85   Temp 98.1 F (36.7 C) (Oral)   Resp 18   LMP 07/11/2022   SpO2 97%  Physical Exam Vitals and nursing note reviewed.  Constitutional:      General: She is not in acute distress. HENT:     Head: Normocephalic and atraumatic.     Nose: Nose normal.  Eyes:     General: No scleral icterus. Cardiovascular:     Rate and Rhythm: Normal rate and regular rhythm.     Pulses: Normal pulses.     Heart sounds: Normal heart sounds.  Pulmonary:     Effort: Pulmonary effort is normal. No respiratory distress.     Breath sounds: No wheezing.     Comments: Lungs clear to auscultation, no tachypnea, SpO2 between 97 and 98% on room air, no wheezing or crackles Abdominal:  Palpations: Abdomen is soft.     Tenderness: There is no abdominal tenderness.  Musculoskeletal:     Cervical back: Normal range of motion.     Right lower leg: No edema.     Left lower leg: No edema.     Comments: Legs symmetric  Skin:    General: Skin is warm and dry.     Capillary Refill: Capillary refill takes less than 2 seconds.  Neurological:     Mental Status: She is alert. Mental status is at baseline.  Psychiatric:        Mood and Affect: Mood normal.        Behavior: Behavior normal.     ED Results / Procedures / Treatments   Labs (all labs ordered are listed, but only abnormal results are displayed) Labs Reviewed - No data to display  EKG EKG Interpretation  Date/Time:  Sunday Aug 19 2022 01:50:44 EDT Ventricular Rate:  93 PR Interval:  154 QRS Duration: 130 QT Interval:  386 QTC Calculation: 479 R Axis:   35 Text Interpretation: Normal sinus rhythm Right bundle branch block Abnormal ECG When compared with ECG of 11-Aug-2020 18:54, PREVIOUS ECG IS PRESENT Confirmed by Ross Marcus ([id number]) on 08/19/2022 1:53:41 AM  Radiology DG Chest 2 View  Result Date: 08/19/2022 CLINICAL DATA:  Shortness of breath EXAM: CHEST - 2 VIEW COMPARISON:  None Available. FINDINGS: The heart size and mediastinal contours are within normal limits. Both lungs are clear. The visualized skeletal structures are unremarkable. IMPRESSION: No active cardiopulmonary disease. Electronically Signed   By: Deatra Robinson M.D.   On: 08/19/2022 02:34    Procedures Procedures  {Document cardiac monitor, telemetry assessment procedure when appropriate:1}  Medications Ordered in ED Medications  albuterol (VENTOLIN HFA) 108 (90 Base) MCG/ACT inhaler 2 puff (2 puffs Inhalation Given 08/19/22 0207)  chlorpheniramine-HYDROcodone (TUSSIONEX) 10-8 MG/5ML suspension 5 mL (5 mLs Oral Given 08/19/22 0403)  benzonatate (TESSALON) capsule 200 mg (200 mg Oral Given 08/19/22 0403)  ipratropium-albuterol (DUONEB) 0.5-2.5 (3) MG/3ML nebulizer solution 3 mL (3 mLs Nebulization Given 08/19/22 0404)  predniSONE (DELTASONE) tablet 60 mg (60 mg Oral Given 08/19/22 0403)    ED Course/ Medical Decision Making/ A&P Clinical Course as of 08/19/22 0702  Sun Aug 19, 2022  0355 IMPRESSION: 1. No findings to suggest acute pulmonary embolism. 2. Few patchy areas of subpleural opacity anterior segment right upper lobe and basal segments of the left and right lower lobes. Appearance is nonspecific and may reflect acute infection or inflammation. 3. Cholelithiasis with no findings to suggest acute cholecystitis.  [WF]    Clinical Course User Index [WF] Gailen Shelter, PA   {   Click here for ABCD2, HEART and other  calculatorsREFRESH Note before signing :1}                          Medical Decision Making Amount and/or Complexity of Data Reviewed Radiology: ordered.  Risk Prescription drug management.   This patient presents to the ED for concern of ***, this involves a number of treatment options, and is a complaint that carries with it a *** risk of complications and morbidity. A differential diagnosis was considered for the patient's symptoms which is discussed below:   ***   Co morbidities: Discussed in HPI   Brief History:  Patient is a 52 year old female with past medical history significant for back pain, anemia, migraines  Patient presents to the emergency room today  with complaints of cough.  Seems that she has had issues with coughing over the past decade.  Seems that she will have episodes where she coughs for a month or more at a time.  She has been trialed on Trelegy inhaler in the past and has not had improvement with that she has had multiple doses of oral steroids with some improvement.   She tells me that she has been following with pulmonology.  She initially thought that she had been treated antibiotics however after some clarification and review of notes we were able to confirm that she was not treated with antibiotics.  Seems that benzonatate and prednisone were primarily used for treatment.  No recent surgeries, hospitalization, long travel, hemoptysis, estrogen containing OCP, cancer history. No history of PE or VTE.       EMR reviewed including pt PMHx, past surgical history and past visits to ER.   See HPI for more details   Lab Tests:   {Blank single:19197::"I ordered and independently interpreted labs. Labs notable for","I personally reviewed all laboratory work and imaging. Metabolic panel without any acute abnormality specifically kidney function within normal limits and no significant electrolyte abnormalities. CBC without leukocytosis or significant  anemia."}   Imaging Studies:  {Blank single:19197::"NAD. I personally reviewed all imaging studies and no acute abnormality found. I agree with radiology interpretation.","Abnormal findings. I personally reviewed all imaging studies. Imaging notable for","No imaging studies ordered for this patient"}    Cardiac Monitoring:  .{Blank single:19197::"The patient was maintained on a cardiac monitor.  I personally viewed and interpreted the cardiac monitored which showed an underlying rhythm of:","NA"} .{Blank single:19197::"EKG non-ischemic","NA"}   Medicines ordered:  I ordered medication including ***  for *** Reevaluation of the patient after these medicines showed that the patient {resolved/improved/worsened:23923::"improved"} I have reviewed the patients home medicines and have made adjustments as needed   Critical Interventions:  .***   Consults/Attending Physician   .{Blank single:19197::"I requested consultation with ***,  and discussed lab and imaging findings as well as pertinent plan - they recommend: ***","I discussed this case with my attending physician who cosigned this note including patient's presenting symptoms, physical exam, and planned diagnostics and interventions. Attending physician stated agreement with plan or made changes to plan which were implemented."}   Reevaluation:  After the interventions noted above I re-evaluated patient and found that they have :{resolved/improved/worsened:23923::"improved"}   Social Determinants of Health:  Marland Kitchen{Blank single:19197::"Given cab voucher","Social work/case management involved","The patient's social determinants of health were a factor in the care of this patient"}    Problem List / ED Course:  ***   Dispostion:  After consideration of the diagnostic results and the patients response to treatment, I feel that the patent would benefit from ***       Final Clinical Impression(s) / ED Diagnoses Final  diagnoses:  Subacute cough    Rx / DC Orders ED Discharge Orders          Ordered    predniSONE (STERAPRED UNI-PAK 21 TAB) 10 MG (21) TBPK tablet  Daily        08/19/22 0554    benzonatate (TESSALON) 100 MG capsule  Every 8 hours        08/19/22 0554    promethazine-dextromethorphan (PROMETHAZINE-DM) 6.25-15 MG/5ML syrup  4 times daily PRN        08/19/22 0554

## 2022-08-19 NOTE — ED Notes (Signed)
Ambulatory SpO2 96%

## 2022-08-19 NOTE — Discharge Instructions (Addendum)
Please follow-up with your pulmonologist.  I have sent your prescriptions to the [facility]

## 2022-08-19 NOTE — ED Provider Notes (Signed)
[place] EMERGENCY DEPARTMENT AT [facility] Provider Note   CSN: [id number] Arrival date & time: 08/19/22  0141     History {Add pertinent medical, surgical, social history, OB history to HPI:1} Chief Complaint  Patient presents with   Shortness of Breath    SOLIE HOOTMAN is a 52 y.o. female.   Shortness of Breath Patient is a 52 year old female with past medical history significant for back pain, anemia, migraines  Patient presents to the emergency room today with complaints of cough.  Seems that she has had issues with coughing over the past decade.  Seems that she will have episodes where she coughs for a month or more at a time.  She has been trialed on Trelegy inhaler in the past and has not had improvement with that she has had multiple doses of oral steroids with some improvement.   She tells me that she has been following with pulmonology.  She initially thought that she had been treated antibiotics however after some clarification and review of notes we were able to confirm that she was not treated with antibiotics.  Seems that benzonatate and prednisone were primarily used for treatment.  No recent surgeries, hospitalization, long travel, hemoptysis, estrogen containing OCP, cancer history. No history of PE or VTE.     Home Medications Prior to Admission medications   Medication Sig Start Date End Date Taking? Authorizing Provider  benzonatate (TESSALON) 100 MG capsule Take 1 capsule (100 mg total) by mouth every 8 (eight) hours. 08/19/22  Yes Xavian Hardcastle S, PA  predniSONE (STERAPRED UNI-PAK 21 TAB) 10 MG (21) TBPK tablet Take by mouth daily. Take 6 tabs by mouth daily  for 2 days, then 5 tabs for 2 days, then 4 tabs for 2 days, then 3 tabs for 2 days, 2 tabs for 2 days, then 1 tab by mouth daily for 2 days 08/19/22  Yes Felisha Claytor S, PA  promethazine-dextromethorphan (PROMETHAZINE-DM) 6.25-15 MG/5ML syrup Take 5 mLs by mouth 4 (four) times daily as  needed for cough. 08/19/22  Yes Gailen Shelter, PA  famotidine (PEPCID) 20 MG tablet One after supper 02/08/22   Nyoka Cowden, MD  pantoprazole (PROTONIX) 40 MG tablet Take 1 tablet (40 mg total) by mouth daily. Take 30-60 min before first meal of the day 02/08/22   Nyoka Cowden, MD      Allergies    Sulfa drugs cross reactors    Review of Systems   Review of Systems  Respiratory:  Positive for shortness of breath.     Physical Exam Updated Vital Signs BP 127/84 (BP Location: Right Arm)   Pulse 85   Temp 98.1 F (36.7 C) (Oral)   Resp 18   LMP 07/11/2022   SpO2 97%  Physical Exam Vitals and nursing note reviewed.  Constitutional:      General: She is not in acute distress. HENT:     Head: Normocephalic and atraumatic.     Nose: Nose normal.  Eyes:     General: No scleral icterus. Cardiovascular:     Rate and Rhythm: Normal rate and regular rhythm.     Pulses: Normal pulses.     Heart sounds: Normal heart sounds.  Pulmonary:     Effort: Pulmonary effort is normal. No respiratory distress.     Breath sounds: No wheezing.     Comments: Lungs clear to auscultation, no tachypnea, SpO2 between 97 and 98% on room air, no wheezing or crackles Abdominal:  Palpations: Abdomen is soft.     Tenderness: There is no abdominal tenderness.  Musculoskeletal:     Cervical back: Normal range of motion.     Right lower leg: No edema.     Left lower leg: No edema.     Comments: Legs appear symmetric  Skin:    General: Skin is warm and dry.     Capillary Refill: Capillary refill takes less than 2 seconds.  Neurological:     Mental Status: She is alert. Mental status is at baseline.  Psychiatric:        Mood and Affect: Mood normal.        Behavior: Behavior normal.     ED Results / Procedures / Treatments   Labs (all labs ordered are listed, but only abnormal results are displayed) Labs Reviewed - No data to display  EKG EKG Interpretation  Date/Time:  Sunday Aug 19 2022 01:50:44 EDT Ventricular Rate:  93 PR Interval:  154 QRS Duration: 130 QT Interval:  386 QTC Calculation: 479 R Axis:   35 Text Interpretation: Normal sinus rhythm Right bundle branch block Abnormal ECG When compared with ECG of 11-Aug-2020 18:54, PREVIOUS ECG IS PRESENT Confirmed by Ross Marcus ([id number]) on 08/19/2022 1:53:41 AM  Radiology DG Chest 2 View  Result Date: 08/19/2022 CLINICAL DATA:  Shortness of breath EXAM: CHEST - 2 VIEW COMPARISON:  None Available. FINDINGS: The heart size and mediastinal contours are within normal limits. Both lungs are clear. The visualized skeletal structures are unremarkable. IMPRESSION: No active cardiopulmonary disease. Electronically Signed   By: Deatra Robinson M.D.   On: 08/19/2022 02:34    Procedures Procedures  {Document cardiac monitor, telemetry assessment procedure when appropriate:1}  Medications Ordered in ED Medications  albuterol (VENTOLIN HFA) 108 (90 Base) MCG/ACT inhaler 2 puff (2 puffs Inhalation Given 08/19/22 0207)  chlorpheniramine-HYDROcodone (TUSSIONEX) 10-8 MG/5ML suspension 5 mL (5 mLs Oral Given 08/19/22 0403)  benzonatate (TESSALON) capsule 200 mg (200 mg Oral Given 08/19/22 0403)  ipratropium-albuterol (DUONEB) 0.5-2.5 (3) MG/3ML nebulizer solution 3 mL (3 mLs Nebulization Given 08/19/22 0404)  predniSONE (DELTASONE) tablet 60 mg (60 mg Oral Given 08/19/22 0403)    ED Course/ Medical Decision Making/ A&P Clinical Course as of 08/19/22 0702  Sun Aug 19, 2022  0355 IMPRESSION: 1. No findings to suggest acute pulmonary embolism. 2. Few patchy areas of subpleural opacity anterior segment right upper lobe and basal segments of the left and right lower lobes. Appearance is nonspecific and may reflect acute infection or inflammation. 3. Cholelithiasis with no findings to suggest acute cholecystitis.  [WF]    Clinical Course User Index [WF] Gailen Shelter, PA   {   Click here for ABCD2, HEART and other  calculatorsREFRESH Note before signing :1}                          Medical Decision Making Amount and/or Complexity of Data Reviewed Radiology: ordered.  Risk Prescription drug management.   This patient presents to the ED for concern of ***, this involves a number of treatment options, and is a complaint that carries with it a *** risk of complications and morbidity. A differential diagnosis was considered for the patient's symptoms which is discussed below:   ***   Co morbidities: Discussed in HPI   Brief History:  Patient is a 52 year old female with past medical history significant for back pain, anemia, migraines  Patient presents to the emergency room  today with complaints of cough.  Seems that she has had issues with coughing over the past decade.  Seems that she will have episodes where she coughs for a month or more at a time.  She has been trialed on Trelegy inhaler in the past and has not had improvement with that she has had multiple doses of oral steroids with some improvement.   She tells me that she has been following with pulmonology.  She initially thought that she had been treated antibiotics however after some clarification and review of notes we were able to confirm that she was not treated with antibiotics.  Seems that benzonatate and prednisone were primarily used for treatment.  No recent surgeries, hospitalization, long travel, hemoptysis, estrogen containing OCP, cancer history. No history of PE or VTE.       EMR reviewed including pt PMHx, past surgical history and past visits to ER.   See HPI for more details   Lab Tests:   {Blank single:19197::"I ordered and independently interpreted labs. Labs notable for","I personally reviewed all laboratory work and imaging. Metabolic panel without any acute abnormality specifically kidney function within normal limits and no significant electrolyte abnormalities. CBC without leukocytosis or significant  anemia."}   Imaging Studies:  {Blank single:19197::"NAD. I personally reviewed all imaging studies and no acute abnormality found. I agree with radiology interpretation.","Abnormal findings. I personally reviewed all imaging studies. Imaging notable for","No imaging studies ordered for this patient"}    Cardiac Monitoring:  {Blank single:19197::"The patient was maintained on a cardiac monitor.  I personally viewed and interpreted the cardiac monitored which showed an underlying rhythm of:","NA"} {Blank single:19197::"EKG non-ischemic","NA"}   Medicines ordered:  I ordered medication including ***  for *** Reevaluation of the patient after these medicines showed that the patient {resolved/improved/worsened:23923::"improved"} I have reviewed the patients home medicines and have made adjustments as needed   Critical Interventions:  ***   Consults/Attending Physician   {Blank single:19197::"I requested consultation with ***,  and discussed lab and imaging findings as well as pertinent plan - they recommend: ***","I discussed this case with my attending physician who cosigned this note including patient's presenting symptoms, physical exam, and planned diagnostics and interventions. Attending physician stated agreement with plan or made changes to plan which were implemented."}   Reevaluation:  After the interventions noted above I re-evaluated patient and found that they have :{resolved/improved/worsened:23923::"improved"}   Social Determinants of Health:  {Blank single:19197::"Given cab voucher","Social work/case management involved","The patient's social determinants of health were a factor in the care of this patient"}    Problem List / ED Course:  ***   Dispostion:  After consideration of the diagnostic results and the patients response to treatment, I feel that the patent would benefit from ***       Final Clinical Impression(s) / ED Diagnoses Final  diagnoses:  Subacute cough    Rx / DC Orders ED Discharge Orders          Ordered    predniSONE (STERAPRED UNI-PAK 21 TAB) 10 MG (21) TBPK tablet  Daily        08/19/22 0554    benzonatate (TESSALON) 100 MG capsule  Every 8 hours        08/19/22 0554    promethazine-dextromethorphan (PROMETHAZINE-DM) 6.25-15 MG/5ML syrup  4 times daily PRN        08/19/22 0554

## 2022-08-21 ENCOUNTER — Encounter: Payer: Self-pay | Admitting: Family Medicine

## 2022-08-21 ENCOUNTER — Telehealth: Payer: Self-pay | Admitting: Family Medicine

## 2022-08-21 ENCOUNTER — Ambulatory Visit: Payer: Self-pay | Admitting: Internal Medicine

## 2022-08-21 ENCOUNTER — Ambulatory Visit (INDEPENDENT_AMBULATORY_CARE_PROVIDER_SITE_OTHER): Payer: Managed Care, Other (non HMO) | Admitting: Family Medicine

## 2022-08-21 VITALS — BP 142/98 | HR 87 | Ht 67.5 in | Wt 257.0 lb

## 2022-08-21 DIAGNOSIS — Z6839 Body mass index (BMI) 39.0-39.9, adult: Secondary | ICD-10-CM

## 2022-08-21 DIAGNOSIS — F32A Depression, unspecified: Secondary | ICD-10-CM

## 2022-08-21 DIAGNOSIS — I10 Essential (primary) hypertension: Secondary | ICD-10-CM

## 2022-08-21 DIAGNOSIS — F419 Anxiety disorder, unspecified: Secondary | ICD-10-CM | POA: Diagnosis not present

## 2022-08-21 MED ORDER — AMLODIPINE BESYLATE 5 MG PO TABS
5.0000 mg | ORAL_TABLET | Freq: Every day | ORAL | 0 refills | Status: DC
Start: 2022-08-21 — End: 2022-09-18

## 2022-08-21 MED ORDER — SEMAGLUTIDE-WEIGHT MANAGEMENT 0.25 MG/0.5ML ~~LOC~~ SOAJ: 0.2500 mg | SUBCUTANEOUS | 0 refills | Status: DC

## 2022-08-21 NOTE — Progress Notes (Signed)
    SUBJECTIVE:   CHIEF COMPLAINT / HPI:   HTN f/u BP elevated at her last visit 2 weeks ago. No prior h/o HTN. There is a family history of HTN in her brother and both parents. Not currently checking BP at home. Of note, she is currently on day #3 of a prednisone course for cyclical cough (follows with pulm)  Stress, Anxiety, Depression Was seen 2 weeks ago for extreme stress at work resulting in anxiety and depressive symptoms.   Reports no change since her last visit. Still having a lot of anxiety, feeling overwhelmed.  Denies SI. Reports she went to the ED a few days ago for cough, was also having some shortness of breath but thinks her stress and anxiety were contributing to this.  FMLA paperwork completed previously but needs updating to start June 17th instead.  She needs a few weeks to get things organized first. Patient is not interested in medications at this time.  Feels all of her symptoms are situational related to her work environment.  Weight Management Patient inquiring about medication to help with weight loss.  PERTINENT  PMH / PSH: Migraines, GERD  OBJECTIVE:   BP (!) 142/98   Pulse 87   Ht 5' 7.5" (1.715 m)   Wt 257 lb (116.6 kg)   LMP 08/07/2022   SpO2 98%   BMI 39.66 kg/m   Gen: NAD, pleasant, able to participate in exam CV: RRR, normal S1/S2, no murmur Resp: Normal effort, lungs CTAB Extremities: no edema or cyanosis Skin: warm and dry, no rashes noted Neuro: alert, no obvious focal deficits Psych: Normal affect and mood  ASSESSMENT/PLAN:   Anxiety and depression Not well-controlled.  Significantly elevated PHQ-9 and GAD-7 scores today.  No SI.  Symptoms entirely attributed to workplace stress and burnout at this point.  I updated her FMLA paperwork for 3 week leave of absence to start 09/10/2022.  Encouraged behavioral therapy during this time.  If no improvement with FMLA leave, would recommend medication.  Hypertension New diagnosis.  BP  elevated today x2.  Has been elevated at prior visits as well. -Check BMP -Start amlodipine 5 mg daily -Advised home BP monitoring -Address obesity as below  Class 2 severe obesity with serious comorbidity and body mass index (BMI) of 39.0 to 39.9 in adult (HCC) BMI 39 with comorbidity. -Check lipid panel and A1c today -Start semaglutide 0.25 mg weekly -Encouraged lifestyle modifications simultaneous to medication use   Follow-up in 2 weeks.  Maury Dus, MD [facility]

## 2022-08-21 NOTE — Assessment & Plan Note (Signed)
BMI 39 with comorbidity. -Check lipid panel and A1c today -Start semaglutide 0.25 mg weekly -Encouraged lifestyle modifications simultaneous to medication use

## 2022-08-21 NOTE — Telephone Encounter (Signed)
Patient dropped off FMLA paperwork to be completed.Last DOS was 08/21/22. Placed in [employer].

## 2022-08-21 NOTE — Assessment & Plan Note (Signed)
Not well-controlled.  Significantly elevated PHQ-9 and GAD-7 scores today.  No SI.  Symptoms entirely attributed to workplace stress and burnout at this point.  I updated her FMLA paperwork for 3 week leave of absence to start 09/10/2022.  Encouraged behavioral therapy during this time.  If no improvement with FMLA leave, would recommend medication.

## 2022-08-21 NOTE — Assessment & Plan Note (Signed)
New diagnosis.  BP elevated today x2.  Has been elevated at prior visits as well. -Check BMP -Start amlodipine 5 mg daily -Advised home BP monitoring -Address obesity as below

## 2022-08-21 NOTE — Patient Instructions (Addendum)
It was great to see you!  Things we discussed at today's visit: - I have updated your FMLA paperwork to start June 17th - We will start a medication for blood pressure. - Please start checking your blood pressure once daily at home.  Keep a written log of the numbers and bring it to your follow-up appointment. - I have also prescribed a medication to help with weight loss.  It is a once weekly injection. Hopefully insurance approves this. - Return in 2-4 weeks for blood pressure and mood follow-up. - We are checking some labs. I will send you a MyChart message with the results or call if they are abnormal.   Take care and seek immediate care sooner if you develop any concerns.  Dr. [facility]

## 2022-08-21 NOTE — Telephone Encounter (Signed)
FMLA paperwork completed/updated and handed to patient during today's office visit.   Maury Dus, MD PGY-3, [facility]

## 2022-08-22 LAB — LIPID PANEL
Chol/HDL Ratio: 3.5 ratio (ref 0.0–4.4)
Cholesterol, Total: 225 mg/dL — ABNORMAL HIGH (ref 100–199)
HDL: 64 mg/dL (ref >39)
LDL Chol Calc (NIH): 145 mg/dL — ABNORMAL HIGH (ref 0–99)
Triglycerides: 90 mg/dL (ref 0–149)
VLDL Cholesterol Cal: 16 mg/dL (ref 5–40)

## 2022-08-22 LAB — HEMOGLOBIN A1C
Est. average glucose Bld gHb Est-mCnc: 123 mg/dL
Hgb A1c MFr Bld: 5.9 % — ABNORMAL HIGH (ref 4.8–5.6)

## 2022-08-22 LAB — BASIC METABOLIC PANEL
BUN/Creatinine Ratio: 9 (ref 9–23)
BUN: 7 mg/dL (ref 6–24)
CO2: 23 mmol/L (ref 20–29)
Calcium: 9.3 mg/dL (ref 8.7–10.2)
Chloride: 104 mmol/L (ref 96–106)
Creatinine, Ser: 0.78 mg/dL (ref 0.57–1.00)
Glucose: 85 mg/dL (ref 70–99)
Potassium: 3.9 mmol/L (ref 3.5–5.2)
Sodium: 143 mmol/L (ref 134–144)
eGFR: 91 mL/min/{1.73_m2} (ref >59)

## 2022-08-23 ENCOUNTER — Encounter: Payer: Self-pay | Admitting: Family Medicine

## 2022-08-23 DIAGNOSIS — R7303 Prediabetes: Secondary | ICD-10-CM | POA: Insufficient documentation

## 2022-09-04 ENCOUNTER — Ambulatory Visit: Payer: Self-pay | Admitting: Family Medicine

## 2022-09-07 ENCOUNTER — Telehealth: Payer: Self-pay

## 2022-09-07 NOTE — Telephone Encounter (Signed)
Patient calls nurse line in regards to FMLA forms.   She reports FMLA is supposed to start on Monday 6/17, however she reports she is feeling better and would like to continue normal working hours.  She reports her employer is asking for a letter stating the patient can resume work on 6/17.  Advised patient I will send a message to provider who saw patient.

## 2022-09-07 NOTE — Telephone Encounter (Signed)
Called patient and informed. She is appreciative.   Veronda Prude, RN

## 2022-09-07 NOTE — Telephone Encounter (Signed)
Letter written for her to continue working as requested.   Maury Dus, MD PGY-3, [facility]

## 2022-09-18 ENCOUNTER — Telehealth: Payer: Self-pay

## 2022-09-18 ENCOUNTER — Ambulatory Visit (INDEPENDENT_AMBULATORY_CARE_PROVIDER_SITE_OTHER): Payer: Managed Care, Other (non HMO) | Admitting: Student

## 2022-09-18 ENCOUNTER — Encounter: Payer: Self-pay | Admitting: Student

## 2022-09-18 ENCOUNTER — Other Ambulatory Visit: Payer: Self-pay

## 2022-09-18 VITALS — BP 128/81 | HR 76 | Ht 67.0 in | Wt 264.4 lb

## 2022-09-18 DIAGNOSIS — Z6841 Body Mass Index (BMI) 40.0 and over, adult: Secondary | ICD-10-CM

## 2022-09-18 DIAGNOSIS — Z0000 Encounter for general adult medical examination without abnormal findings: Secondary | ICD-10-CM

## 2022-09-18 DIAGNOSIS — Z1231 Encounter for screening mammogram for malignant neoplasm of breast: Secondary | ICD-10-CM | POA: Diagnosis not present

## 2022-09-18 DIAGNOSIS — Z1211 Encounter for screening for malignant neoplasm of colon: Secondary | ICD-10-CM | POA: Diagnosis not present

## 2022-09-18 DIAGNOSIS — E66813 Obesity, class 3: Secondary | ICD-10-CM

## 2022-09-18 MED ORDER — PHENTERMINE HCL 15 MG PO CAPS
15.0000 mg | ORAL_CAPSULE | ORAL | 0 refills | Status: DC
Start: 2022-09-18 — End: 2022-10-15

## 2022-09-18 NOTE — Patient Instructions (Addendum)
Let's try some phentermine. You are familiar with this. As before we will start with the lower dose for 1 month and see you back in four weeks.  I recommend the tracking app Cronometer. Protein is your friend, it will keep you full and has a positive "thermic effect of food."   I have ordered your mammogram. This will be done at the [facility], right across the street from our clinic. You will need to call to make this appointment. Their number is [phone].  The address is [address], [place], [place] [address]   Our GI docs will call you to schedule your colonoscopy, be expecting a call from a number you don't recognize.  Eliezer Mccoy, MD

## 2022-09-18 NOTE — Telephone Encounter (Signed)
Patient calls nurse line in regards to weight loss medications.   She reports she saw [facility] at the end of May, however was unaware she sent in [place]. She reports she would rather try Phentermine than an injectable at this time. She reports she has been on Phentermine in the past and had great success. She reports more energy and weight loss.   Patient advised she will need to make an apt to discuss Phentermine.   Patient scheduled for this afternoon with Fredric Mare.

## 2022-09-18 NOTE — Progress Notes (Unsigned)
    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. Three grandbabies.    Was seeing Dr. Malachi Carl in [place] and has lost 60lbs twice before with phentermine. HCG injections.  PERTINENT  PMH / PSH: ***  OBJECTIVE:   BP 128/81   Pulse 76   Ht 5\' 7"  (1.702 m)   Wt 264 lb 6.4 oz (119.9 kg)   LMP 08/07/2022   SpO2 100%   BMI 41.41 kg/m   ***  ASSESSMENT/PLAN:   No problem-specific Assessment & Plan notes found for this encounter.     Eliezer Mccoy, MD [facility]

## 2022-09-19 NOTE — Assessment & Plan Note (Signed)
Colonoscopy  and mammogram ordered. Scheduling discussed with patient.

## 2022-09-19 NOTE — Assessment & Plan Note (Addendum)
Unfortunately semaglutide is not covered by her insurance plan. Has had success with phentermine in the past and has lost significant weight with this in the past. Would like to try again.  - Reasonable to re-start phentermine 15mg  daily - F/u in 4 weeks - Introduced Programme researcher, broadcasting/film/video as a Therapist, sports. No specific targets at this time, will give her time to adapt to tracking and get a baseline of her nutrient intake  - Also discussed physical activity recommendations to augment her dietary and medication efforts

## 2022-10-12 ENCOUNTER — Ambulatory Visit: Payer: Self-pay

## 2022-10-15 ENCOUNTER — Encounter: Payer: Self-pay | Admitting: Student

## 2022-10-15 ENCOUNTER — Ambulatory Visit (INDEPENDENT_AMBULATORY_CARE_PROVIDER_SITE_OTHER): Payer: Managed Care, Other (non HMO) | Admitting: Student

## 2022-10-15 DIAGNOSIS — Z6841 Body Mass Index (BMI) 40.0 and over, adult: Secondary | ICD-10-CM | POA: Diagnosis not present

## 2022-10-15 MED ORDER — PHENTERMINE HCL 37.5 MG PO TABS
37.5000 mg | ORAL_TABLET | Freq: Every day | ORAL | 1 refills | Status: DC
Start: 2022-10-15 — End: 2023-07-17

## 2022-10-15 MED ORDER — TOPIRAMATE 25 MG PO CPSP
25.0000 mg | ORAL_CAPSULE | Freq: Every day | ORAL | 1 refills | Status: DC
Start: 2022-10-15 — End: 2022-11-09

## 2022-10-15 NOTE — Patient Instructions (Signed)
Christina Sparks,  It is excellent to see you!  I am so sorry to hear about the stress that you are under.  It does sound like it may be time for some major life changes in the interest of caring for yourself.  He can be incredibly difficult to lose weight when you are under a lot of stress and not sleeping well.  Perhaps a job change will help here.  In the meantime, we can "upgrade" your weight loss medication regimen to phentermine and topiramate.  These work synergistically to help promote weight loss.  Come back to see me in 4 to 8 weeks to check in.   J Dorothyann Gibbs, MD

## 2022-10-15 NOTE — Progress Notes (Addendum)
    SUBJECTIVE:   CHIEF COMPLAINT / HPI:   Weight Loss Efforts At her last visit we started phentermine 15 mg daily and also encouraged her to start tracking her intake. Unfortunately, her work schedule and stressors have precluded her really committing to tracking or making major changes in her eating patterns. She also endorses poor sleep related to work stress and acknowledges the deleterious effect this has on her weight loss efforts. Would be interested in transitioning to phentermine-topiramate in combination given potential synergistic effect.    OBJECTIVE:   BP 127/78 (BP Location: Left Arm, Patient Position: Sitting, Cuff Size: Large)   Pulse 83   Ht 5\' 7"  (1.702 m)   Wt 258 lb 12.8 oz (117.4 kg)   SpO2 100%   BMI 40.53 kg/m   General: alert & oriented, no apparent distress, well groomed HEENT: normocephalic, atraumatic, EOM grossly intact, oral mucosa moist, neck supple Respiratory: normal respiratory effort GI: non-distended Skin: no rashes, no jaundice Psych: appropriate mood and affect  ASSESSMENT/PLAN:   Class 3 severe obesity with body mass index (BMI) of 40.0 to 44.9 in adult (HCC) Weight is down 1 lb! Unfortunately I fear that she will have a very challenging time losing weight under the amount of stress she is with her current work arrangement. She is actively looking for new jobs. I expect a new job and an auspicious beginning would help her turn the corner with weight loss efforts as well. In the meantime, will transition her to phentermine-topiramate in combination. BP tolerated the initiation of phentermine without issue.  - Phentermine 37.5mg  Daily - Topiramate 25mg  daily      J Dorothyann Gibbs, MD [facility]

## 2022-10-16 ENCOUNTER — Telehealth: Payer: Self-pay | Admitting: Family Medicine

## 2022-10-16 NOTE — Assessment & Plan Note (Signed)
Weight is down 1 lb! Unfortunately I fear that she will have a very challenging time losing weight under the amount of stress she is with her current work arrangement. She is actively looking for new jobs. I expect a new job and an auspicious beginning would help her turn the corner with weight loss efforts as well. In the meantime, will transition her to phentermine-topiramate in combination. BP tolerated the initiation of phentermine without issue.  - Phentermine 37.5mg  Daily - Topiramate 25mg  daily

## 2022-10-16 NOTE — Telephone Encounter (Signed)
Patient dropped off FMLA paperwork to be completed. Last DOS was 10/15/22. Placed in [employer].

## 2022-10-17 NOTE — Telephone Encounter (Signed)
Form has been placed in your box to be completed, please finish when you have time. Thank you! Dayshia Ottley CMA  

## 2022-10-18 NOTE — Telephone Encounter (Signed)
FO staff came to get form.   Asked for a copy to be made for batch scanning.   Form given to patient.

## 2022-10-25 NOTE — Progress Notes (Signed)
    SUBJECTIVE:   CHIEF COMPLAINT / HPI:   Depression FMLA completed last week to write pt out of work for 3 weeks for depression attributed to workplace stress and burnout. Today, pt states sitting at home is not doing her any good and she prefers to go back to work, is requesting letter to do so. Does still attribute symptoms to her job because when she is at home she is thinking about what she will have to go back to.  PHQ-9 score of 15 with 0 for #9. Denies SI.  Has been experiencing depression and anxiety for past few months but worsening and impacting her ability to enjoy her life.   Goes to her pastor for counseling which she feels is helpful. Also has friends and family members she can talk to.  Would like to start medication.    PERTINENT  PMH / PSH: Depression and anxiety  OBJECTIVE:   BP 120/80   Pulse (!) 58   Wt 248 lb (112.5 kg)   LMP 09/22/2022   SpO2 98%   BMI 38.84 kg/m    General: NAD, pleasant, able to participate in exam Cardiac: Well-perfused Respiratory: Breathing comfortably on room air Skin: warm and dry Neuro: alert, no obvious focal deficits Psych: Normal affect and mood  ASSESSMENT/PLAN:   Anxiety and depression Declined list of therapist in the area, stating that she is seeing her pastor.  She is safe to self.  Discussed manic symptoms, patient denies. -Provided patient with work note as she may return to work as early as Monday, 8/5.  -Prescribed Prozac 20 mg daily. -Patient to return in 4 weeks for follow-up     Dr. Erick Alley, DO [facility]

## 2022-10-26 ENCOUNTER — Ambulatory Visit (INDEPENDENT_AMBULATORY_CARE_PROVIDER_SITE_OTHER): Payer: Managed Care, Other (non HMO) | Admitting: Student

## 2022-10-26 VITALS — BP 120/80 | HR 58 | Wt 248.0 lb

## 2022-10-26 DIAGNOSIS — F419 Anxiety disorder, unspecified: Secondary | ICD-10-CM

## 2022-10-26 DIAGNOSIS — F32A Depression, unspecified: Secondary | ICD-10-CM | POA: Diagnosis not present

## 2022-10-26 MED ORDER — FLUOXETINE HCL 20 MG PO TABS
20.0000 mg | ORAL_TABLET | Freq: Every day | ORAL | 3 refills | Status: DC
Start: 2022-10-26 — End: 2022-11-09

## 2022-10-26 NOTE — Patient Instructions (Signed)
It was great to see you! Thank you for allowing me to participate in your care!  I recommend that you always bring your medications to each appointment as this makes it easy to ensure you are on the correct medications and helps [place] not miss when refills are needed.  Our plans for today:  - I sent in prescription of Prozac to your pharmacy.  - Return in 4 weeks for follow up visit with your PCP   Take care and seek immediate care sooner if you develop any concerns.   Dr. Erick Alley, DO [facility]

## 2022-10-26 NOTE — Assessment & Plan Note (Addendum)
Declined list of therapist in the area, stating that she is seeing her pastor.  She is safe to self.  Discussed manic symptoms, patient denies. -Provided patient with work note as she may return to work as early as Monday, 8/5.  -Prescribed Prozac 20 mg daily. -Patient to return in 4 weeks for follow-up

## 2022-11-08 ENCOUNTER — Other Ambulatory Visit: Payer: Self-pay | Admitting: Student

## 2022-11-09 ENCOUNTER — Other Ambulatory Visit: Payer: Self-pay | Admitting: Student

## 2022-11-09 DIAGNOSIS — F32A Depression, unspecified: Secondary | ICD-10-CM

## 2023-01-14 ENCOUNTER — Telehealth: Payer: Self-pay

## 2023-01-14 ENCOUNTER — Ambulatory Visit: Payer: Managed Care, Other (non HMO)

## 2023-01-14 VITALS — BP 136/78 | HR 88 | Ht 67.0 in | Wt 251.2 lb

## 2023-01-14 DIAGNOSIS — R053 Chronic cough: Secondary | ICD-10-CM

## 2023-01-14 MED ORDER — IPRATROPIUM BROMIDE 0.02 % IN SOLN
0.5000 mg | Freq: Once | RESPIRATORY_TRACT | Status: AC
Start: 2023-01-14 — End: 2023-01-14
  Administered 2023-01-14: 0.5 mg via RESPIRATORY_TRACT

## 2023-01-14 MED ORDER — ALBUTEROL SULFATE (2.5 MG/3ML) 0.083% IN NEBU
2.5000 mg | INHALATION_SOLUTION | Freq: Four times a day (QID) | RESPIRATORY_TRACT | 1 refills | Status: DC | PRN
Start: 2023-01-14 — End: 2023-03-01

## 2023-01-14 MED ORDER — TRELEGY ELLIPTA 100-62.5-25 MCG/ACT IN AEPB
1.0000 | INHALATION_SPRAY | Freq: Every day | RESPIRATORY_TRACT | 1 refills | Status: DC
Start: 2023-01-14 — End: 2023-01-14

## 2023-01-14 MED ORDER — ALBUTEROL SULFATE (2.5 MG/3ML) 0.083% IN NEBU
2.5000 mg | INHALATION_SOLUTION | Freq: Once | RESPIRATORY_TRACT | Status: AC
Start: 2023-01-14 — End: 2023-01-14
  Administered 2023-01-14: 2.5 mg via RESPIRATORY_TRACT

## 2023-01-14 MED ORDER — PREDNISONE 20 MG PO TABS
20.0000 mg | ORAL_TABLET | Freq: Every day | ORAL | 0 refills | Status: DC
Start: 2023-01-14 — End: 2023-02-26

## 2023-01-14 NOTE — Patient Instructions (Addendum)
[name],  It was lovely seeing you in clinic today! You came in with a cough, wheezing, and chest tightness. We gave you a breathing treatment in clinic. We are also sending you home with a nebulizer and a prescription for albuterol nebulizer solution.   There are a few things for you to do after today's visit: It does not look like Trelegy will be covered by your insurance and it is *possible* that the inhaled steroid component could make things worse.   We are prescribing a prednisone course of 40 mg daily for 5 days. Please follow-up with Dr. Thurston Hole office, your pulmonologist. Please call them at [phone] and ask for an appointment  You are due for several health maintenance items, please make an appointment with your PCP Dr. Ardyth Harps on your way out the door.   Thank you for allowing [place] to be a part of your care team! Governor Rooks, medical student

## 2023-01-14 NOTE — Telephone Encounter (Signed)
Dr. Marisue Humble requested that patient receive nebulizer at today's visit. Patient completed paperwork for nebulizer from [employer].   Paperwork faxed to [employer].   Originals placed at my desk.   Veronda Prude, RN

## 2023-01-14 NOTE — Progress Notes (Unsigned)
    SUBJECTIVE:   CHIEF COMPLAINT / HPI:   Christina Sparks is a 52 y.o. female who presents today for cough.  Reports over a year ago she got COVID and her lungs "haven't been same since." She has intermittent episodes of cough and chest tightness which usually occur around the season changing in the spring and fall. Last episode was 3-4 months ago and improved with breathing treatment and a course of prednisone.  She recently began feeling poorly again 2-3 weeks ago with cough productive with clear and yellow mucus, chest tightness, and wheezing which is especially bad with lying down. She tried using a Trelegy inhaler at the start of her symptoms which did improve them; however, she then ran out of this and switched to an albuterol inhaler, which did not have much improvement. She has been using her albuterol inhaler every 1-2 hours. No fevers, chills. No reflux. No sore throat.  Has been seen by [facility], last visit 01/2022. Got pulmonary function tests at that time which were read as normal, but also reported her lung age to be in the 95s. On review of spirometry, her FEV and FEV1 were both ~80% of predicted. No bronchodilator challenge performed. Per review of Dr. 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   No hx tobacco use.  PERTINENT  PMH / PSH: HTN, migraine, anemia, reflux  OBJECTIVE:   BP 136/78   Pulse 88   Ht 5\' 7"  (1.702 m)   Wt 251 lb 3.2 oz (113.9 kg)   SpO2 100%   BMI 39.34 kg/m   General: Pt is seated on chair, no acute distress. Cardiovascular: RRR, no murmurs, rubs, gallops. Pulmonary: Normal work of breathing. Frequent cough. Coarse lung sounds diffusely with loud expiratory wheeze. Did seem to clear up somewhat after duoneb treatment in office.  Neuro/Psych: Alert and oriented to person, place, event, time. Normal affect.  ASSESSMENT/PLAN:   Acute on Chronic cough Pt with coarse lung sounds and  expiratory wheeze. She had spirometry in 01/2022 which was grossly normal, though lung age in the 72s. CT done in 2022 showed possible acute or chronic bronchitic change. Differential includes cough-variant asthma given prior pt-reported improvement with inhaler and steroids. Can also consider laryngospasm given significant expiratory wheeze. Allergy component also likely given prior findings with pulmonologist in 01/2022 and excellent response to steroids. Will prescribe an albuterol nebulizer solution given pt's improved symptoms with in-office nebulizer breathing treatment; also prescribing a prednisone course to see if this improves symptoms. Also recommend pt follow back with pulmonology for further workup and management.  - START albuterol nebulizer solution - START prednisone 40 mg daily for 5 days - Follow-up with Dr. Sherene Sires, pulmonology - Considered re-trialing Trelegy, but conflicting reports: the patient tells me that this was helpful, but per Dr. Thurston Hole note, it did not help with the episode in 2023. Unfortunately it does not appear this would be covered by her insurance at this time and we do not currently have a supply of samples in office. Will defer to Dr. Sherene Sires here.    Governor Rooks, Medical Student [facility]    I have evaluated this patient along with medical student Governor Rooks and reviewed the above note, making necessary revisions.  Dorothyann Gibbs, MD 01/15/2023, 2:03 PM PGY-3, [facility]

## 2023-01-14 NOTE — Assessment & Plan Note (Signed)
Pt with coarse lung sounds and expiratory wheeze. She has a history of normal spirometry in 01/2022, though lung age was reported to be in the 59s. CT done in 2022 showed possible acute or chronic bronchitic change. Differential includes cough-variant asthma given prior pt-reported improvement with inhaler and steroids. Can also consider laryngospasm given significant expiratory wheeze. Allergy component also likely given prior findings with pulmonologist in 01/2022. Will prescribe an albuterol nebulizer solution given pt's improved symptoms with in-office nebulizer breathing treatment; also prescribing a prednisone course to see if this improves symptoms. Also recommend pt follow back with pulmonology for further workup and management. - START albuterol nebulizer solution - START prednisone 40 mg daily for 5 days - Follow-up with Dr. Sherene Sires in pulmonology

## 2023-01-23 NOTE — Progress Notes (Deleted)
Christina Sparks, female    DOB: 1970-07-04   MRN: [id number]   Brief patient profile:  64 yobf  never smoker referred to pulmonary clinic 01/25/2022 by Terisa Starr for refractory cough  With a pattern of cough that lasts months to a year starting  in her 32s ? Responsive to [facility] well for up to a year and then worse "with the cold weather" but  since  the year of covid 19 came and stayed though never tested pos for covid and no response to trelegy   Baseline wt 215 in her 40s  peak was 278   History of Present Illness  01/25/2022  Pulmonary/ 1st office eval/Christina Sparks  Chief Complaint  Patient presents with   Consult  Dyspnea:  not aerobically active but loses breath to speak/not walk   Cough: yellowish better at bedtime as long as rotates off the L side / sma SABA use: not helpful, makes her cough  L cp with cough rad front to back recurrent x years  same pattern with neg ct x 2, one of which CTa  06/14/19 Rec Mucinex dm 1200 mg twice daily and supplement with tramadol 50 mg  1 every 4 hours as needed Prednisone 10 mg take  4 each am x 2 days,   2 each am x 2 days,  1 each am x 2 days and stop  Pantoprazole (protonix) 40 mg   Take  30-60 min before first meal of the day and Pepcid (famotidine)  20 mg after supper until return to office - GERD diet reviewed, bed blocks rec   lab and x-ray department  for your tests - we will call you with the results when they are available. Please schedule a follow up office visit in 2  weeks, sooner if needed    Allergy screen 01/25/2022 >  Eos 0. 3/  IgE  1502   02/08/2022  f/u ov/Christina Sparks re: cough    maint on no rx   Chief Complaint  Patient presents with   Follow-up    Feeling better 85%. Cough-yellow, not as hoarse, slight sob  Dyspnea:  loses breath to speak, not walk  Cough: bad cough each am slt yellowish mucus  Sleeping: flat bed ok for a few hours then wakes up cough w/in a few hours q noct assoc with thorat tickle  SABA use: none  02: none   Rec For cough tessalon pearls every 6 hours as needed  Prednisone 10 mg take  4 each am x 2 days,   2 each am x 2 days,  1 each am x 2 days and stop  Pantoprazole (protonix) 40 mg   Take  30-60 min before first meal of the day and Pepcid (famotidine)  20 mg after supper  for a full  3 months  GERD diet reviewed, bed blocks rec  For drainage / throat tickle try take CHLORPHENIRAMINE  4 mg  My office will be contacting you by phone for referral to allergy testing       01/24/2023  f/u ov/Christina Sparks re: cough    maint on ***  No chief complaint on file.   Dyspnea:  *** Cough: *** Sleeping: *** resp cc  SABA use: *** 02: ***  Lung cancer screening :  ***    No obvious day to day or daytime variability or assoc excess/ purulent sputum or mucus plugs or hemoptysis or cp or chest tightness, subjective wheeze or overt sinus or hb symptoms.  Also denies any obvious fluctuation of symptoms with weather or environmental changes or other aggravating or alleviating factors except as outlined above   No unusual exposure hx or h/o childhood pna/ asthma or knowledge of premature birth.  Current Allergies, Complete Past Medical History, Past Surgical History, Family History, and Social History were reviewed in [employer] record.  ROS  The following are not active complaints unless bolded Hoarseness, sore throat, dysphagia, dental problems, itching, sneezing,  nasal congestion or discharge of excess mucus or purulent secretions, ear ache,   fever, chills, sweats, unintended wt loss or wt gain, classically pleuritic or exertional cp,  orthopnea pnd or arm/hand swelling  or leg swelling, presyncope, palpitations, abdominal pain, anorexia, nausea, vomiting, diarrhea  or change in bowel habits or change in bladder habits, change in stools or change in urine, dysuria, hematuria,  rash, arthralgias, visual complaints, headache, numbness, weakness or ataxia or problems with walking or  coordination,  change in mood or  memory.        No outpatient medications have been marked as taking for the 01/24/23 encounter (Appointment) with Nyoka Cowden, MD.             Past Medical History:  Diagnosis Date   Anemia    Back pain    Knee pain    Migraines    S/P bilateral breast reduction    S/P tubal ligation         Objective:    wts   01/24/2023     ***  02/08/22 250 lb 6.4 oz (113.6 kg)  01/18/22 245 lb 12.8 oz (111.5 kg)  01/04/22 245 lb (111.1 kg)    Vital signs reviewed  01/24/2023  - Note at rest 02 sats  ***% on ***   General appearance:    ***   .          Assessment

## 2023-01-24 ENCOUNTER — Other Ambulatory Visit: Payer: Self-pay | Admitting: Student

## 2023-01-24 ENCOUNTER — Ambulatory Visit: Payer: Managed Care, Other (non HMO) | Admitting: Internal Medicine

## 2023-01-25 ENCOUNTER — Encounter: Payer: Self-pay | Admitting: Internal Medicine

## 2023-02-11 IMAGING — CT CT CHEST W/O CM
2 of 4 series · 13 of 36 positions shown, 16 images · non-contrast
Comparison: Radiograph 08/11/2020, 04/23/2020, CT 06/14/2019

CLINICAL DATA: Chest tightness and cough for several months, now
with left-sided pain

EXAM:
CT CHEST WITHOUT CONTRAST
TECHNIQUE: Multidetector CT imaging of the chest was performed following the
standard protocol without IV contrast.

[Series 3: chest wo · axial · 0.76mm/px · z∈[+190,+400]mm · 10 of 125 slices shown, 13 images]
[im 10/125  mediastinal]
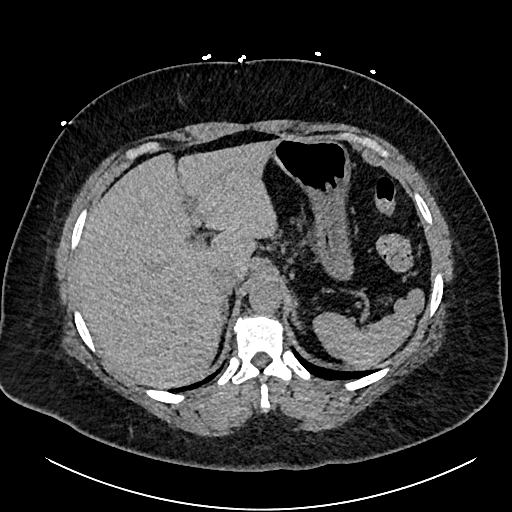
[im 10/125  lung]
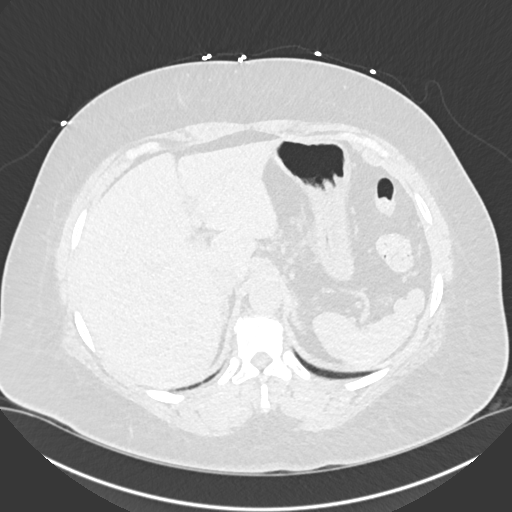
[im 20/125  lung]
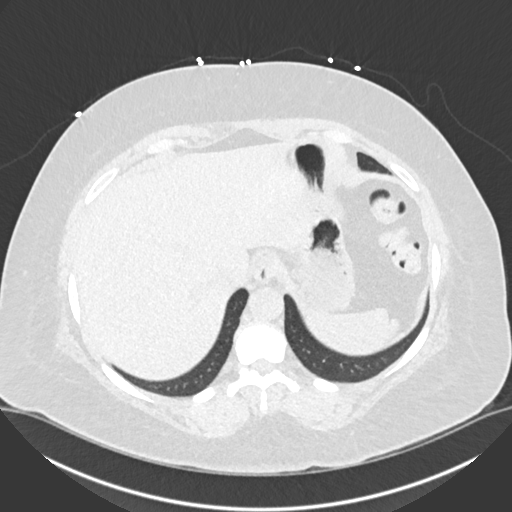
[im 39/125  lung]
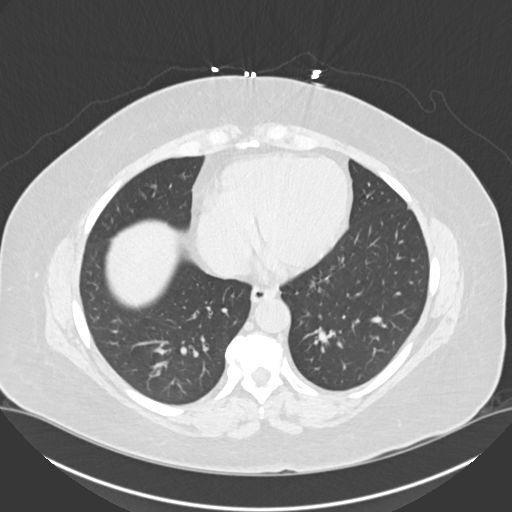
[im 48/125  lung]
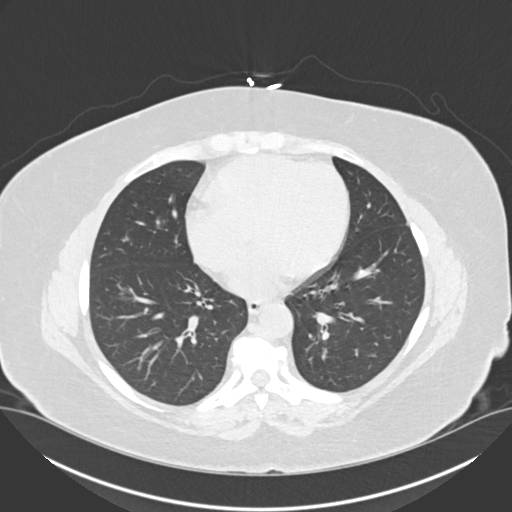
[im 58/125  mediastinal]
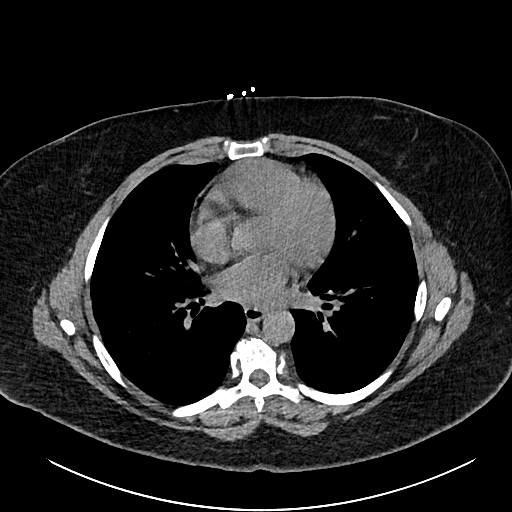
[im 58/125  lung]
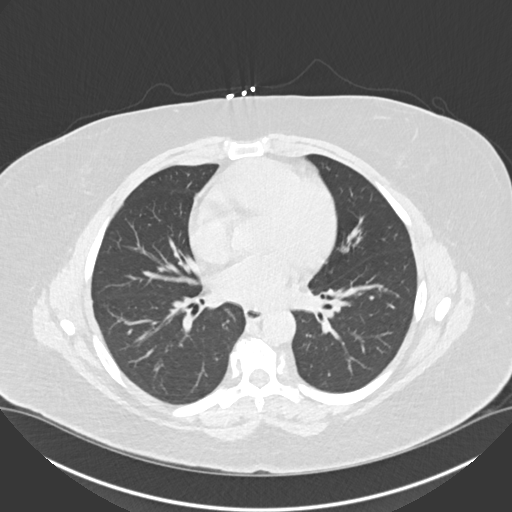
[im 67/125  lung]
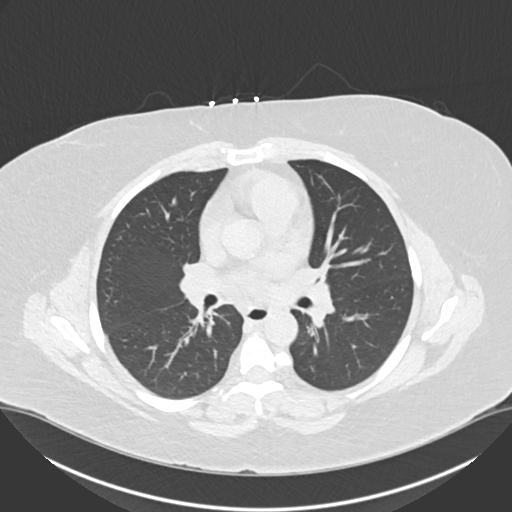
[im 77/125  lung]
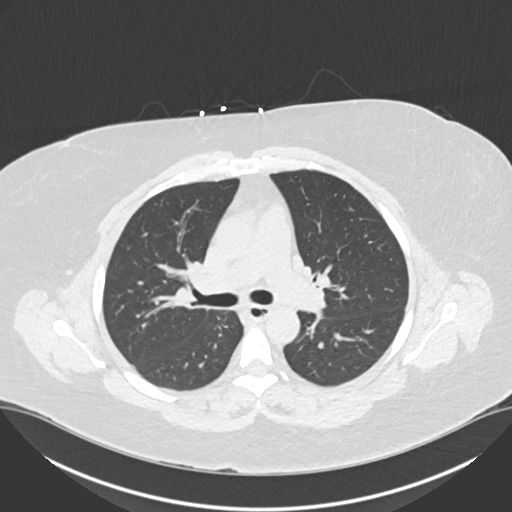
[im 96/125  lung]
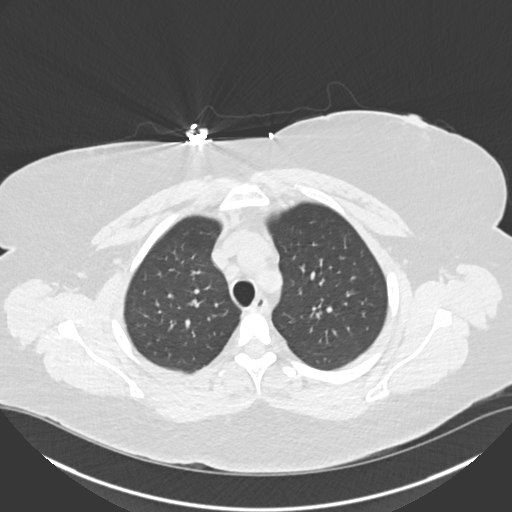
[im 105/125  mediastinal]
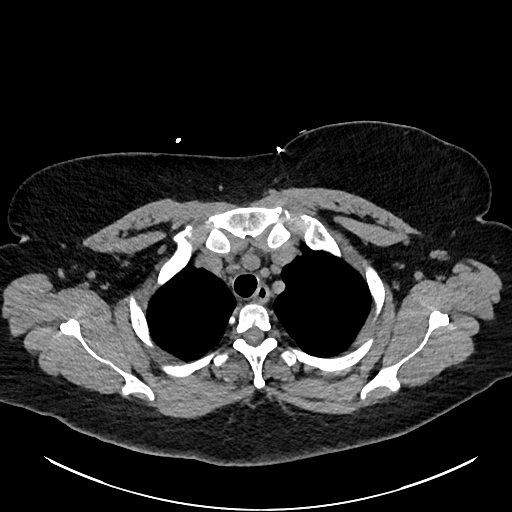
[im 105/125  lung]
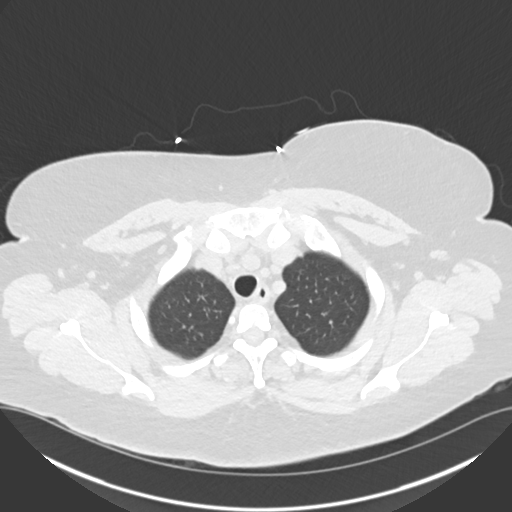
[im 115/125  lung]
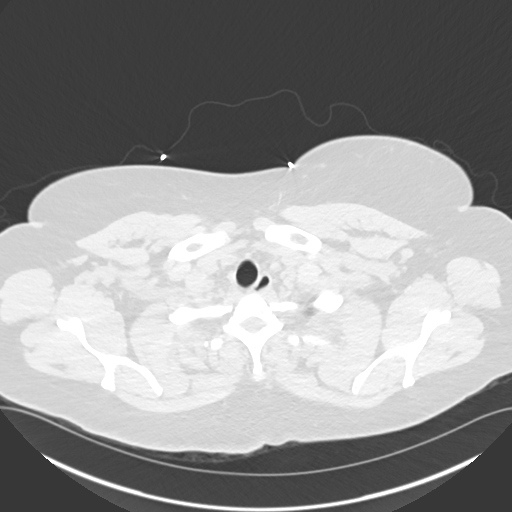

[Series 5: cor · coronal · 0.49mm/px · 3 of 162 slices shown]
[im 33/162  lung]
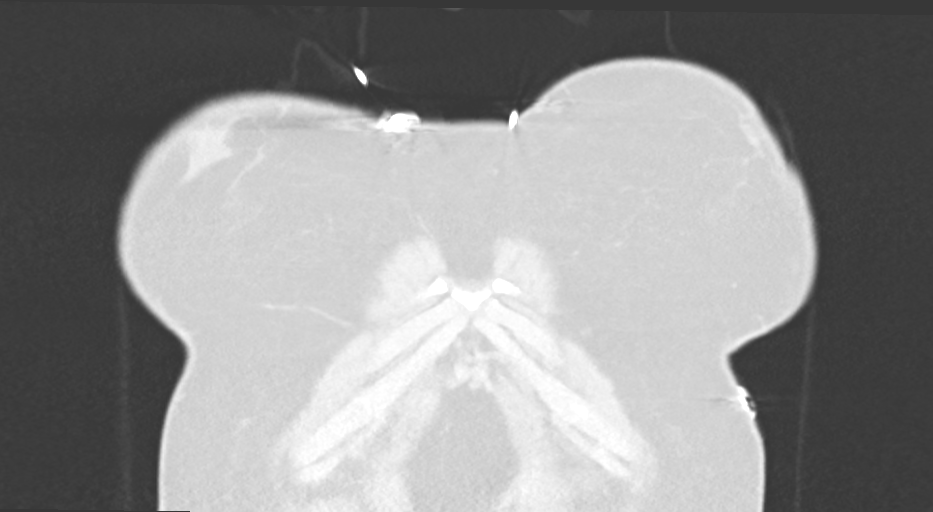
[im 65/162  lung]
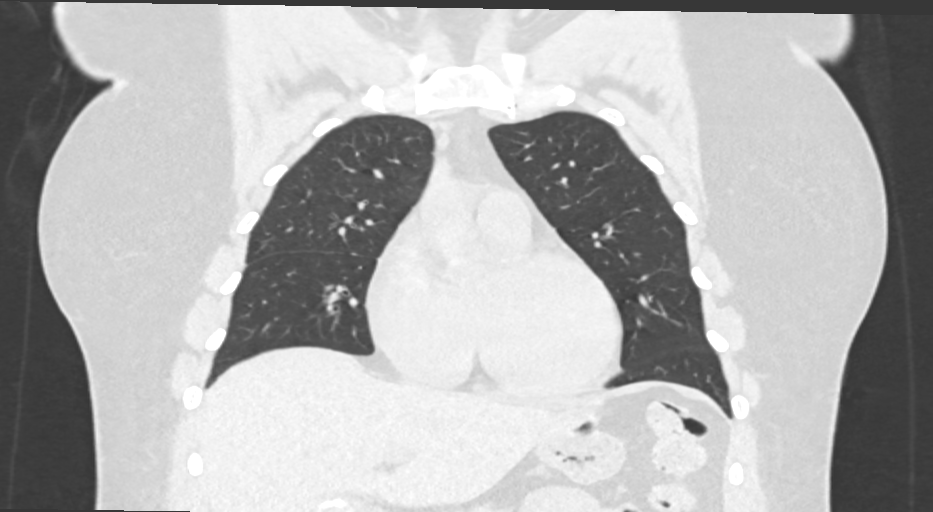
[im 97/162  lung]
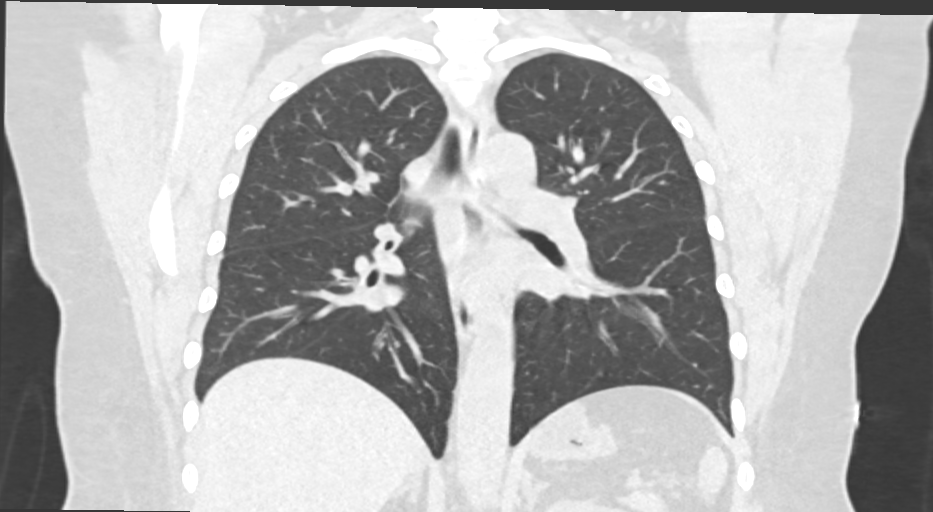

[13 of 36 positions shown; findings below may reference images not displayed]

FINDINGS: Cardiovascular: Cardiac size within normal limits. No sizable
pericardial effusion. Trace fluid in the pericardial recesses is
likely within physiologic normal. The aortic root is suboptimally
assessed given cardiac pulsation artifact. The aorta is normal
caliber. Normal 3 vessel branching of the aortic arch. Central
pulmonary arteries are normal caliber. No major venous
abnormalities. Luminal assessment of the vasculature is precluded in
the absence of contrast media.

Mediastinum/Nodes: No mediastinal fluid or gas. Normal thyroid gland
and thoracic inlet. No acute abnormality of the trachea or
esophagus. Few scattered subcentimeter lymph nodes in the
mediastinum are not significantly changed from prior. No worrisome
enlarged or enlarging mediastinal or axillary adenopathy. Hilar
nodal evaluation is limited in the absence of intravenous contrast
media.

Lungs/Pleura: There is diffuse mild airways thickening and some
scattered secretions which could reflect an acute or chronic
bronchitic change. Resolution of previously seen patchy opacities in
the lungs. No new consolidative process is seen. No convincing CT
features of edema. No pneumothorax or effusion. No worrisome
pulmonary nodules or masses. Mosaic attenuation present on the
comparison prior is less apparent on this exam likely reflect
changes related to imaging during exhalation on the prior study.

Upper Abdomen: Calcified gallstone within the gallbladder partially
visualized on this exam. No visible adjacent inflammation within the
margins of imaging. No other acute or conspicuous upper abdominal
abnormality.

Musculoskeletal: Multilevel degenerative changes are present in the
imaged portions of the spine. No acute osseous abnormality or
suspicious osseous lesion. No worrisome chest wall masses or lesions
IMPRESSION: Some mild airways thickening and scattered secretions could reflect
acute or chronic bronchitic change particularly in the given
clinical setting. Resolution of previously seen patchy subpleural
opacities comparison exam. No new acute consolidative process is
seen.

No other acute or significant cardiopulmonary abnormalities.

## 2023-02-13 ENCOUNTER — Other Ambulatory Visit: Payer: Self-pay | Admitting: Student

## 2023-02-15 ENCOUNTER — Other Ambulatory Visit: Payer: Self-pay | Admitting: Student

## 2023-02-20 ENCOUNTER — Ambulatory Visit: Payer: Managed Care, Other (non HMO) | Admitting: Internal Medicine

## 2023-02-25 ENCOUNTER — Telehealth: Payer: Self-pay | Admitting: Internal Medicine

## 2023-02-25 NOTE — Telephone Encounter (Signed)
Pt sent in a appt request for Chronic cough & tightness in chest, wheezing severely. Return illness, her pcp has prescribed her albuterol sulfate, it has been interrupting her sleep  [facility] on [address]

## 2023-02-26 ENCOUNTER — Encounter: Payer: Self-pay | Admitting: Student

## 2023-02-26 ENCOUNTER — Telehealth: Payer: Self-pay

## 2023-02-26 ENCOUNTER — Ambulatory Visit: Payer: Managed Care, Other (non HMO) | Admitting: Student

## 2023-02-26 ENCOUNTER — Other Ambulatory Visit: Payer: Self-pay | Admitting: Student

## 2023-02-26 VITALS — BP 126/83 | HR 93 | Ht 67.0 in | Wt 253.2 lb

## 2023-02-26 DIAGNOSIS — R053 Chronic cough: Secondary | ICD-10-CM | POA: Diagnosis not present

## 2023-02-26 MED ORDER — FUROSEMIDE 20 MG PO TABS
20.0000 mg | ORAL_TABLET | Freq: Every day | ORAL | 0 refills | Status: DC
Start: 2023-02-26 — End: 2023-08-07

## 2023-02-26 MED ORDER — DOXYCYCLINE HYCLATE 100 MG PO TABS
100.0000 mg | ORAL_TABLET | Freq: Two times a day (BID) | ORAL | 0 refills | Status: AC
Start: 2023-02-26 — End: 2023-03-03

## 2023-02-26 MED ORDER — PREDNISONE 10 MG PO TABS
ORAL_TABLET | ORAL | 0 refills | Status: AC
Start: 2023-02-26 — End: 2023-03-10

## 2023-02-26 MED ORDER — BREZTRI AEROSPHERE 160-9-4.8 MCG/ACT IN AERO
2.0000 | INHALATION_SPRAY | Freq: Two times a day (BID) | RESPIRATORY_TRACT | Status: DC
Start: 2023-02-26 — End: 2023-07-17

## 2023-02-26 MED ORDER — FEXOFENADINE HCL 60 MG PO TABS
60.0000 mg | ORAL_TABLET | Freq: Two times a day (BID) | ORAL | 0 refills | Status: DC
Start: 2023-02-26 — End: 2023-02-27

## 2023-02-26 MED ORDER — BREZTRI AEROSPHERE 160-9-4.8 MCG/ACT IN AERO
2.0000 | INHALATION_SPRAY | Freq: Two times a day (BID) | RESPIRATORY_TRACT | Status: DC
Start: 2023-02-26 — End: 2023-08-07

## 2023-02-26 NOTE — Telephone Encounter (Signed)
Patient checking on message for wheezing. Patient phone number is [phone].

## 2023-02-26 NOTE — Patient Instructions (Addendum)
It was great to see you! Thank you for allowing me to participate in your care!   Our plans for today:  - Getting a chest xray - predniSONE taper for 12 days - doxycycline (VIBRA-TABS) 100 MG tablet; Take 1 tablet (100 mg total) by mouth 2 (two) times daily for 5 days - ECHOCARDIOGRAM COMPLETE ordered - fexofenadine (ALLEGRA) 60 MG tablet - lasix 20 for 5 days - follow up with allergy specialist - return to care if fevers, worsening shortness of breath   Take care and seek immediate care sooner if you develop any concerns.  Levin Erp, MD

## 2023-02-26 NOTE — Telephone Encounter (Signed)
Patient calls nurse line requesting to schedule same day appointment for issues with cough, headache and chest tightness. She also reports some lightheadedness.   She was seen on 01/14/23 for the same concern and was supposed to follow up with pulmonology. She is unable to schedule with them until February. She states that she is traveling back today from out of town and was having episodes of severe coughing. She pulled over at rest stop and administered albuterol breathing treatment. She reports some improvement. She is speaking in complete sentences at time of conversation.   Scheduled same day appointment for this afternoon at 4:10. Discussed ED precautions.   Veronda Prude, RN

## 2023-02-26 NOTE — Progress Notes (Signed)
SUBJECTIVE:   CHIEF COMPLAINT / HPI: Acute on chronic cough  Nurse Line Call: "Patient calls nurse line requesting to schedule same day appointment for issues with cough, headache and chest tightness. She also reports some lightheadedness.  She was seen on 01/14/23 for the same concern and was supposed to follow up with pulmonology. She is unable to schedule with them until February. She states that she is traveling back today from out of town and was having episodes of severe coughing. She pulled over at rest stop and administered albuterol breathing treatment. She reports some improvement. She is speaking in complete sentences at time of conversation."  Today:  States her chronic cough presents with worsening symptoms. They describe the cough as severe, associated with wheezing and difficulty sleeping. The cough is productive, with her bringing up 'wads of brownish mucus' that later turns yellow. No bright red blood. No fevers. No other people around her sick. She also reports shortness of breath, to the point where walking short distances feels like running a marathon. She has been using albuterol inhalers, which provides slight relief but also states that it actually triggers the cough. She has also been experiencing lower extremity swelling this week, difficulty lying flat due to a sensation of pressure on the chest, and dyspnea on exertion. States she has never smoked.   She states trelegy has been helpful in the past however she could not afford to stay on it. She also states the steroids prescribed in October were helpful but too short and symptoms rebounded shortly after discontinuing.  Synopsis from pulmonology: Onset in her 40s lasting months at a time then up to a year symptoms or need for meds/ worse since 2020 covid (neg testing) - cough worse on use of saba/trelegy  01/25/2022  - FENO 01/25/2022 = 14 - Spirometry 01/25/2022  normal off trelegy x 3 days  - Allergy screen 01/25/2022  >  Eos 0. 3/  IgE  1502 > referred to allergy 02/08/2022  - cyclical cough rx 01/25/2022 and off inhalers > did not follow instructions - 02/08/2022 repeat cough protocol but use tessalon instead of tramadol and added  1st gen H1 blockers per guidelines at hs for noct cough component   PERTINENT  PMH / PSH: chronic cough  OBJECTIVE:   BP 126/83   Pulse 93   Ht 5\' 7"  (1.702 m)   Wt 253 lb 3.2 oz (114.9 kg)   LMP 02/20/2023   SpO2 100%   BMI 39.66 kg/m   General: NAD, awake, alert, responsive to questions Head: Normocephalic atraumatic CV: Regular rate and rhythm no murmurs rubs or gallops Respiratory: Diffuse wheezing throughout long fields, chest rises symmetrically,  speaking in full sentences although short Extremities: Moves upper and lower extremities freely, 2+ pitting edema in BLE Neuro: No focal deficits Skin: No rashes or lesions visualized  ASSESSMENT/PLAN:   Assessment & Plan Chronic cough Acute exacerbation x 1 month. 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 She is not taking any H1 blockers currently. Differential large-could be infectious (PNA) given productive cough. New onset heart failure possible as well given new swelling, DOE and orthopnea. GERD possible as well although large amount of wheezing on exam. Will treat broadly given severity and length on symptoms. - DG Chest 2 View - predniSONE taper for 12 days (felt previous steroid helped but was too short per patient and symptoms rebounded) - doxycycline (VIBRA-TABS) 100 MG  BID for 5  days - ECHOCARDIOGRAM COMPLETE - fexofenadine (ALLEGRA) 60 MG tablet BID - furosemide (LASIX) 20 MG tablet x 5 days  - Ambulatory referral to Allergy - Budeson-Glycopyrrol-Formoterol (BREZTRI AEROSPHERE) 160-9-4.8 MCG/ACT AERO; Inhale 2 puffs into the lungs in the morning and at bedtime. -- 2 samples provided in office today - 1 week follow up - consider GERD treatment or BNP if not  improved (unable to obtain labs today d/t time) - ED/return precautions discussed   Levin Erp, MD [facility]

## 2023-02-26 NOTE — Telephone Encounter (Signed)
Patient last seen 2023. Spoke to patient. She is aware that an appt is needed prior to tx. She stated that she is currently being seen at PCP for sx.  She will keep scheduled appt for 04/22/23. Nothing further needed.

## 2023-02-26 NOTE — Assessment & Plan Note (Signed)
Acute exacerbation x 1 month. 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 She is not taking any H1 blockers currently. Differential large-could be infectious (PNA) given productive cough. New onset heart failure possible as well given new swelling, DOE and orthopnea. GERD possible as well although large amount of wheezing on exam. Will treat broadly given severity and length on symptoms. - DG Chest 2 View - predniSONE taper for 12 days (felt previous steroid helped but was too short per patient and symptoms rebounded) - doxycycline (VIBRA-TABS) 100 MG  BID for 5 days - ECHOCARDIOGRAM COMPLETE - fexofenadine (ALLEGRA) 60 MG tablet BID - furosemide (LASIX) 20 MG tablet x 5 days  - Ambulatory referral to Allergy - Budeson-Glycopyrrol-Formoterol (BREZTRI AEROSPHERE) 160-9-4.8 MCG/ACT AERO; Inhale 2 puffs into the lungs in the morning and at bedtime. -- 2 samples provided in office today - 1 week follow up - consider GERD treatment or BNP if not improved (unable to obtain labs today d/t time) - ED/return precautions discussed

## 2023-03-01 ENCOUNTER — Other Ambulatory Visit: Payer: Self-pay

## 2023-03-01 DIAGNOSIS — R053 Chronic cough: Secondary | ICD-10-CM

## 2023-03-01 MED ORDER — ALBUTEROL SULFATE (2.5 MG/3ML) 0.083% IN NEBU: 2.5000 mg | INHALATION_SOLUTION | Freq: Four times a day (QID) | RESPIRATORY_TRACT | 1 refills | Status: AC | PRN

## 2023-03-16 ENCOUNTER — Other Ambulatory Visit: Payer: Self-pay | Admitting: Student

## 2023-03-16 DIAGNOSIS — R053 Chronic cough: Secondary | ICD-10-CM

## 2023-03-18 ENCOUNTER — Ambulatory Visit (HOSPITAL_COMMUNITY): Admission: RE | Admit: 2023-03-18 | Payer: Managed Care, Other (non HMO) | Source: Ambulatory Visit

## 2023-04-22 ENCOUNTER — Encounter: Payer: Self-pay | Admitting: Nurse Practitioner

## 2023-04-22 ENCOUNTER — Ambulatory Visit: Payer: Managed Care, Other (non HMO) | Admitting: Nurse Practitioner

## 2023-05-13 ENCOUNTER — Other Ambulatory Visit: Payer: Self-pay

## 2023-05-14 ENCOUNTER — Ambulatory Visit: Payer: Managed Care, Other (non HMO) | Admitting: Internal Medicine

## 2023-06-09 ENCOUNTER — Other Ambulatory Visit: Payer: Self-pay | Admitting: Student

## 2023-07-17 ENCOUNTER — Ambulatory Visit: Admitting: Student

## 2023-07-17 VITALS — BP 133/74 | HR 79 | Ht 67.0 in | Wt 261.6 lb

## 2023-07-17 DIAGNOSIS — R053 Chronic cough: Secondary | ICD-10-CM | POA: Diagnosis not present

## 2023-07-17 DIAGNOSIS — Z6841 Body Mass Index (BMI) 40.0 and over, adult: Secondary | ICD-10-CM

## 2023-07-17 DIAGNOSIS — E66813 Obesity, class 3: Secondary | ICD-10-CM

## 2023-07-17 MED ORDER — TOPIRAMATE 25 MG PO CPSP: 25.0000 mg | ORAL_CAPSULE | Freq: Every day | ORAL | 1 refills | Status: DC

## 2023-07-17 MED ORDER — BREZTRI AEROSPHERE 160-9-4.8 MCG/ACT IN AERO: 2.0000 | INHALATION_SPRAY | Freq: Two times a day (BID) | RESPIRATORY_TRACT | 1 refills | Status: DC

## 2023-07-17 MED ORDER — PHENTERMINE HCL 37.5 MG PO TABS: 37.5000 mg | ORAL_TABLET | Freq: Every day | ORAL | 1 refills | Status: DC

## 2023-07-17 NOTE — Assessment & Plan Note (Addendum)
 Is in for follow-up of her obesity.  Patient reports that she has gained weight since stopping her weight loss medication of phentermine  around Thanksgiving.  Patient notes she stopped because she was not exercising I did not think she did get benefit from medication.  Patient noting significant weight gain since then, would like to restart medication.  Patient tolerated medication well with no issues, patient notes she started phentermine  yesterday, blood pressure acceptable today.  Will continue phentermine  and topiramate . - Continue phentermine  37.5 mg - Continue topiramate  25 mg daily - Follow-up with PCP in 1 month - Consider Contrave at next visit (welbutrin/Naltrexone)

## 2023-07-17 NOTE — Progress Notes (Signed)
  SUBJECTIVE:   CHIEF COMPLAINT / HPI:   Medication refill  The patient presents with a request to restart phentermine  for weight loss management.  They have experienced weight gain over the winter and are seeking to restart phentermine , which they have previously used for weight loss. Phentermine  has been beneficial for them in terms of increasing energy, improving water intake, and controlling appetite.  Previously, they obtained phentermine  from a weight loss clinic in [place] before it became available through their doctor's office, which is more convenient due to insurance coverage. They last used phentermine  before Thanksgiving, having stopped because they were not exercising and felt it was not effective without physical activity. They had a prescription filled on October 2nd and stopped using it before Thanksgiving, leaving six pills unused. They had no side effects from the medicine.   PERTINENT  PMH / PSH:   OBJECTIVE:  BP 133/74   Pulse 79   Ht 5\' 7"  (1.702 m)   Wt 261 lb 9.6 oz (118.7 kg)   LMP 06/05/2023   SpO2 100%   BMI 40.97 kg/m  Physical Exam Constitutional:      General: She is not in acute distress.    Appearance: Normal appearance. She is not ill-appearing.  Cardiovascular:     Rate and Rhythm: Normal rate and regular rhythm.     Pulses: Normal pulses.     Heart sounds: Normal heart sounds. No murmur heard.    No friction rub. No gallop.  Pulmonary:     Effort: Pulmonary effort is normal. No respiratory distress.     Breath sounds: Normal breath sounds. No stridor. No wheezing, rhonchi or rales.  Neurological:     Mental Status: She is alert.      ASSESSMENT/PLAN:   Assessment & Plan Class 3 severe obesity with body mass index (BMI) of 40.0 to 44.9 in adult, unspecified obesity type, unspecified whether serious comorbidity present [facility]) Is in for follow-up of her obesity.  Patient reports that she has gained weight since stopping her weight loss  medication of phentermine  around Thanksgiving.  Patient notes she stopped because she was not exercising I did not think she did get benefit from medication.  Patient noting significant weight gain since then, would like to restart medication.  Patient tolerated medication well with no issues, patient notes she started phentermine  yesterday, blood pressure acceptable today.  Will continue phentermine  and topiramate . - Continue phentermine  37.5 mg - Continue topiramate  25 mg daily - Follow-up with PCP in 1 month - Consider Contrave at next visit (welbutrin/Naltrexone) No follow-ups on file. Christina Harbour, MD 07/17/2023, 9:37 AM PGY-3, [facility]

## 2023-07-17 NOTE — Patient Instructions (Signed)
 It was great to see you! Thank you for allowing me to participate in your care!  I recommend that you always bring your medications to each appointment as this makes it easy to ensure we are on the correct medications and helps us  not miss when refills are needed.  Our plans for today:  - Weight Loss  Restart phentermine  and topiramate  daily for weight loss  Follow up with PCP in one month  Take care and seek immediate care sooner if you develop any concerns.   Dr. Wilhemena Harbour, MD [facility]

## 2023-07-22 ENCOUNTER — Other Ambulatory Visit: Payer: Self-pay | Admitting: Student

## 2023-08-06 ENCOUNTER — Ambulatory Visit: Admitting: Student

## 2023-08-06 NOTE — Progress Notes (Deleted)
    SUBJECTIVE:   CHIEF COMPLAINT / HPI:   Has been seen by [facility], last visit 01/2022. Got pulmonary function tests at that time which were read as normal, but also reported her lung age to be in the 34s. On review of spirometry, her FEV and FEV1 were both ~80% of predicted. No bronchodilator challenge performed. Per review of Dr. Jacqui Mau notes, it seems he was most suspicious of severe allergic symptoms being the driver here.   PERTINENT  PMH / PSH: ***  OBJECTIVE:   LMP 06/05/2023   ***  ASSESSMENT/PLAN:   Assessment & Plan Prediabetes      J Lark Plum, MD [facility]

## 2023-08-07 ENCOUNTER — Ambulatory Visit: Payer: Self-pay | Admitting: Family Medicine

## 2023-08-07 ENCOUNTER — Ambulatory Visit (INDEPENDENT_AMBULATORY_CARE_PROVIDER_SITE_OTHER): Admitting: Internal Medicine

## 2023-08-07 ENCOUNTER — Encounter: Payer: Self-pay | Admitting: Internal Medicine

## 2023-08-07 ENCOUNTER — Ambulatory Visit (INDEPENDENT_AMBULATORY_CARE_PROVIDER_SITE_OTHER)

## 2023-08-07 VITALS — BP 139/86 | HR 88 | Temp 98.4°F | Ht 67.0 in | Wt 256.0 lb

## 2023-08-07 DIAGNOSIS — R053 Chronic cough: Secondary | ICD-10-CM

## 2023-08-07 MED ORDER — PREDNISONE 10 MG PO TABS: ORAL_TABLET | ORAL | 0 refills | Status: DC

## 2023-08-07 MED ORDER — BUDESONIDE-FORMOTEROL FUMARATE 160-4.5 MCG/ACT IN AERO: INHALATION_SPRAY | RESPIRATORY_TRACT | 12 refills | Status: AC

## 2023-08-07 NOTE — Telephone Encounter (Signed)
 Productive cough x2 weeks  Symptoms: Wheezing, chest congestion with rattling, yellow/brown mucus, intermittent chest tightness, mild SOB (with movement), runny nose in morning Pertinent Negatives: Patient denies hemoptysis, fever  Disposition:  [x] Appointment (In office)  Additional Notes: Patient states she was given antibiotics for this a few months ago and it came back. Patient scheduled for appointment today.   Copied from CRM [id number]. Topic: Clinical - Red Word Triage >> Aug 07, 2023 10:03 AM Crist Dominion wrote: Red Word that prompted transfer to Nurse Triage: New cough, wheezing, brown mucus, and chest rattle feeling. Reason for Disposition  [1] MILD difficulty breathing (e.g., minimal/no SOB at rest, SOB with walking, pulse <100) AND [2] still present when not coughing  Answer Assessment - Initial Assessment Questions ONSET: "When did the cough begin?"      Two weeks ago SPUTUM: "Describe the color of your sputum" (none, dry cough; clear, white, yellow, green)     Yellow brown HEMOPTYSIS: "Are you coughing up any blood?" If so ask: "How much?" (flecks, streaks, tablespoons, etc.)     No DIFFICULTY BREATHING: "Are you having difficulty breathing?" If Yes, ask: "How bad is it?" (e.g., mild, moderate, severe)    - MILD: No SOB at rest, mild SOB with walking, speaks normally in sentences, can lie down, no retractions, pulse < 100.    - MODERATE: SOB at rest, SOB with minimal exertion and prefers to sit, cannot lie down flat, speaks in phrases, mild retractions, audible wheezing, pulse 100-120.    - SEVERE: Very SOB at rest, speaks in single words, struggling to breathe, sitting hunched forward, retractions, pulse > 120      Mild, with movement FEVER: "Do you have a fever?" If Yes, ask: "What is your temperature, how was it measured, and when did it start?"     No OTHER SYMPTOMS: "Do you have any other symptoms?" (e.g., runny nose, wheezing, chest pain)       Wheezing, chest  tightness intermittent  Protocols used: Cough - Acute Productive-A-AH

## 2023-08-07 NOTE — Assessment & Plan Note (Signed)
 Onset in her 40s lasting months at a time then up to a year s symptoms or need for meds/ worse since 2020 covid (neg testing) - cough worse on use of saba/trelegy  01/25/2022  - FENO 01/25/2022 = 14 - Spirometry 01/25/2022  nl off trelegy x 3 days  - Allergy screen 01/25/2022 >  Eos 0. 3/  IgE  1502 > referred to allergy 02/08/2022  and again 08/07/2023  - cyclical cough rx 01/25/2022 and off inhalers > did not follow instructions - 02/08/2022 repeat cough protocol but use tessalon  instead of tramadol  and added  1st gen H1 blockers per guidelines  at hs for noct cough component  - 08/07/2023  After extensive coaching inhaler device,  effectiveness =    nearly 0% effective baseline > try symbicort 160 2bid and if can't tol due to cough then do budesonide 0.25 mg bid with albuterol , the equivalent of air supra in neb form.   Comment: DDX of  difficult airways management almost all start with A and  include Adherence, Ace Inhibitors, Acid Reflux, Active Sinus Disease, Alpha 1 Antitripsin deficiency, Anxiety masquerading as Airways dz,  ABPA,  Allergy (esp in young), Aspiration (esp in elderly), Adverse effects of meds,  Active smoking or vaping, A bunch of PE's (a small clot burden can't cause this syndrome unless there is already severe underlying pulm or vascular dz with poor reserve) plus two Bs  = Bronchiectasis and Beta blocker use..and one C= CHF   Most concerning are the bolded dx's  Adherence is always the initial "prime suspect" and is a multilayered concern that requires a "trust but verify" approach in every patient - starting with knowing how to use medications, especially inhalers, correctly, keeping up with refills and understanding the fundamental difference between maintenance and prns vs those medications only taken for a very short course and then stopped and not refilled.  - see hfa teaching  - return with all meds in hand using a trust but verify approach to confirm accurate Medication   Reconciliation The principal here is that until we are certain that the  patients are doing what we've asked, it makes no sense to ask them to do more.   ? Acid (or non-acid) GERD > always difficult to exclude as up to 75% of pts in some series report no assoc GI/ Heartburn symptoms> rec max (24h)  acid suppression and diet restrictions/ reviewed and instructions given in writing.   ? Allergy/ABPA  > pos IgE noted > pred x 6 d, high dose ICS and refer to allergy again    F/u 4 weeks until established with allergy.     Each maintenance medication was reviewed in detail including emphasizing most importantly the difference between maintenance and prns and under what circumstances the prns are to be triggered using an action plan format where appropriate.  Total time for H and P, chart review, counseling, reviewing hfa/neb device(s) and generating customized AVS unique to this office visit / same day charting = 40 min acute eval with poorly controlled  pt not seen in > 1 y

## 2023-08-07 NOTE — Progress Notes (Signed)
 Christina Sparks, female    DOB: 18-Jun-1970   MRN: [id number]   Brief patient profile:  3 yobf  never smoker referred to pulmonary clinic 01/25/2022 by Otho Blitz for refractory cough  With a pattern of cough that lasts months to a year starting  in her 3s ? Responsive to [facility] well for up to a year and then worse "with the cold weather" but  since  the year of covid 19 came and stayed though never tested pos for covid and no response to trelegy   Baseline wt 215 in her 40s  peak was 278   History of Present Illness  01/25/2022  Pulmonary/ 1st office eval/Christina Sparks  Chief Complaint  Patient presents with   Consult  Dyspnea:  not aerobically active but loses breath to speak/not walk   Cough: yellowish better at bedtime as long as rotates off the L side / sma SABA use: not helpful, makes her cough  L cp with cough rad front to back recurrent x years  same pattern with neg ct x 2, one of which CTa  06/14/19 Rec Mucinex dm 1200 mg twice daily and supplement with tramadol  50 mg  1 every 4 hours as needed Prednisone  10 mg take  4 each am x 2 days,   2 each am x 2 days,  1 each am x 2 days and stop  Pantoprazole  (protonix ) 40 mg   Take  30-60 min before first meal of the day and Pepcid  (famotidine )  20 mg after supper until return to office - GERD diet reviewed, bed blocks rec   lab and x-ray department  for your tests - we will call you with the results when they are available. Please schedule a follow up office visit in 2  weeks, sooner if needed    Allergy screen 01/25/2022 >  Eos 0. 3/  IgE  1502   02/08/2022  f/u ov/Christina Sparks re: cough maint on no rx   Chief Complaint  Patient presents with   Follow-up    Feeling better 85%. Cough-yellow, not as hoarse, slight sob  Dyspnea:  loses breath to speak, not walk  Cough: bad cough each am slt yellowish mucus  Sleeping: flat bed ok for a few hours then wakes up cough w/in a few hours q noct assoc with thorat tickle  SABA use: none  02: none  Covid  status:  vax one injections REC For cough tessalon  pearls every 6 hours as needed  Prednisone  10 mg take  4 each am x 2 days,   2 each am x 2 days,  1 each am x 2 days and stop  Pantoprazole  (protonix ) 40 mg   Take  30-60 min before first meal of the day and Pepcid  (famotidine )  20 mg after supper  for a full  3 months  For drainage / throat tickle try take CHLORPHENIRAMINE  4 mg  ("Allergy Relief" 4mg   at [facility] should be easiest to find in the blue box usually on bottom shelf)  take   start with just a dose or two an hour before bedtime. My office will be contacting you by phone for referral to allergy testing   - if you don't hear back from my office within one week please call us  back or notify us  thru MyChart and we'll address it right away.    08/07/2023  f/u ov/Christina Sparks re: cough  x 2 weeks  "maint" only  on multiple forms of saba  Chief Complaint  Patient presents with   Acute Visit    Increased cough x 2 wks. She is producing some yellow to brown sputum.     No obvious day to day or daytime variability or assoc  mucus plugs or hemoptysis or cp or chest tightness, subjective wheeze or overt sinus or hb symptoms.    Also denies any obvious fluctuation of symptoms with weather or environmental changes or other aggravating or alleviating factors except as outlined above   No unusual exposure hx or h/o childhood pna/ asthma or knowledge of premature birth.  Current Allergies, Complete Past Medical History, Past Surgical History, Family History, and Social History were reviewed in [employer] record.  ROS  The following are not active complaints unless bolded Hoarseness, sore throat, dysphagia, dental problems, itching, sneezing,  nasal congestion or discharge of excess mucus or purulent secretions, ear ache,   fever, chills, sweats, unintended wt loss or wt gain, classically pleuritic or exertional cp,  orthopnea pnd or arm/hand swelling  or leg swelling,  presyncope, palpitations, abdominal pain, anorexia, nausea, vomiting, diarrhea  or change in bowel habits or change in bladder habits, change in stools or change in urine, dysuria, hematuria,  rash, arthralgias, visual complaints, headache, numbness, weakness or ataxia or problems with walking or coordination,  change in mood or  memory.        Current Meds  Medication Sig   albuterol  (PROVENTIL ) (2.5 MG/3ML) 0.083% nebulizer solution Take 3 mLs (2.5 mg total) by nebulization every 6 (six) hours as needed for wheezing or shortness of breath.   albuterol  (VENTOLIN  HFA) 108 (90 Base) MCG/ACT inhaler INHALE 1-2 PUFFS INTO THE LUNGS EVERY 4 HOURS AS NEEDED FOR WHEEZING OR SHORTNESS OF BREATH.   fexofenadine  ([employer] ALLERGY RELIEF) 60 MG tablet TAKE 1 TABLET BY MOUTH 2 TIMES DAILY.   phentermine  (ADIPEX-P ) 37.5 MG tablet Take 1 tablet (37.5 mg total) by mouth daily before breakfast.         Past Medical History:  Diagnosis Date   Anemia    Back pain    Knee pain    Migraines    S/P bilateral breast reduction    S/P tubal ligation      Objective:    WTS   08/07/2023       256   02/08/22 250 lb 6.4 oz (113.6 kg)  01/18/22 245 lb 12.8 oz (111.5 kg)  01/04/22 245 lb (111.1 kg)     Vital signs reviewed  08/07/2023  - Note at rest 02 sats  100% on ra   General appearance:    amb bf with extremely harsh dry sounding cough    HEENT : Oropharynx  clear      Nasal turbinates nl    NECK :  without  apparent JVD/ palpable Nodes/TM    LUNGS: no acc muscle use,  Nl contour chest with minimal insp/ exp rhonchi    CV:  RRR  no s3 or murmur or increase in P2, and no edema   ABD:  soft and nontender   MS:  Gait nl   ext warm without deformities Or obvious joint restrictions  calf tenderness, cyanosis or clubbing    SKIN: warm and dry without lesions    NEURO:  alert, approp, nl sensorium with  no motor or cerebellar deficits apparent.     CXR PA and Lateral:   08/07/2023 :    I personally  reviewed images and impression is as follows:  Slt reduced lung volumes, no infiltrates             Assessment

## 2023-08-07 NOTE — Patient Instructions (Addendum)
 My office will be contacting you by phone for referral to Allergy   - if you don't hear back from my office within one week please call us  back or notify us  thru MyChart and we'll address it right away.   Try prilosec otc 20mg   Take 30-60 min before first meal of the day and Pepcid  ac (famotidine ) 20 mg one @  bedtime until cough is completely gone for at least a week without the need for cough suppression     For cough >   Mucinex dm 1200 mg every 12 hours  as needed  Prednisone  10 mg take  4 each am x 2 days,   2 each am x 2 days,  1 each am x 2 days and stop      Plan A = Automatic = Always=    Symbicort 160 (Breyna 160 ) Take 2 puffs first thing in am and then another 2 puffs about 12 hours later.    Work on inhaler technique:  relax and gently blow all the way out then take a nice smooth full deep breath back in, triggering the inhaler at same time you start breathing in.  Hold breath in for at least  5 seconds if you can. Blow out symbicort  thru nose. Rinse and gargle with water when done.  If mouth or throat bother you at all,  try brushing teeth/gums/tongue with arm and hammer toothpaste/ make a slurry and gargle and spit out.      Plan B = Backup (to supplement plan A, not to replace it) Only use your albuterol  inhaler as a rescue medication to be used if you can't catch your breath by resting or doing a relaxed purse lip breathing pattern.  - The less you use it, the better it will work when you need it. - Ok to use the inhaler up to 2 puffs  every 4 hours if you must but call for appointment if use goes up over your usual need - Don't leave home without it !!  (think of it like the spare tire for your car)    Plan C = Crisis (instead of Plan B but only if Plan B stops working) - only use your albuterol  nebulizer if you first try Plan B and it fails to help > ok to use the nebulizer up to every 4 hours but if start needing it regularly call for immediate appointment   Please  remember to go to the  x-ray department  for your tests - we will call you with the results when they are available    Please schedule a follow up office visit in 4 weeks, sooner if needed  with all medications /inhalers/ solutions in hand so we can verify exactly what you are taking. This includes all medications from all doctors and over the counters

## 2023-08-08 ENCOUNTER — Ambulatory Visit: Payer: Self-pay | Admitting: Internal Medicine

## 2023-08-08 NOTE — Progress Notes (Signed)
Called the pt and there was no answer- left detailed msg with results ok per DPR

## 2023-08-09 ENCOUNTER — Telehealth: Payer: Self-pay

## 2023-08-09 ENCOUNTER — Other Ambulatory Visit (HOSPITAL_COMMUNITY): Payer: Self-pay

## 2023-08-09 NOTE — Telephone Encounter (Signed)
*  Pulm  Pharmacy Patient Advocate Encounter   Received notification from CoverMyMeds that prior authorization for Symbicort 160-4.5MCG/ACT aerosol  is required/requested.   Insurance verification completed.   The patient is insured through [employer] .   Per test claim: The current 30 day co-pay is, $201.47.  No PA needed at this time. This test claim was processed through [facility]- copay amounts may vary at other pharmacies due to pharmacy/plan contracts, or as the patient moves through the different stages of their insurance plan.

## 2023-08-11 ENCOUNTER — Other Ambulatory Visit: Payer: Self-pay | Admitting: Student

## 2023-08-11 DIAGNOSIS — R053 Chronic cough: Secondary | ICD-10-CM

## 2023-08-14 ENCOUNTER — Telehealth (HOSPITAL_BASED_OUTPATIENT_CLINIC_OR_DEPARTMENT_OTHER): Payer: Self-pay

## 2023-08-14 DIAGNOSIS — R053 Chronic cough: Secondary | ICD-10-CM

## 2023-08-14 NOTE — Telephone Encounter (Signed)
 Prednisone  10 mg take  4 each am x 2 days,   2 each am x 2 days,  1 each am x 2 days and stop   Wixella 250 cheapest= one puff bid

## 2023-08-14 NOTE — Telephone Encounter (Signed)
  Reason for CRM: Pt stated she just completed predniSONE  (DELTASONE ) 10 MG tablet, however she is still experiencing the same symptoms with coughing and having mucus coughed up. Pt would like to know if she needs another round of predniSONE  (DELTASONE ) 10 MG tablet sent in or another medication to assist. [phone].               Copied from CRM [id number]. Topic: Clinical - Prescription Issue >> Aug 14, 2023  2:11 PM Tyronne Galloway wrote: Reason for CRM: Pt stated she went to her preferred pharmacy at [facility] on [address] for budesonide -formoterol  (SYMBICORT ) 160-4.5 MCG/ACT inhaler, however her insurance does not cover this medication. Pt alsos stated the generic version of this medication is still too expensive being $170+ for the medication. Pt would like to know if there's options for an alternative if possible. Please call the pt back at [phone] ok to leave a vm.

## 2023-08-16 MED ORDER — PREDNISONE 10 MG PO TABS: ORAL_TABLET | ORAL | 0 refills | Status: DC

## 2023-08-16 MED ORDER — FLUTICASONE-SALMETEROL 250-50 MCG/ACT IN AEPB: 1.0000 | INHALATION_SPRAY | Freq: Two times a day (BID) | RESPIRATORY_TRACT | 11 refills | Status: AC

## 2023-08-16 NOTE — Addendum Note (Signed)
 Addended by: Lorea Kupfer M on: 08/16/2023 01:03 PM   Modules accepted: Orders

## 2023-08-16 NOTE — Telephone Encounter (Signed)
 Spoke with the pt and notified of response from Dr. Waymond Hailey  She verbalized understanding  Pred and Wixella both sent to preferred pharm  Nothing further needed

## 2023-08-31 NOTE — Progress Notes (Deleted)
 Christina Sparks, female    DOB: 20-Dec-1970   MRN: [id number]   Brief patient profile:  11 yobf  never smoker referred to pulmonary clinic 01/25/2022 by Christina Sparks for refractory cough  With a pattern of cough that lasts months to a year starting  in her 52s ? Responsive to [name] well for up to a year and then worse "with the cold weather" but  since  the year of covid 19 came and stayed though never tested pos for covid and no response to trelegy   Baseline wt 215 in her 40s  peak was 278   History of Present Illness  01/25/2022  Pulmonary/ 1st office eval/Christina Sparks  Chief Complaint  Patient presents with   Consult  Dyspnea:  not aerobically active but loses breath to speak/not walk   Cough: yellowish better at bedtime as long as rotates off the L side / sma SABA use: not helpful, makes her cough  L cp with cough rad front to back recurrent x years  same pattern with neg ct x 2, one of which CTa  06/14/19 Rec Mucinex dm 1200 mg twice daily and supplement with tramadol  50 mg  1 every 4 hours as needed Prednisone  10 mg take  4 each am x 2 days,   2 each am x 2 days,  1 each am x 2 days and stop  Pantoprazole  (protonix ) 40 mg   Take  30-60 min before first meal of the day and Pepcid  (famotidine )  20 mg after supper until return to office - GERD diet reviewed, bed blocks rec   lab and x-ray department  for your tests - we will call you with the results when they are available. Please schedule a follow up office visit in 2  weeks, sooner if needed    Allergy screen 01/25/2022 >  Eos 0. 3/  IgE  1502   02/08/2022  f/u ov/Christina Sparks re: cough maint on no rx   Chief Complaint  Patient presents with   Follow-up    Feeling better 85%. Cough-yellow, not as hoarse, slight sob  Dyspnea:  loses breath to speak, not walk  Cough: bad cough each am slt yellowish mucus  Sleeping: flat bed ok for a few hours then wakes up cough w/in a few hours q noct assoc with thorat tickle  SABA use: none  02: none  Covid  status:  vax one injections REC For cough tessalon  pearls every 6 hours as needed  Prednisone  10 mg take  4 each am x 2 days,   2 each am x 2 days,  1 each am x 2 days and stop  Pantoprazole  (protonix ) 40 mg   Take  30-60 min before first meal of the day and Pepcid  (famotidine )  20 mg after supper  for a full  3 months  For drainage / throat tickle try take CHLORPHENIRAMINE  4 mg  ("Allergy Relief" 4mg   at [facility] should be easiest to find in the blue box usually on bottom shelf)  take   start with just a dose or two an hour before bedtime. My office will be contacting you by phone for referral to allergy testing   - if you don't hear back from my office within one week please call us  back or notify us  thru MyChart and we'll address it right away.    08/07/2023  f/u ov/Christina Sparks re: cough  x 2 weeks  "maint" only  on multiple forms of saba  Chief Complaint  Patient presents with   Acute Visit    Increased cough x 2 wks. She is producing some yellow to brown sputum.    Rec My office will be contacting you by phone for referral to Allergy   - if you don't hear back from my office within one week please call us  back or notify us  thru MyChart and we'll address it right away.  Try prilosec otc 20mg   Take 30-60 min before first meal of the day and Pepcid  ac (famotidine ) 20 mg one @  bedtime until cough is completely gone for at least a week without the need for cough suppression For cough >   Mucinex dm 1200 mg every 12 hours  as needed Prednisone  10 mg take  4 each am x 2 days,   2 each am x 2 days,  1 each am x 2 days and stop  Plan A = Automatic = Always=    Symbicort  160 (Breyna  160 ) Take 2 puffs first thing in am and then another 2 puffs about 12 hours later.   Work on Printmaker B = Backup (to supplement plan A, not to replace it) Only use your albuterol  inhaler as a rescue medication Plan C = Crisis (instead of Plan B but only if Plan B stops working) - only use your  albuterol  nebulizer if you first try Plan B   Cxr : wnl  Please schedule a follow up office visit in 4 weeks, sooner if needed  with all medications /inhalers/ solutions in hand     09/02/2023  f/u ov/Christina Sparks re: ***   maint on *** did *** bring meds  No chief complaint on file.  Dyspnea:  *** Cough: *** Sleeping: *** resp cc  SABA use: *** 02: ***  Lung cancer screening :  ***    No obvious day to day or daytime variability or assoc excess/ purulent sputum or mucus plugs or hemoptysis or cp or chest tightness, subjective wheeze or overt sinus or hb symptoms.    Also denies any obvious fluctuation of symptoms with weather or environmental changes or other aggravating or alleviating factors except as outlined above   No unusual exposure hx or h/o childhood pna/ asthma or knowledge of premature birth.  Current Allergies, Complete Past Medical History, Past Surgical History, Family History, and Social History were reviewed in [employer] record.  ROS  The following are not active complaints unless bolded Hoarseness, sore throat, dysphagia, dental problems, itching, sneezing,  nasal congestion or discharge of excess mucus or purulent secretions, ear ache,   fever, chills, sweats, unintended wt loss or wt gain, classically pleuritic or exertional cp,  orthopnea pnd or arm/hand swelling  or leg swelling, presyncope, palpitations, abdominal pain, anorexia, nausea, vomiting, diarrhea  or change in bowel habits or change in bladder habits, change in stools or change in urine, dysuria, hematuria,  rash, arthralgias, visual complaints, headache, numbness, weakness or ataxia or problems with walking or coordination,  change in mood or  memory.        No outpatient medications have been marked as taking for the 09/02/23 encounter (Appointment) with Christina Formica, MD.         Past Medical History:  Diagnosis Date   Anemia    Back pain    Knee pain    Migraines    S/P  bilateral breast reduction    S/P tubal ligation      Objective:  WTS   09/02/2023          ***  08/07/2023       256   02/08/22 250 lb 6.4 oz (113.6 kg)  01/18/22 245 lb 12.8 oz (111.5 kg)  01/04/22 245 lb (111.1 kg)    Vital signs reviewed  09/02/2023  - Note at rest 02 sats  ***% on ***   General appearance:    ***             Assessment

## 2023-09-02 ENCOUNTER — Encounter: Payer: Self-pay | Admitting: Internal Medicine

## 2023-09-02 ENCOUNTER — Ambulatory Visit: Admitting: Internal Medicine

## 2023-09-03 IMAGING — CR DG CHEST 2V
2 series · 2 of 2 positions shown · non-contrast
Comparison: 08/11/2020

CLINICAL DATA: Persistent cough intermittent fevers, asthma
evaluation

EXAM:
CHEST - 2 VIEW

[chest pa]
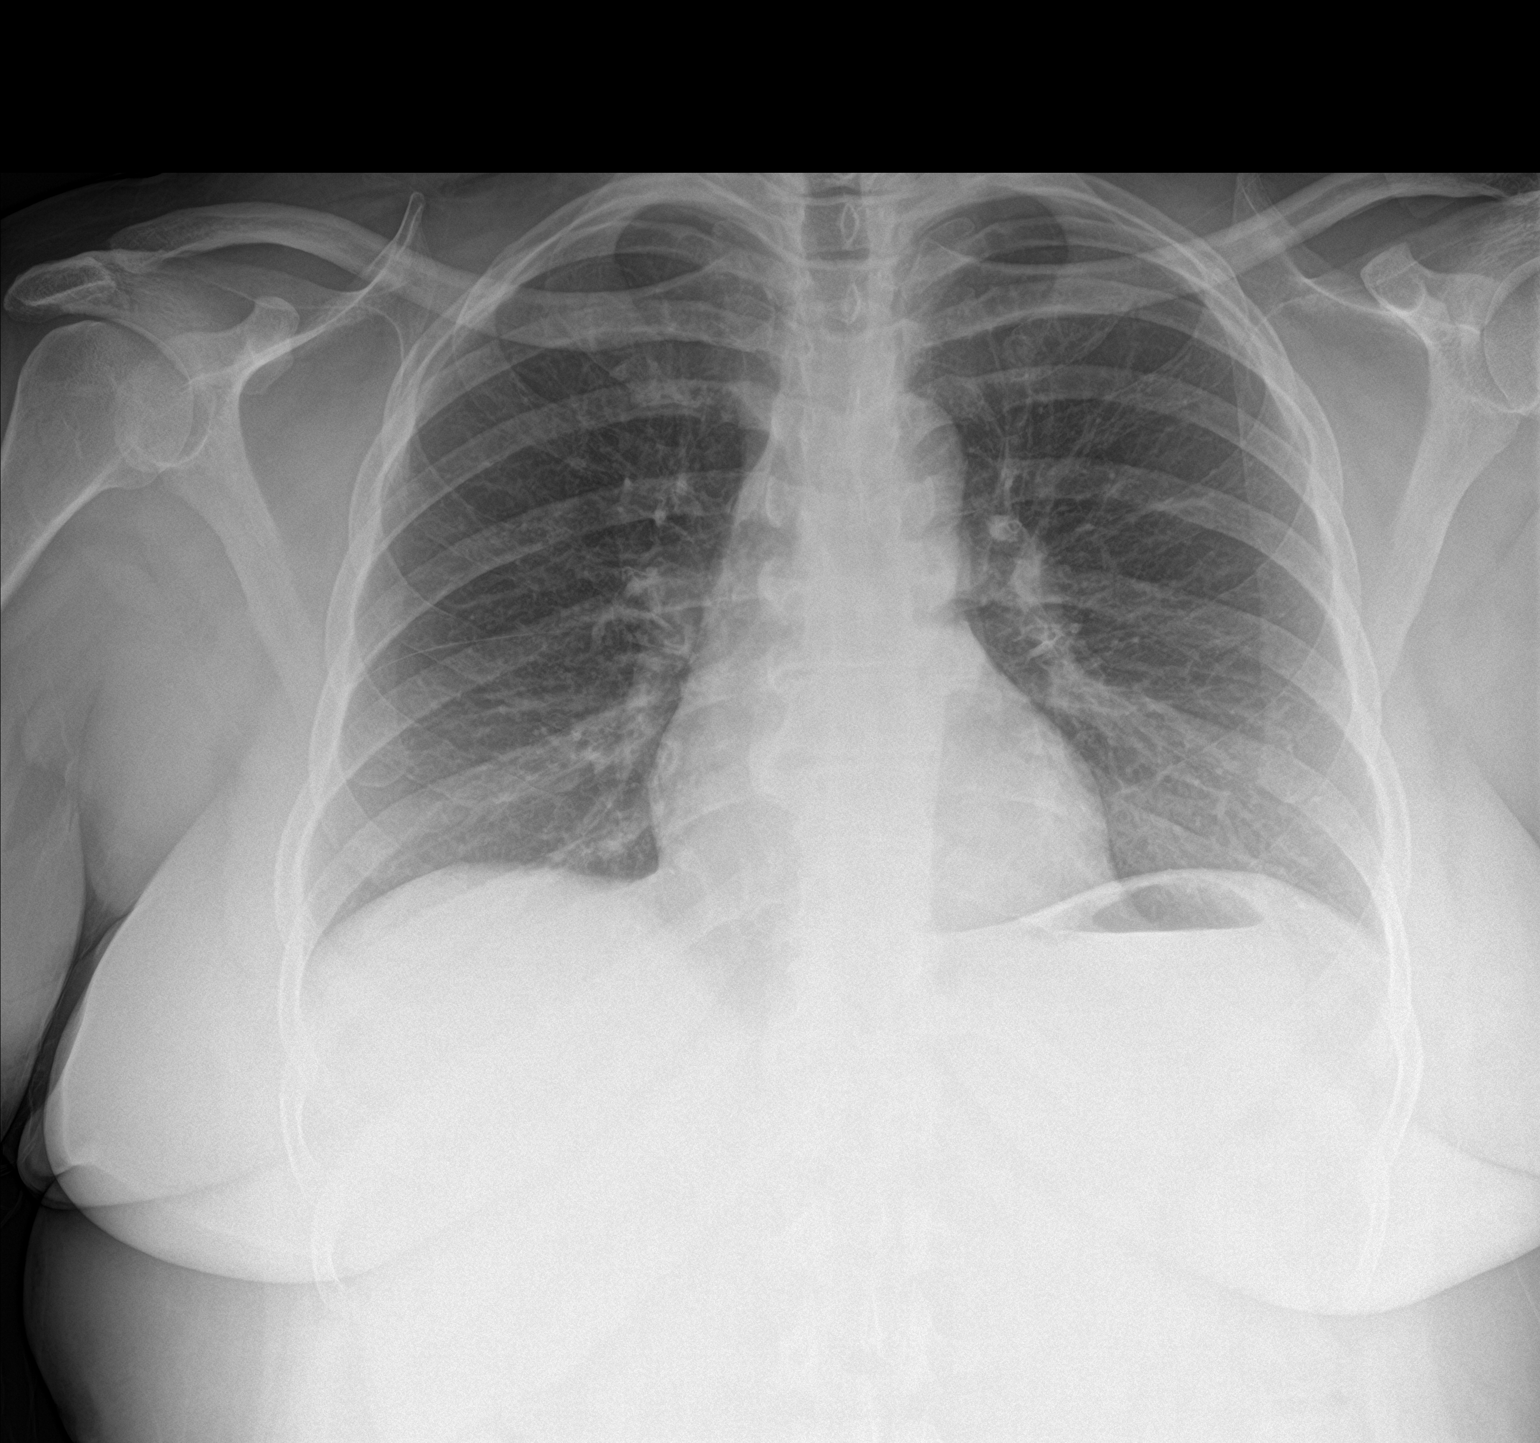

[chest lat]
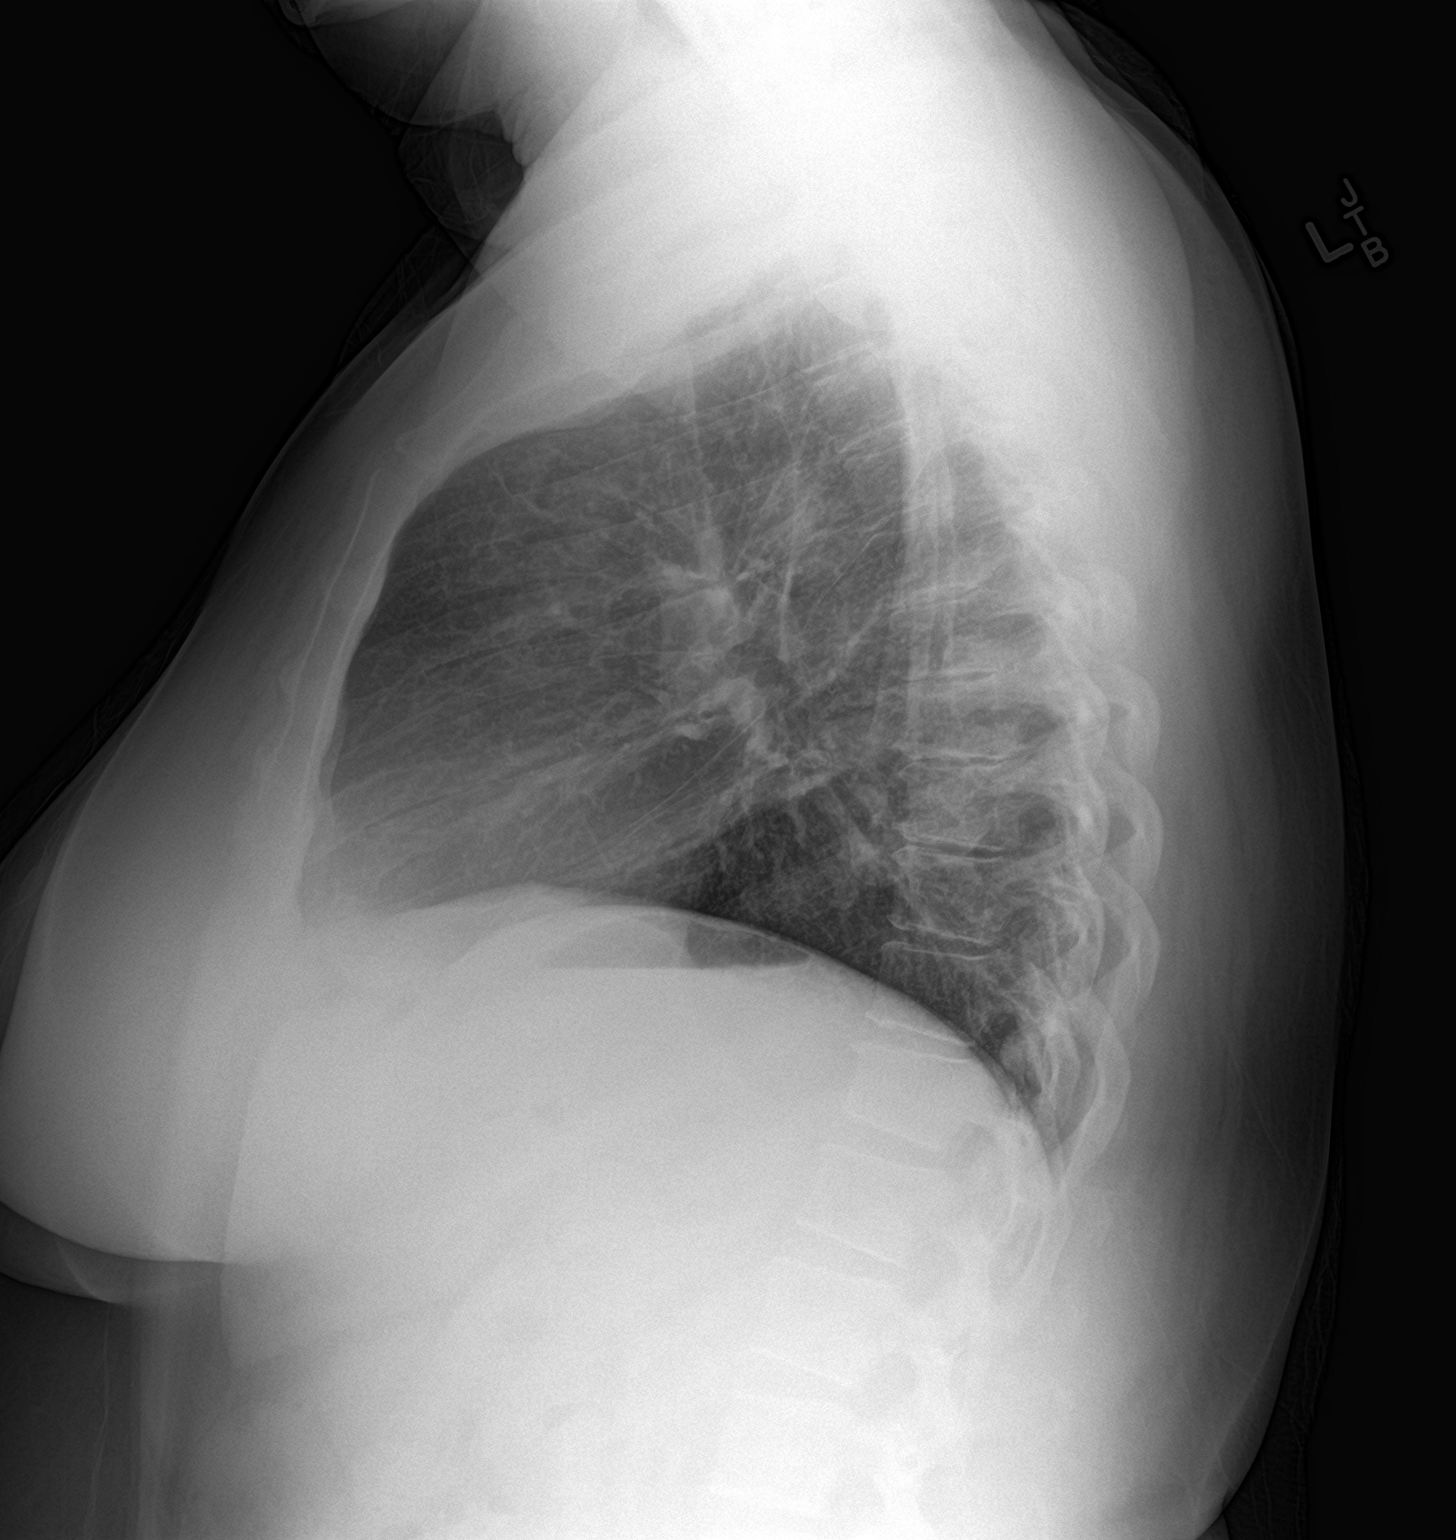

[2 of 2 positions shown; findings below may reference images not displayed]

FINDINGS: The heart size and mediastinal contours are within normal limits.
Both lungs are clear. The visualized skeletal structures are
unremarkable except for degenerative changes of the spine. Trachea
midline.
IMPRESSION: No active cardiopulmonary disease.

## 2023-09-25 NOTE — Progress Notes (Deleted)
    SUBJECTIVE:   CHIEF COMPLAINT / HPI:   Swelling in feet and hands  *Mammogram, colonoscopy, Pap  PERTINENT  PMH / PSH: ***  OBJECTIVE:   There were no vitals taken for this visit. ***  General: NAD, pleasant, able to participate in exam Cardiac: RRR, no murmurs. Respiratory: CTAB, normal effort, No wheezes, rales or rhonchi Abdomen: Bowel sounds present, nontender, nondistended Extremities: no edema or cyanosis. Skin: warm and dry, no rashes noted Neuro: alert, no obvious focal deficits Psych: Normal affect and mood  ASSESSMENT/PLAN:   No problem-specific Assessment & Plan notes found for this encounter.     Dr. Izetta Nap, DO [facility]    {    This will disappear when note is signed, click to select method of visit    :1}

## 2023-09-26 ENCOUNTER — Ambulatory Visit: Admitting: Family Medicine

## 2023-09-26 DIAGNOSIS — I10 Essential (primary) hypertension: Secondary | ICD-10-CM

## 2023-11-18 ENCOUNTER — Encounter: Payer: Self-pay | Admitting: Family Medicine

## 2023-11-18 ENCOUNTER — Other Ambulatory Visit: Payer: Self-pay | Admitting: Family Medicine

## 2023-11-18 ENCOUNTER — Telehealth: Payer: Self-pay

## 2023-11-18 ENCOUNTER — Ambulatory Visit: Admitting: Family Medicine

## 2023-11-18 VITALS — BP 148/95 | HR 78 | Temp 98.2°F | Ht 67.0 in | Wt 260.2 lb

## 2023-11-18 DIAGNOSIS — I10 Essential (primary) hypertension: Secondary | ICD-10-CM

## 2023-11-18 DIAGNOSIS — J018 Other acute sinusitis: Secondary | ICD-10-CM

## 2023-11-18 MED ORDER — DOXYCYCLINE HYCLATE 100 MG PO TABS: 100.0000 mg | ORAL_TABLET | Freq: Two times a day (BID) | ORAL | 0 refills | Status: AC

## 2023-11-18 MED ORDER — AMOXICILLIN-POT CLAVULANATE 875-125 MG PO TABS: 1.0000 | ORAL_TABLET | Freq: Two times a day (BID) | ORAL | 0 refills | Status: DC

## 2023-11-18 MED ORDER — PHENTERMINE HCL 37.5 MG PO TABS
37.5000 mg | ORAL_TABLET | Freq: Every day | ORAL | 1 refills | Status: AC
Start: 2023-11-18 — End: ?

## 2023-11-18 NOTE — Telephone Encounter (Signed)
 Patient calls nurse line requesting to speak with Dr. Diona.   She reports she was just seen and was prescribed Augmentin  for sinus infection. She reports her pharmacy contacted her and stated the prescription would be ready until mid September and is not covered by her insurance.   She reports she would like what has been prescribed to her in the past for past infections. She stated, look in my records. She reports she wants what was given to her by Pulmonology.  It appears she was given prednisone  and doxycycline  in the past. She is requesting these (2) prescriptions.   Advised will forward to provider who saw patient.

## 2023-11-18 NOTE — Progress Notes (Signed)
    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 Resting comfortably in room. HENT: MMM. Non-erythematous, non-bulging TM bilaterally. Normal appearing oropharynx without tonsillar enlargement or exudate. No cervical lymphadenopathy. Erythematous nares. Tenderness to palpation of maxillary sinuses.  CV: Normal S1/S2. No extra heart sounds. Warm and well-perfused. Pulm: Breathing comfortably on room air. CTAB. No increased WOB. Abd: Soft, non-tender, non-distended. Skin:  Warm, dry. Psych: Pleasant and appropriate.    ASSESSMENT/PLAN:   Assessment & Plan Other subacute sinusitis Given timeframe of symptoms, suspect sinusitis with possible underlying infection.  -Discussed abx course - Augmentin  course sent -Discussed how oral steroids are not shown to be very helpful  -Discussed continued nasal spray use  Morbid obesity (HCC) Refilled phentermine  per patient request. Reports being out of this medication for multiple months.  Primary hypertension Elevated on repeat check today 148/95. Patient asymptomatic. Possible component of BP elevation iso of current illness. Precautions discussed. FU at next visit.    RTC in 2 weeks.   Damien Cassis, MD [facility]

## 2023-11-18 NOTE — Progress Notes (Signed)
 Doxycycline  sent for sinusitis, will message patient with further instruction.

## 2023-11-18 NOTE — Patient Instructions (Signed)
 Thank you for visiting clinic today and allowing us  to participate in your care!  Please take the antibiotics as prescribed. Please use Afrin up to twice daily as needed.   Please schedule an appointment in 2 weeks to follow up on your blood pressure.   Reach out any time with any questions or concerns you may have - we are here for you!  Damien Cassis, MD [facility] [phone]

## 2023-11-18 NOTE — Assessment & Plan Note (Signed)
 Elevated on repeat check today 148/95. Patient asymptomatic. Possible component of BP elevation iso of current illness. Precautions discussed. FU at next visit.

## 2023-12-03 ENCOUNTER — Other Ambulatory Visit: Payer: Self-pay

## 2023-12-03 ENCOUNTER — Ambulatory Visit: Admitting: Family Medicine

## 2023-12-03 VITALS — BP 126/83 | HR 84 | Ht 67.0 in | Wt 255.0 lb

## 2023-12-03 DIAGNOSIS — R600 Localized edema: Secondary | ICD-10-CM | POA: Diagnosis not present

## 2023-12-03 DIAGNOSIS — F5104 Psychophysiologic insomnia: Secondary | ICD-10-CM | POA: Diagnosis not present

## 2023-12-03 DIAGNOSIS — D649 Anemia, unspecified: Secondary | ICD-10-CM

## 2023-12-03 DIAGNOSIS — G2581 Restless legs syndrome: Secondary | ICD-10-CM

## 2023-12-03 DIAGNOSIS — R0609 Other forms of dyspnea: Secondary | ICD-10-CM | POA: Diagnosis not present

## 2023-12-03 DIAGNOSIS — F430 Acute stress reaction: Secondary | ICD-10-CM

## 2023-12-03 NOTE — Assessment & Plan Note (Addendum)
 Prior hemoglobin of 11.1 in November 2023.  Now with irregular menstrual periods, likely going through premenopause. If ferritin is low, iron  deficiency anemia could be possible etiology of patient's restlessness at night given her description of inability to lie still.  Additionally, if anemia persists, patient will benefit from colonoscopy soon to rule out malignancy.  Regardless, patient needs to update her regular preventative cancer screenings. - Iron , TIBC, ferritin - CBC

## 2023-12-03 NOTE — Patient Instructions (Addendum)
 It was great to see you today! Thank you for choosing [facility] for your primary care.  Today we addressed: Will fill out your FMLA paperwork when it arrives from work.  Insomnia Please take melatonin nightly to help regulate your sleep cycle. I am checking your iron  levels and blood count to make sure you are not anemic, as this could contribute to restlessness of your legs at night.  Shortness of breath Your EKG is unchanged from before a year ago.  We would like to obtain an echocardiogram or ultrasound of your heart to make sure things are going well.  We are checking some labs today, including metabolic panel heart labs, and blood count as well as iron  panel.  You will get a MyChart message or a letter if results are normal. Otherwise, you will get a call from us .  If you had a referral placed, they will call you to set up an appointment. Please give us  a call if you don't hear back in the next 2 weeks.  You should return to our clinic in 1 month.  Thank you for coming to see us  at [facility] and for the opportunity to care for you! Christina Terik Haughey, MD 12/03/2023, 6:16 PM

## 2023-12-03 NOTE — Progress Notes (Signed)
 SUBJECTIVE:   CHIEF COMPLAINT / HPI:  Christina Sparks is a 53 y.o. female with a pertinent past medical history of prediabetes, HTN, GERD, obesity presenting to the clinic requesting FMLA paperwork completion for acute stress and insomnia.  Acute stress reaction Insomnia Patient is requesting FMLA for mental health reasons, insomnia,  Intermittent  Patient works as an Nature conservation officer at [employer], reports severe stress at work at the moment. 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 She is having to travel for work across nearby states. Patient receives multiple calls from work during this appointment. Reports she is sleeping about 2-4 hours at night. Reports some chest tightness and occasional headaches.  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 Denies PND. Does have some LE edema for several months now, though notes she is no her feet for hours at a time. Does have minimal dyspnea with exertion, somewhat worsened in past few months. When she walks a lot, she gets muscle spasms in her legs, especially at night. She describes lying awake at night with her legs spasming and she is unable to get any rest.   PERTINENT PMH / PSH: Prediabetes, HTN, GERD, obesity Migraines Cervical spine stenosis  *Remainder reviewed in problem list.   OBJECTIVE:   BP 126/83   Pulse 84   Ht 5' 7 (1.702 m)   Wt 255 lb (115.7 kg)   LMP 11/06/2023   SpO2 100%   BMI 39.94 kg/m   General: Age-appropriate, appears mildly anxious, resting in chair, alert. Cardiovascular: Regular rate and rhythm. Normal S1/S2. No murmurs, rubs, or gallops appreciated. 2+ radial pulses. Pulmonary: Clear bilaterally to ascultation. No wheezes, crackles, or rhonchi. Normal  WOB on room air. No accessory muscle use. Abdominal: No tenderness to deep or light palpation. No rebound or guarding. No HSM. Skin: Warm and dry.  No rashes grossly. Extremities: 1+ pitting peripheral edema bilaterally. Capillary refill <2 seconds.   ASSESSMENT/PLAN:   Assessment & Plan Acute stress reaction Psychophysiological insomnia Patient appears significantly stressed from her current work demands and has minimal sleep.  Suspect patient may have anxiety disorder, she declined further counseling or treatment at this time.  Discussed possibility of psychosocial counseling, however patient declined therapy at this time, stating that she would prefer to continue speaking with her pastor and take some time to rest away from work.  Reviewed how FMLA works. - FMLA paperwork will be filled out when her job send over papers, plan for 1 month continuous leave - Recommended nightly melatonin to assist with sleep Dyspnea on exertion Lower extremity edema Unclear etiology with leading differentials including anemia, heart failure, or deconditioning. No history of asthma or COPD patient does not smoke, no evidence of pulmonary infection.  Of note, patient has previously taken Breztri  and Symbicort  solely for chronic cough, but on chart review has no diagnosis of any obstructive pulmonary disease.  If patient continues having dyspnea and workup is unremarkable, would screen for restrictive or obstructive pulmonary pathology. Obtained and reviewed EKG in clinic, which showed NSR with possible RBBB and no acute ischemic changes, similar to prior EKG May 2024. Given the patient's lower extremity edema and dyspnea on exertion, we will pursue echocardiogram and BNP.  If heart failure screening is negative, favor venous insufficiency as most likely etiology for  lower extreme edema. - Transthoracic echocardiogram - CBC as below - BMP - BNP - Recommended compression stockings for lower extremities Anemia,  unspecified type Restless legs Prior hemoglobin of 11.1 in November 2023.  Now with irregular menstrual periods, likely going through premenopause. If ferritin is low, iron  deficiency anemia could be possible etiology of patient's restlessness at night given her description of inability to lie still.  Additionally, if anemia persists, patient will benefit from colonoscopy soon to rule out malignancy.  Regardless, patient needs to update her regular preventative cancer screenings. - Iron , TIBC, ferritin - CBC  Return in about 1 month (around 01/02/2024).  Kelijah Towry Toma, MD [facility]

## 2023-12-04 ENCOUNTER — Ambulatory Visit (HOSPITAL_COMMUNITY)
Admission: RE | Admit: 2023-12-04 | Discharge: 2023-12-04 | Disposition: A | Discharge status: Home / Self Care | Source: Ambulatory Visit | Attending: Family Medicine | Admitting: Family Medicine

## 2023-12-04 LAB — BASIC METABOLIC PANEL WITH GFR
BUN/Creatinine Ratio: 9 (ref 9–23)
BUN: 7 mg/dL (ref 6–24)
CO2: 24 mmol/L (ref 20–29)
Calcium: 9.2 mg/dL (ref 8.7–10.2)
Chloride: 104 mmol/L (ref 96–106)
Creatinine, Ser: 0.79 mg/dL (ref 0.57–1.00)
Glucose: 89 mg/dL (ref 70–99)
Potassium: 3.9 mmol/L (ref 3.5–5.2)
Sodium: 143 mmol/L (ref 134–144)
eGFR: 89 mL/min/1.73 (ref >59)

## 2023-12-04 LAB — IRON AND TIBC
Iron Saturation: 15 % (ref 15–55)
Iron: 55 ug/dL (ref 27–159)
Total Iron Binding Capacity: 373 ug/dL (ref 250–450)
UIBC: 318 ug/dL (ref 131–425)

## 2023-12-04 LAB — CBC
Hematocrit: 41 % (ref 34.0–46.6)
Hemoglobin: 12.5 g/dL (ref 11.1–15.9)
MCH: 22.7 pg — ABNORMAL LOW (ref 26.6–33.0)
MCHC: 30.5 g/dL — ABNORMAL LOW (ref 31.5–35.7)
MCV: 75 fL — ABNORMAL LOW (ref 79–97)
Platelets: 292 x10E3/uL (ref 150–450)
RBC: 5.5 x10E6/uL — ABNORMAL HIGH (ref 3.77–5.28)
RDW: 15.8 % — ABNORMAL HIGH (ref 11.7–15.4)
WBC: 7.6 x10E3/uL (ref 3.4–10.8)

## 2023-12-04 LAB — BRAIN NATRIURETIC PEPTIDE: BNP: 7.4 pg/mL (ref 0.0–100.0)

## 2023-12-04 LAB — FERRITIN: Ferritin: 22 ng/mL (ref 15–150)

## 2023-12-05 ENCOUNTER — Ambulatory Visit (HOSPITAL_COMMUNITY): Payer: Self-pay | Admitting: Family Medicine

## 2023-12-05 DIAGNOSIS — E611 Iron deficiency: Secondary | ICD-10-CM

## 2023-12-05 MED ORDER — FERROUS SULFATE 325 (65 FE) MG PO TABS: 325.0000 mg | ORAL_TABLET | Freq: Every day | ORAL | 0 refills | Status: AC

## 2023-12-05 NOTE — Telephone Encounter (Cosign Needed)
 Patient has iron  deficiency, no anemia. Will plan to start ferrous sulfate  325 mg and vitamin C daily for IDA and possible restless leg syndrome.  Patient does need colonoscopy and Pap smear given iron  deficiency, recommend following up with PCP.  BNP WNL, echocardiogram to be completed.  Called patient, left HIPAA-compliant voicemail to call clinic back.  ______________  If patient calls back, please inform her that she has iron  deficiency, but no anemia. I am [facility] [address] to [facility] and I recommend that she take vitamin C over the counter with those.  This may help with her restlessness at night as well. I recommmend follow up with PCP to discuss preventative screenings like colonoscopy. We are working on approving the echocardiogram and getting that scheduled.

## 2024-01-08 ENCOUNTER — Ambulatory Visit (HOSPITAL_COMMUNITY): Payer: Self-pay
